# Patient Record
Sex: Male | Born: 1935 | Race: Black or African American | Hispanic: No | Marital: Married | Smoking: Former smoker
Health system: Southern US, Community
[De-identification: ages and names within clinical notes are randomized; demographics above are authoritative.]

## PROBLEM LIST (undated history)

## (undated) DIAGNOSIS — N182 Chronic kidney disease, stage 2 (mild): Secondary | ICD-10-CM

## (undated) DIAGNOSIS — M069 Rheumatoid arthritis, unspecified: Secondary | ICD-10-CM

## (undated) DIAGNOSIS — I2510 Atherosclerotic heart disease of native coronary artery without angina pectoris: Secondary | ICD-10-CM

## (undated) DIAGNOSIS — D649 Anemia, unspecified: Secondary | ICD-10-CM

## (undated) DIAGNOSIS — I509 Heart failure, unspecified: Secondary | ICD-10-CM

## (undated) DIAGNOSIS — M899 Disorder of bone, unspecified: Secondary | ICD-10-CM

## (undated) DIAGNOSIS — I6529 Occlusion and stenosis of unspecified carotid artery: Secondary | ICD-10-CM

## (undated) DIAGNOSIS — Z8679 Personal history of other diseases of the circulatory system: Secondary | ICD-10-CM

## (undated) DIAGNOSIS — R0602 Shortness of breath: Secondary | ICD-10-CM

## (undated) DIAGNOSIS — I4891 Unspecified atrial fibrillation: Secondary | ICD-10-CM

## (undated) DIAGNOSIS — M949 Disorder of cartilage, unspecified: Secondary | ICD-10-CM

## (undated) DIAGNOSIS — E785 Hyperlipidemia, unspecified: Secondary | ICD-10-CM

## (undated) DIAGNOSIS — I739 Peripheral vascular disease, unspecified: Secondary | ICD-10-CM

## (undated) DIAGNOSIS — I70209 Unspecified atherosclerosis of native arteries of extremities, unspecified extremity: Secondary | ICD-10-CM

## (undated) DIAGNOSIS — C61 Malignant neoplasm of prostate: Secondary | ICD-10-CM

## (undated) DIAGNOSIS — I252 Old myocardial infarction: Secondary | ICD-10-CM

## (undated) DIAGNOSIS — C3490 Malignant neoplasm of unspecified part of unspecified bronchus or lung: Secondary | ICD-10-CM

## (undated) DIAGNOSIS — I639 Cerebral infarction, unspecified: Secondary | ICD-10-CM

## (undated) DIAGNOSIS — Z86010 Personal history of colonic polyps: Secondary | ICD-10-CM

## (undated) HISTORY — DX: Peripheral vascular disease, unspecified: I73.9

## (undated) HISTORY — DX: Chronic kidney disease, stage 2 (mild): N18.2

## (undated) HISTORY — DX: Hyperlipidemia, unspecified: E78.5

## (undated) HISTORY — DX: Unspecified atrial fibrillation: I48.91

## (undated) HISTORY — PX: EMBOLECTOMY: SHX44

## (undated) HISTORY — DX: Personal history of other diseases of the circulatory system: Z86.79

## (undated) HISTORY — PX: CARDIAC ELECTROPHYSIOLOGY STUDY AND ABLATION: SHX1294

## (undated) HISTORY — DX: Cerebral infarction, unspecified: I63.9

## (undated) HISTORY — PX: COLONOSCOPY W/ BIOPSIES AND POLYPECTOMY: SHX1376

## (undated) HISTORY — DX: Malignant neoplasm of unspecified part of unspecified bronchus or lung: C34.90

## (undated) HISTORY — DX: Occlusion and stenosis of unspecified carotid artery: I65.29

## (undated) HISTORY — DX: Unspecified atherosclerosis of native arteries of extremities, unspecified extremity: I70.209

## (undated) HISTORY — DX: Anemia, unspecified: D64.9

## (undated) HISTORY — DX: Disorder of cartilage, unspecified: M94.9

## (undated) HISTORY — DX: Atherosclerotic heart disease of native coronary artery without angina pectoris: I25.10

## (undated) HISTORY — DX: Heart failure, unspecified: I50.9

## (undated) HISTORY — DX: Personal history of colonic polyps: Z86.010

## (undated) HISTORY — DX: Old myocardial infarction: I25.2

## (undated) HISTORY — DX: Disorder of bone, unspecified: M89.9

## (undated) HISTORY — DX: Rheumatoid arthritis, unspecified: M06.9

## (undated) HISTORY — PX: EYE SURGERY: SHX253

## (undated) HISTORY — DX: Malignant neoplasm of prostate: C61

---

## 1993-06-09 HISTORY — PX: ANGIOPLASTY: SHX39

## 2002-01-28 ENCOUNTER — Encounter: Payer: Self-pay | Admitting: *Deleted

## 2002-01-28 ENCOUNTER — Inpatient Hospital Stay (HOSPITAL_COMMUNITY): Admission: AD | Admit: 2002-01-28 | Discharge: 2002-02-05 | Payer: Self-pay | Admitting: *Deleted

## 2002-01-31 ENCOUNTER — Encounter: Payer: Self-pay | Admitting: Surgery

## 2002-01-31 HISTORY — PX: CORONARY ARTERY BYPASS GRAFT: SHX141

## 2002-02-01 ENCOUNTER — Encounter: Payer: Self-pay | Admitting: Surgery

## 2002-02-01 ENCOUNTER — Encounter: Payer: Self-pay | Admitting: Cardiology

## 2002-02-02 ENCOUNTER — Encounter: Payer: Self-pay | Admitting: Surgery

## 2002-02-10 ENCOUNTER — Encounter: Payer: Self-pay | Admitting: Emergency Medicine

## 2002-02-10 ENCOUNTER — Encounter (INDEPENDENT_AMBULATORY_CARE_PROVIDER_SITE_OTHER): Payer: Self-pay | Admitting: Specialist

## 2002-02-10 ENCOUNTER — Inpatient Hospital Stay (HOSPITAL_COMMUNITY): Admission: EM | Admit: 2002-02-10 | Discharge: 2002-02-20 | Payer: Self-pay | Admitting: Emergency Medicine

## 2002-02-11 ENCOUNTER — Encounter: Payer: Self-pay | Admitting: Internal Medicine

## 2002-02-15 ENCOUNTER — Encounter: Payer: Self-pay | Admitting: Vascular Surgery

## 2002-02-22 ENCOUNTER — Encounter: Payer: Self-pay | Admitting: Surgery

## 2002-02-22 ENCOUNTER — Encounter: Admission: RE | Admit: 2002-02-22 | Discharge: 2002-02-22 | Payer: Self-pay | Admitting: Surgery

## 2003-07-04 ENCOUNTER — Encounter: Payer: Self-pay | Admitting: Internal Medicine

## 2003-07-04 DIAGNOSIS — Z860100 Personal history of colon polyps, unspecified: Secondary | ICD-10-CM

## 2003-07-04 HISTORY — DX: Personal history of colonic polyps: Z86.010

## 2003-07-04 HISTORY — DX: Personal history of colon polyps, unspecified: Z86.0100

## 2004-04-09 ENCOUNTER — Ambulatory Visit: Payer: Self-pay

## 2004-04-30 ENCOUNTER — Ambulatory Visit: Payer: Self-pay | Admitting: Internal Medicine

## 2004-05-28 ENCOUNTER — Ambulatory Visit: Payer: Self-pay | Admitting: Internal Medicine

## 2004-06-25 ENCOUNTER — Ambulatory Visit: Payer: Self-pay | Admitting: Internal Medicine

## 2004-07-11 ENCOUNTER — Ambulatory Visit: Payer: Self-pay

## 2004-07-22 ENCOUNTER — Ambulatory Visit: Payer: Self-pay

## 2004-07-23 ENCOUNTER — Ambulatory Visit: Payer: Self-pay | Admitting: Cardiology

## 2004-07-30 ENCOUNTER — Ambulatory Visit: Payer: Self-pay | Admitting: *Deleted

## 2004-08-14 ENCOUNTER — Ambulatory Visit: Payer: Self-pay | Admitting: *Deleted

## 2004-08-14 ENCOUNTER — Ambulatory Visit: Payer: Self-pay | Admitting: Cardiology

## 2004-08-20 ENCOUNTER — Ambulatory Visit: Payer: Self-pay

## 2004-08-26 ENCOUNTER — Ambulatory Visit: Payer: Self-pay | Admitting: Internal Medicine

## 2004-09-05 ENCOUNTER — Ambulatory Visit: Admission: RE | Admit: 2004-09-05 | Discharge: 2004-09-18 | Payer: Self-pay | Admitting: Radiation Oncology

## 2004-09-09 ENCOUNTER — Ambulatory Visit: Payer: Self-pay | Admitting: Cardiology

## 2004-09-30 ENCOUNTER — Ambulatory Visit: Admission: RE | Admit: 2004-09-30 | Discharge: 2004-10-23 | Payer: Self-pay | Admitting: Radiation Oncology

## 2004-09-30 ENCOUNTER — Ambulatory Visit: Payer: Self-pay | Admitting: Cardiology

## 2004-10-28 ENCOUNTER — Ambulatory Visit: Payer: Self-pay | Admitting: Internal Medicine

## 2004-11-25 ENCOUNTER — Ambulatory Visit: Payer: Self-pay | Admitting: Cardiology

## 2004-12-09 ENCOUNTER — Ambulatory Visit: Payer: Self-pay | Admitting: Internal Medicine

## 2004-12-09 ENCOUNTER — Ambulatory Visit: Payer: Self-pay | Admitting: Cardiology

## 2004-12-16 ENCOUNTER — Ambulatory Visit: Payer: Self-pay | Admitting: *Deleted

## 2004-12-18 ENCOUNTER — Ambulatory Visit: Payer: Self-pay | Admitting: Internal Medicine

## 2004-12-23 ENCOUNTER — Ambulatory Visit: Payer: Self-pay | Admitting: Cardiology

## 2005-01-20 ENCOUNTER — Ambulatory Visit: Payer: Self-pay

## 2005-01-28 ENCOUNTER — Ambulatory Visit: Payer: Self-pay | Admitting: Internal Medicine

## 2005-02-03 ENCOUNTER — Ambulatory Visit: Payer: Self-pay | Admitting: Cardiology

## 2005-02-24 ENCOUNTER — Ambulatory Visit: Payer: Self-pay | Admitting: Cardiology

## 2005-03-12 ENCOUNTER — Ambulatory Visit: Payer: Self-pay | Admitting: Internal Medicine

## 2005-03-24 ENCOUNTER — Ambulatory Visit: Payer: Self-pay | Admitting: Internal Medicine

## 2005-03-24 ENCOUNTER — Ambulatory Visit: Payer: Self-pay | Admitting: Cardiology

## 2005-03-31 ENCOUNTER — Ambulatory Visit: Payer: Self-pay | Admitting: Cardiology

## 2005-04-10 ENCOUNTER — Ambulatory Visit: Payer: Self-pay | Admitting: Cardiology

## 2005-04-21 ENCOUNTER — Ambulatory Visit: Payer: Self-pay | Admitting: Cardiology

## 2005-04-23 ENCOUNTER — Ambulatory Visit: Payer: Self-pay | Admitting: Internal Medicine

## 2005-04-24 ENCOUNTER — Ambulatory Visit: Payer: Self-pay | Admitting: Family Medicine

## 2005-05-07 ENCOUNTER — Inpatient Hospital Stay (HOSPITAL_COMMUNITY): Admission: EM | Admit: 2005-05-07 | Discharge: 2005-05-14 | Payer: Self-pay | Admitting: Emergency Medicine

## 2005-05-08 ENCOUNTER — Ambulatory Visit: Payer: Self-pay | Admitting: Cardiology

## 2005-05-09 ENCOUNTER — Ambulatory Visit: Payer: Self-pay | Admitting: Internal Medicine

## 2005-05-09 ENCOUNTER — Encounter: Payer: Self-pay | Admitting: Internal Medicine

## 2005-05-15 ENCOUNTER — Ambulatory Visit: Payer: Self-pay | Admitting: Cardiology

## 2005-05-22 ENCOUNTER — Ambulatory Visit: Payer: Self-pay | Admitting: Cardiology

## 2005-06-05 ENCOUNTER — Ambulatory Visit: Payer: Self-pay | Admitting: *Deleted

## 2005-06-10 ENCOUNTER — Ambulatory Visit: Payer: Self-pay | Admitting: *Deleted

## 2005-06-18 ENCOUNTER — Ambulatory Visit: Payer: Self-pay | Admitting: Internal Medicine

## 2005-06-24 ENCOUNTER — Ambulatory Visit: Payer: Self-pay | Admitting: Internal Medicine

## 2005-07-03 ENCOUNTER — Ambulatory Visit: Payer: Self-pay | Admitting: Cardiology

## 2005-07-15 ENCOUNTER — Ambulatory Visit: Payer: Self-pay

## 2005-07-15 ENCOUNTER — Ambulatory Visit: Payer: Self-pay | Admitting: Internal Medicine

## 2005-08-12 ENCOUNTER — Ambulatory Visit: Payer: Self-pay | Admitting: Cardiology

## 2005-08-13 ENCOUNTER — Ambulatory Visit: Payer: Self-pay | Admitting: Internal Medicine

## 2005-09-09 ENCOUNTER — Ambulatory Visit: Payer: Self-pay | Admitting: Cardiology

## 2005-09-30 ENCOUNTER — Ambulatory Visit: Payer: Self-pay | Admitting: Cardiology

## 2005-10-01 ENCOUNTER — Ambulatory Visit: Payer: Self-pay | Admitting: *Deleted

## 2005-10-29 ENCOUNTER — Ambulatory Visit: Payer: Self-pay | Admitting: Internal Medicine

## 2005-10-29 ENCOUNTER — Ambulatory Visit: Payer: Self-pay | Admitting: *Deleted

## 2005-11-26 ENCOUNTER — Ambulatory Visit: Payer: Self-pay | Admitting: Cardiovascular Disease

## 2005-12-11 ENCOUNTER — Ambulatory Visit: Payer: Self-pay | Admitting: Internal Medicine

## 2005-12-11 ENCOUNTER — Ambulatory Visit: Payer: Self-pay | Admitting: *Deleted

## 2005-12-22 ENCOUNTER — Ambulatory Visit: Payer: Self-pay | Admitting: Internal Medicine

## 2005-12-26 ENCOUNTER — Ambulatory Visit: Payer: Self-pay | Admitting: Cardiology

## 2006-01-16 ENCOUNTER — Ambulatory Visit: Payer: Self-pay | Admitting: Internal Medicine

## 2006-02-13 ENCOUNTER — Ambulatory Visit: Payer: Self-pay | Admitting: Cardiology

## 2006-03-13 ENCOUNTER — Ambulatory Visit: Payer: Self-pay | Admitting: Cardiovascular Disease

## 2006-03-24 ENCOUNTER — Ambulatory Visit: Payer: Self-pay | Admitting: Internal Medicine

## 2006-04-03 ENCOUNTER — Ambulatory Visit: Payer: Self-pay | Admitting: Internal Medicine

## 2006-04-21 ENCOUNTER — Ambulatory Visit: Payer: Self-pay | Admitting: Internal Medicine

## 2006-04-21 LAB — CONVERTED CEMR LAB
Chol/HDL Ratio, serum: 3.8
Cholesterol: 310 mg/dL (ref 0–200)
HDL: 81.3 mg/dL (ref >39.0)
LDL DIRECT: 213.9 mg/dL
Triglyceride fasting, serum: 78 mg/dL (ref 0–149)
VLDL: 16 mg/dL (ref 0–40)

## 2006-05-04 ENCOUNTER — Ambulatory Visit: Payer: Self-pay | Admitting: Cardiology

## 2006-05-28 ENCOUNTER — Ambulatory Visit: Payer: Self-pay | Admitting: Cardiology

## 2006-06-09 DIAGNOSIS — I639 Cerebral infarction, unspecified: Secondary | ICD-10-CM

## 2006-06-09 HISTORY — DX: Cerebral infarction, unspecified: I63.9

## 2006-06-15 ENCOUNTER — Ambulatory Visit: Payer: Self-pay | Admitting: Internal Medicine

## 2006-06-15 LAB — CONVERTED CEMR LAB
ALT: 10 units/L (ref 0–40)
AST: 18 units/L (ref 0–37)
Albumin: 3.5 g/dL (ref 3.5–5.2)
Alkaline Phosphatase: 43 units/L (ref 39–117)
Bilirubin, Direct: 0.2 mg/dL (ref 0.0–0.3)
Chol/HDL Ratio, serum: 2.9
Cholesterol: 224 mg/dL (ref 0–200)
HDL: 76.9 mg/dL (ref >39.0)
LDL DIRECT: 127.9 mg/dL
Total Bilirubin: 0.8 mg/dL (ref 0.3–1.2)
Total Protein: 6.8 g/dL (ref 6.0–8.3)
Triglyceride fasting, serum: 58 mg/dL (ref 0–149)
VLDL: 12 mg/dL (ref 0–40)

## 2006-06-17 ENCOUNTER — Ambulatory Visit: Payer: Self-pay | Admitting: Internal Medicine

## 2006-06-17 ENCOUNTER — Ambulatory Visit: Payer: Self-pay

## 2006-06-22 ENCOUNTER — Ambulatory Visit: Payer: Self-pay | Admitting: Internal Medicine

## 2006-07-15 ENCOUNTER — Ambulatory Visit: Payer: Self-pay | Admitting: Cardiology

## 2006-07-21 ENCOUNTER — Ambulatory Visit: Payer: Self-pay | Admitting: *Deleted

## 2006-07-29 ENCOUNTER — Ambulatory Visit: Payer: Self-pay | Admitting: Cardiology

## 2006-08-19 ENCOUNTER — Ambulatory Visit: Payer: Self-pay | Admitting: Cardiology

## 2006-09-16 ENCOUNTER — Ambulatory Visit: Payer: Self-pay | Admitting: Cardiology

## 2006-10-14 ENCOUNTER — Ambulatory Visit: Payer: Self-pay | Admitting: Cardiology

## 2006-10-19 ENCOUNTER — Ambulatory Visit: Payer: Self-pay | Admitting: Internal Medicine

## 2006-11-04 ENCOUNTER — Ambulatory Visit: Payer: Self-pay | Admitting: Cardiology

## 2006-11-11 ENCOUNTER — Ambulatory Visit: Payer: Self-pay | Admitting: Internal Medicine

## 2006-11-13 DIAGNOSIS — Z8679 Personal history of other diseases of the circulatory system: Secondary | ICD-10-CM | POA: Insufficient documentation

## 2006-11-13 DIAGNOSIS — M899 Disorder of bone, unspecified: Secondary | ICD-10-CM | POA: Insufficient documentation

## 2006-11-13 DIAGNOSIS — M069 Rheumatoid arthritis, unspecified: Secondary | ICD-10-CM

## 2006-11-13 DIAGNOSIS — E785 Hyperlipidemia, unspecified: Secondary | ICD-10-CM

## 2006-11-13 DIAGNOSIS — I4891 Unspecified atrial fibrillation: Secondary | ICD-10-CM

## 2006-11-13 DIAGNOSIS — I2510 Atherosclerotic heart disease of native coronary artery without angina pectoris: Secondary | ICD-10-CM

## 2006-11-13 DIAGNOSIS — I252 Old myocardial infarction: Secondary | ICD-10-CM

## 2006-11-13 DIAGNOSIS — M949 Disorder of cartilage, unspecified: Secondary | ICD-10-CM

## 2006-11-13 HISTORY — DX: Disorder of bone, unspecified: M89.9

## 2006-11-13 HISTORY — DX: Atherosclerotic heart disease of native coronary artery without angina pectoris: I25.10

## 2006-11-13 HISTORY — DX: Personal history of other diseases of the circulatory system: Z86.79

## 2006-11-13 HISTORY — DX: Hyperlipidemia, unspecified: E78.5

## 2006-11-13 HISTORY — DX: Old myocardial infarction: I25.2

## 2006-11-13 HISTORY — DX: Unspecified atrial fibrillation: I48.91

## 2006-11-13 HISTORY — DX: Rheumatoid arthritis, unspecified: M06.9

## 2006-11-18 ENCOUNTER — Ambulatory Visit: Payer: Self-pay | Admitting: Cardiology

## 2007-01-01 ENCOUNTER — Ambulatory Visit: Payer: Self-pay | Admitting: Cardiology

## 2007-01-01 ENCOUNTER — Ambulatory Visit: Payer: Self-pay | Admitting: Cardiovascular Disease

## 2007-01-22 ENCOUNTER — Ambulatory Visit: Payer: Self-pay | Admitting: Internal Medicine

## 2007-01-22 LAB — CONVERTED CEMR LAB
ALT: 9 units/L (ref 0–53)
AST: 14 units/L (ref 0–37)
Albumin: 3.4 g/dL — ABNORMAL LOW (ref 3.5–5.2)
Alkaline Phosphatase: 36 units/L — ABNORMAL LOW (ref 39–117)
BUN: 24 mg/dL — ABNORMAL HIGH (ref 6–23)
Bilirubin, Direct: 0.1 mg/dL (ref 0.0–0.3)
CO2: 28 meq/L (ref 19–32)
Calcium: 9 mg/dL (ref 8.4–10.5)
Chloride: 109 meq/L (ref 96–112)
Cholesterol: 232 mg/dL (ref 0–200)
Creatinine, Ser: 1.6 mg/dL — ABNORMAL HIGH (ref 0.4–1.5)
Direct LDL: 125.3 mg/dL
GFR calc Af Amer: 55 mL/min
GFR calc non Af Amer: 45 mL/min
Glucose, Bld: 83 mg/dL (ref 70–99)
HDL: 83.4 mg/dL (ref >39.0)
Potassium: 4.6 meq/L (ref 3.5–5.1)
Sodium: 142 meq/L (ref 135–145)
Total Bilirubin: 0.9 mg/dL (ref 0.3–1.2)
Total CHOL/HDL Ratio: 2.8
Total Protein: 6.8 g/dL (ref 6.0–8.3)
Triglycerides: 67 mg/dL (ref 0–149)
VLDL: 13 mg/dL (ref 0–40)

## 2007-01-29 ENCOUNTER — Ambulatory Visit: Payer: Self-pay | Admitting: Cardiology

## 2007-01-29 ENCOUNTER — Ambulatory Visit: Payer: Self-pay

## 2007-02-01 ENCOUNTER — Ambulatory Visit: Payer: Self-pay | Admitting: Internal Medicine

## 2007-02-01 DIAGNOSIS — I509 Heart failure, unspecified: Secondary | ICD-10-CM

## 2007-02-01 HISTORY — DX: Heart failure, unspecified: I50.9

## 2007-02-01 LAB — CONVERTED CEMR LAB
Cholesterol, target level: 200 mg/dL
HDL goal, serum: 40 mg/dL
LDL Goal: 100 mg/dL

## 2007-02-26 ENCOUNTER — Ambulatory Visit: Payer: Self-pay | Admitting: Cardiovascular Disease

## 2007-03-16 ENCOUNTER — Ambulatory Visit: Payer: Self-pay | Admitting: Vascular Surgery

## 2007-03-26 ENCOUNTER — Ambulatory Visit: Payer: Self-pay | Admitting: Cardiovascular Disease

## 2007-04-23 ENCOUNTER — Ambulatory Visit: Payer: Self-pay | Admitting: Cardiovascular Disease

## 2007-04-27 ENCOUNTER — Ambulatory Visit: Payer: Self-pay | Admitting: Internal Medicine

## 2007-04-27 LAB — CONVERTED CEMR LAB
ALT: 14 units/L (ref 0–53)
AST: 22 units/L (ref 0–37)
Albumin: 3.7 g/dL (ref 3.5–5.2)
Alkaline Phosphatase: 42 units/L (ref 39–117)
BUN: 24 mg/dL — ABNORMAL HIGH (ref 6–23)
Basophils Absolute: 0.1 10*3/uL (ref 0.0–0.1)
Basophils Relative: 1.3 % — ABNORMAL HIGH (ref 0.0–1.0)
Bilirubin, Direct: 0.1 mg/dL (ref 0.0–0.3)
CO2: 26 meq/L (ref 19–32)
Calcium: 9.8 mg/dL (ref 8.4–10.5)
Chloride: 102 meq/L (ref 96–112)
Cholesterol: 199 mg/dL (ref 0–200)
Creatinine, Ser: 1.6 mg/dL — ABNORMAL HIGH (ref 0.4–1.5)
Eosinophils Absolute: 0.1 10*3/uL (ref 0.0–0.6)
Eosinophils Relative: 1.6 % (ref 0.0–5.0)
GFR calc Af Amer: 55 mL/min
GFR calc non Af Amer: 45 mL/min
Glucose, Bld: 80 mg/dL (ref 70–99)
HCT: 27.1 % — ABNORMAL LOW (ref 39.0–52.0)
HDL: 73.9 mg/dL (ref >39.0)
Hemoglobin: 8.7 g/dL — ABNORMAL LOW (ref 13.0–17.0)
LDL Cholesterol: 112 mg/dL — ABNORMAL HIGH (ref 0–99)
Lymphocytes Relative: 41.4 % (ref 12.0–46.0)
MCHC: 32.2 g/dL (ref 30.0–36.0)
MCV: 79.4 fL (ref 78.0–100.0)
Monocytes Absolute: 0.7 10*3/uL (ref 0.2–0.7)
Monocytes Relative: 12.7 % — ABNORMAL HIGH (ref 3.0–11.0)
Neutro Abs: 2.6 10*3/uL (ref 1.4–7.7)
Neutrophils Relative %: 43 % (ref 43.0–77.0)
Platelets: 337 10*3/uL (ref 150–400)
Potassium: 4.7 meq/L (ref 3.5–5.1)
RBC: 3.42 M/uL — ABNORMAL LOW (ref 4.22–5.81)
RDW: 16.1 % — ABNORMAL HIGH (ref 11.5–14.6)
Sodium: 138 meq/L (ref 135–145)
Total Bilirubin: 0.9 mg/dL (ref 0.3–1.2)
Total CHOL/HDL Ratio: 2.7
Total Protein: 7 g/dL (ref 6.0–8.3)
Triglycerides: 64 mg/dL (ref 0–149)
VLDL: 13 mg/dL (ref 0–40)
WBC: 5.9 10*3/uL (ref 4.5–10.5)

## 2007-04-29 ENCOUNTER — Ambulatory Visit: Payer: Self-pay | Admitting: Internal Medicine

## 2007-04-29 LAB — CONVERTED CEMR LAB
Basophils Absolute: 0 10*3/uL (ref 0.0–0.1)
Basophils Relative: 0.2 % (ref 0.0–1.0)
Eosinophils Absolute: 0 10*3/uL (ref 0.0–0.6)
Eosinophils Relative: 0.6 % (ref 0.0–5.0)
Ferritin: 13.8 ng/mL — ABNORMAL LOW (ref 22.0–322.0)
Folate: 15.2 ng/mL
HCT: 26.5 % — ABNORMAL LOW (ref 39.0–52.0)
Hemoglobin: 8.6 g/dL — ABNORMAL LOW (ref 13.0–17.0)
Iron: 26 ug/dL — ABNORMAL LOW (ref 42–165)
Lymphocytes Relative: 23.4 % (ref 12.0–46.0)
MCHC: 32.3 g/dL (ref 30.0–36.0)
MCV: 78.5 fL (ref 78.0–100.0)
Monocytes Absolute: 0.3 10*3/uL (ref 0.2–0.7)
Monocytes Relative: 5.5 % (ref 3.0–11.0)
Neutro Abs: 4.3 10*3/uL (ref 1.4–7.7)
Neutrophils Relative %: 70.3 % (ref 43.0–77.0)
Platelets: 353 10*3/uL (ref 150–400)
RBC: 3.38 M/uL — ABNORMAL LOW (ref 4.22–5.81)
RDW: 16.6 % — ABNORMAL HIGH (ref 11.5–14.6)
Transferrin: 310.5 mg/dL (ref >=212.0)
Vitamin B-12: 474 pg/mL (ref 211–911)
WBC: 6 10*3/uL (ref 4.5–10.5)

## 2007-05-21 ENCOUNTER — Ambulatory Visit: Payer: Self-pay | Admitting: Internal Medicine

## 2007-06-10 HISTORY — PX: LOBECTOMY: SHX5089

## 2007-06-10 HISTORY — PX: BILATERAL VATS ABLATION: SHX1224

## 2007-06-16 ENCOUNTER — Ambulatory Visit: Payer: Self-pay | Admitting: Cardiology

## 2007-06-16 ENCOUNTER — Ambulatory Visit: Payer: Self-pay | Admitting: Cardiovascular Disease

## 2007-07-14 ENCOUNTER — Ambulatory Visit: Payer: Self-pay | Admitting: Cardiology

## 2007-08-11 ENCOUNTER — Ambulatory Visit: Payer: Self-pay | Admitting: Internal Medicine

## 2007-09-08 ENCOUNTER — Ambulatory Visit: Payer: Self-pay | Admitting: Cardiology

## 2007-09-09 ENCOUNTER — Encounter: Payer: Self-pay | Admitting: Internal Medicine

## 2007-09-10 ENCOUNTER — Ambulatory Visit: Payer: Self-pay | Admitting: Internal Medicine

## 2007-09-10 LAB — CONVERTED CEMR LAB
Hemoglobin: 8.9 g/dL — ABNORMAL LOW (ref 13.0–17.0)
Lymphocytes Relative: 21.7 % (ref 12.0–46.0)
MCHC: 30.5 g/dL (ref 30.0–36.0)
Monocytes Relative: 5.1 % (ref 3.0–12.0)
Platelets: 286 10*3/uL (ref 150–400)
RDW: 17.4 % — ABNORMAL HIGH (ref 11.5–14.6)

## 2007-09-20 ENCOUNTER — Ambulatory Visit: Payer: Self-pay | Admitting: Internal Medicine

## 2007-09-22 LAB — CONVERTED CEMR LAB
BUN: 29 mg/dL — ABNORMAL HIGH (ref 6–23)
CO2: 29 meq/L (ref 19–32)
Calcium: 9.5 mg/dL (ref 8.4–10.5)
Glucose, Bld: 91 mg/dL (ref 70–99)
Potassium: 4.2 meq/L (ref 3.5–5.1)
Sodium: 138 meq/L (ref 135–145)

## 2007-09-25 ENCOUNTER — Encounter: Admission: RE | Admit: 2007-09-25 | Discharge: 2007-09-25 | Payer: Self-pay | Admitting: Internal Medicine

## 2007-10-06 ENCOUNTER — Ambulatory Visit: Payer: Self-pay | Admitting: Cardiovascular Disease

## 2007-10-11 ENCOUNTER — Encounter: Payer: Self-pay | Admitting: Internal Medicine

## 2007-10-13 ENCOUNTER — Ambulatory Visit: Payer: Self-pay | Admitting: Internal Medicine

## 2007-10-13 DIAGNOSIS — N182 Chronic kidney disease, stage 2 (mild): Secondary | ICD-10-CM

## 2007-10-13 DIAGNOSIS — N183 Chronic kidney disease, stage 3 unspecified: Secondary | ICD-10-CM | POA: Insufficient documentation

## 2007-10-13 HISTORY — DX: Chronic kidney disease, stage 2 (mild): N18.2

## 2007-10-14 ENCOUNTER — Telehealth: Payer: Self-pay | Admitting: Internal Medicine

## 2007-10-14 ENCOUNTER — Encounter: Payer: Self-pay | Admitting: Internal Medicine

## 2007-10-14 LAB — CONVERTED CEMR LAB
Calcium, Total (PTH): 9.4 mg/dL (ref 8.4–10.5)
PTH: 45 pg/mL (ref 14.0–72.0)

## 2007-10-19 ENCOUNTER — Ambulatory Visit: Payer: Self-pay | Admitting: Oncology

## 2007-11-02 ENCOUNTER — Encounter: Payer: Self-pay | Admitting: Internal Medicine

## 2007-11-03 ENCOUNTER — Ambulatory Visit: Payer: Self-pay | Admitting: Cardiology

## 2007-11-23 ENCOUNTER — Encounter: Payer: Self-pay | Admitting: Internal Medicine

## 2007-11-24 ENCOUNTER — Ambulatory Visit: Payer: Self-pay | Admitting: Cardiology

## 2007-12-22 ENCOUNTER — Ambulatory Visit: Payer: Self-pay | Admitting: Cardiovascular Disease

## 2008-01-04 ENCOUNTER — Ambulatory Visit: Payer: Self-pay | Admitting: Vascular Surgery

## 2008-01-10 ENCOUNTER — Ambulatory Visit (HOSPITAL_COMMUNITY): Admission: RE | Admit: 2008-01-10 | Discharge: 2008-01-10 | Payer: Self-pay | Admitting: Vascular Surgery

## 2008-01-10 ENCOUNTER — Ambulatory Visit: Payer: Self-pay | Admitting: Vascular Surgery

## 2008-01-18 ENCOUNTER — Ambulatory Visit: Payer: Self-pay | Admitting: Vascular Surgery

## 2008-01-19 ENCOUNTER — Ambulatory Visit: Payer: Self-pay | Admitting: Cardiology

## 2008-02-03 ENCOUNTER — Ambulatory Visit: Payer: Self-pay

## 2008-02-03 ENCOUNTER — Ambulatory Visit: Payer: Self-pay | Admitting: Cardiology

## 2008-02-11 ENCOUNTER — Ambulatory Visit: Payer: Self-pay | Admitting: Cardiovascular Disease

## 2008-02-17 ENCOUNTER — Ambulatory Visit: Payer: Self-pay | Admitting: Oncology

## 2008-02-18 ENCOUNTER — Encounter: Payer: Self-pay | Admitting: Internal Medicine

## 2008-02-18 ENCOUNTER — Ambulatory Visit: Payer: Self-pay

## 2008-02-18 ENCOUNTER — Ambulatory Visit: Payer: Self-pay | Admitting: Internal Medicine

## 2008-02-22 ENCOUNTER — Encounter: Payer: Self-pay | Admitting: Internal Medicine

## 2008-02-22 LAB — CBC WITH DIFFERENTIAL/PLATELET
BASO%: 1.1 % (ref 0.0–2.0)
EOS%: 2.4 % (ref 0.0–7.0)
LYMPH%: 30.8 % (ref 14.0–48.0)
MCHC: 32.9 g/dL (ref 32.0–35.9)
MCV: 88.9 fL (ref 81.6–98.0)
MONO%: 8.3 % (ref 0.0–13.0)
Platelets: 239 10*3/uL (ref 145–400)
RBC: 3.23 10*6/uL — ABNORMAL LOW (ref 4.20–5.71)
RDW: 15.8 % — ABNORMAL HIGH (ref 11.2–14.6)

## 2008-02-22 LAB — IRON AND TIBC
Iron: 70 ug/dL (ref 42–165)
UIBC: 186 ug/dL

## 2008-03-10 ENCOUNTER — Ambulatory Visit: Payer: Self-pay | Admitting: Cardiology

## 2008-03-21 ENCOUNTER — Ambulatory Visit: Payer: Self-pay | Admitting: Vascular Surgery

## 2008-03-27 ENCOUNTER — Ambulatory Visit (HOSPITAL_COMMUNITY): Admission: RE | Admit: 2008-03-27 | Discharge: 2008-03-27 | Payer: Self-pay | Admitting: Vascular Surgery

## 2008-03-29 ENCOUNTER — Encounter: Admission: RE | Admit: 2008-03-29 | Discharge: 2008-03-29 | Payer: Self-pay | Admitting: Vascular Surgery

## 2008-04-04 ENCOUNTER — Ambulatory Visit: Payer: Self-pay | Admitting: Thoracic Surgery

## 2008-04-07 ENCOUNTER — Ambulatory Visit: Payer: Self-pay | Admitting: Cardiology

## 2008-04-10 ENCOUNTER — Ambulatory Visit (HOSPITAL_COMMUNITY): Admission: RE | Admit: 2008-04-10 | Discharge: 2008-04-10 | Payer: Self-pay | Admitting: Thoracic Surgery

## 2008-04-11 ENCOUNTER — Ambulatory Visit: Payer: Self-pay | Admitting: Thoracic Surgery

## 2008-04-21 ENCOUNTER — Ambulatory Visit: Payer: Self-pay | Admitting: Cardiovascular Disease

## 2008-04-26 ENCOUNTER — Inpatient Hospital Stay (HOSPITAL_COMMUNITY): Admission: EM | Admit: 2008-04-26 | Discharge: 2008-05-01 | Payer: Self-pay | Admitting: Thoracic Surgery

## 2008-04-27 ENCOUNTER — Ambulatory Visit: Payer: Self-pay | Admitting: Vascular Surgery

## 2008-04-27 ENCOUNTER — Ambulatory Visit: Payer: Self-pay | Admitting: Thoracic Surgery

## 2008-04-27 ENCOUNTER — Encounter: Payer: Self-pay | Admitting: Thoracic Surgery

## 2008-05-05 ENCOUNTER — Ambulatory Visit: Payer: Self-pay | Admitting: Internal Medicine

## 2008-05-09 ENCOUNTER — Ambulatory Visit: Payer: Self-pay | Admitting: Vascular Surgery

## 2008-05-10 ENCOUNTER — Ambulatory Visit: Payer: Self-pay | Admitting: Internal Medicine

## 2008-05-10 ENCOUNTER — Encounter: Admission: RE | Admit: 2008-05-10 | Discharge: 2008-05-10 | Payer: Self-pay | Admitting: Thoracic Surgery

## 2008-05-10 ENCOUNTER — Ambulatory Visit: Payer: Self-pay | Admitting: Thoracic Surgery

## 2008-05-19 ENCOUNTER — Ambulatory Visit: Payer: Self-pay | Admitting: Oncology

## 2008-05-23 ENCOUNTER — Encounter: Payer: Self-pay | Admitting: Internal Medicine

## 2008-05-23 LAB — IRON AND TIBC
%SAT: 36 % (ref 20–55)
Iron: 84 ug/dL (ref 42–165)

## 2008-05-23 LAB — COMPREHENSIVE METABOLIC PANEL
ALT: 12 U/L (ref 0–53)
Alkaline Phosphatase: 71 U/L (ref 39–117)
Glucose, Bld: 107 mg/dL — ABNORMAL HIGH (ref 70–99)
Sodium: 137 mEq/L (ref 135–145)
Total Bilirubin: 0.5 mg/dL (ref 0.3–1.2)
Total Protein: 7 g/dL (ref 6.0–8.3)

## 2008-05-23 LAB — CBC WITH DIFFERENTIAL/PLATELET
EOS%: 1.8 % (ref 0.0–7.0)
HCT: 30.1 % — ABNORMAL LOW (ref 38.7–49.9)
LYMPH%: 23.6 % (ref 14.0–48.0)
MCHC: 32.2 g/dL (ref 32.0–35.9)
MONO#: 0.6 10*3/uL (ref 0.1–0.9)
MONO%: 7.9 % (ref 0.0–13.0)
NEUT%: 65.9 % (ref 40.0–75.0)
WBC: 7.4 10*3/uL (ref 4.0–10.0)
lymph#: 1.8 10*3/uL (ref 0.9–3.3)

## 2008-05-24 ENCOUNTER — Ambulatory Visit: Payer: Self-pay | Admitting: Thoracic Surgery

## 2008-05-24 ENCOUNTER — Ambulatory Visit: Payer: Self-pay | Admitting: Cardiology

## 2008-05-24 ENCOUNTER — Encounter: Admission: RE | Admit: 2008-05-24 | Discharge: 2008-05-24 | Payer: Self-pay | Admitting: Thoracic Surgery

## 2008-05-31 ENCOUNTER — Ambulatory Visit: Payer: Self-pay | Admitting: Cardiovascular Disease

## 2008-06-06 ENCOUNTER — Ambulatory Visit: Payer: Self-pay | Admitting: Vascular Surgery

## 2008-06-21 ENCOUNTER — Inpatient Hospital Stay (HOSPITAL_COMMUNITY): Admission: RE | Admit: 2008-06-21 | Discharge: 2008-06-26 | Payer: Self-pay | Admitting: Vascular Surgery

## 2008-06-21 ENCOUNTER — Ambulatory Visit: Payer: Self-pay | Admitting: Vascular Surgery

## 2008-06-21 HISTORY — PX: FEMORAL ARTERY - POPLITEAL ARTERY BYPASS GRAFT: SUR180

## 2008-06-22 ENCOUNTER — Encounter: Payer: Self-pay | Admitting: Vascular Surgery

## 2008-06-29 ENCOUNTER — Ambulatory Visit: Payer: Self-pay | Admitting: Cardiology

## 2008-07-07 ENCOUNTER — Ambulatory Visit: Payer: Self-pay | Admitting: Cardiovascular Disease

## 2008-07-11 ENCOUNTER — Ambulatory Visit: Payer: Self-pay | Admitting: Vascular Surgery

## 2008-07-19 ENCOUNTER — Ambulatory Visit: Payer: Self-pay | Admitting: Thoracic Surgery

## 2008-07-20 ENCOUNTER — Ambulatory Visit: Payer: Self-pay | Admitting: Internal Medicine

## 2008-07-28 ENCOUNTER — Ambulatory Visit: Payer: Self-pay | Admitting: Cardiovascular Disease

## 2008-08-11 ENCOUNTER — Ambulatory Visit: Payer: Self-pay | Admitting: Internal Medicine

## 2008-08-21 ENCOUNTER — Ambulatory Visit: Payer: Self-pay | Admitting: Internal Medicine

## 2008-08-21 DIAGNOSIS — C3490 Malignant neoplasm of unspecified part of unspecified bronchus or lung: Secondary | ICD-10-CM | POA: Insufficient documentation

## 2008-08-21 HISTORY — DX: Malignant neoplasm of unspecified part of unspecified bronchus or lung: C34.90

## 2008-08-22 ENCOUNTER — Ambulatory Visit: Payer: Self-pay | Admitting: Vascular Surgery

## 2008-08-22 ENCOUNTER — Encounter: Payer: Self-pay | Admitting: Internal Medicine

## 2008-08-22 LAB — CBC WITH DIFFERENTIAL/PLATELET
EOS%: 2.5 % (ref 0.0–7.0)
Eosinophils Absolute: 0.2 10*3/uL (ref 0.0–0.5)
LYMPH%: 31.5 % (ref 14.0–49.0)
MCH: 29.5 pg (ref 27.2–33.4)
MCV: 91 fL (ref 79.3–98.0)
MONO%: 12.5 % (ref 0.0–14.0)
NEUT#: 3.2 10*3/uL (ref 1.5–6.5)
Platelets: 327 10*3/uL (ref 140–400)
RBC: 3.8 10*6/uL — ABNORMAL LOW (ref 4.20–5.82)
RDW: 14.3 % (ref 11.0–14.6)

## 2008-08-22 LAB — COMPREHENSIVE METABOLIC PANEL
AST: 11 U/L (ref 0–37)
Alkaline Phosphatase: 70 U/L (ref 39–117)
BUN: 21 mg/dL (ref 6–23)
Glucose, Bld: 112 mg/dL — ABNORMAL HIGH (ref 70–99)
Potassium: 4.3 mEq/L (ref 3.5–5.3)
Sodium: 140 mEq/L (ref 135–145)
Total Bilirubin: 0.5 mg/dL (ref 0.3–1.2)
Total Protein: 7.1 g/dL (ref 6.0–8.3)

## 2008-08-25 ENCOUNTER — Ambulatory Visit: Payer: Self-pay | Admitting: Cardiology

## 2008-08-30 ENCOUNTER — Ambulatory Visit (HOSPITAL_COMMUNITY): Admission: RE | Admit: 2008-08-30 | Discharge: 2008-08-30 | Payer: Self-pay | Admitting: Oncology

## 2008-09-07 ENCOUNTER — Encounter: Payer: Self-pay | Admitting: Cardiovascular Disease

## 2008-09-07 ENCOUNTER — Ambulatory Visit: Payer: Self-pay | Admitting: Cardiovascular Disease

## 2008-09-07 DIAGNOSIS — I6529 Occlusion and stenosis of unspecified carotid artery: Secondary | ICD-10-CM

## 2008-09-07 HISTORY — DX: Occlusion and stenosis of unspecified carotid artery: I65.29

## 2008-09-12 ENCOUNTER — Ambulatory Visit: Payer: Self-pay | Admitting: Vascular Surgery

## 2008-09-20 ENCOUNTER — Ambulatory Visit: Payer: Self-pay | Admitting: Thoracic Surgery

## 2008-09-20 ENCOUNTER — Encounter: Admission: RE | Admit: 2008-09-20 | Discharge: 2008-09-20 | Payer: Self-pay | Admitting: Thoracic Surgery

## 2008-09-21 ENCOUNTER — Ambulatory Visit: Payer: Self-pay | Admitting: Internal Medicine

## 2008-10-19 ENCOUNTER — Ambulatory Visit: Payer: Self-pay | Admitting: Cardiovascular Disease

## 2008-10-19 ENCOUNTER — Encounter: Payer: Self-pay | Admitting: Internal Medicine

## 2008-10-26 ENCOUNTER — Ambulatory Visit: Payer: Self-pay | Admitting: Internal Medicine

## 2008-11-07 ENCOUNTER — Encounter: Payer: Self-pay | Admitting: *Deleted

## 2008-11-20 ENCOUNTER — Ambulatory Visit: Payer: Self-pay | Admitting: Internal Medicine

## 2008-11-22 ENCOUNTER — Ambulatory Visit: Payer: Self-pay | Admitting: Cardiology

## 2008-11-22 ENCOUNTER — Encounter: Payer: Self-pay | Admitting: Internal Medicine

## 2008-11-22 LAB — COMPREHENSIVE METABOLIC PANEL
AST: 10 U/L (ref 0–37)
Albumin: 3.7 g/dL (ref 3.5–5.2)
BUN: 20 mg/dL (ref 6–23)
CO2: 23 mEq/L (ref 19–32)
Calcium: 9.4 mg/dL (ref 8.4–10.5)
Chloride: 101 mEq/L (ref 96–112)
Creatinine, Ser: 1.41 mg/dL (ref 0.40–1.50)
Glucose, Bld: 103 mg/dL — ABNORMAL HIGH (ref 70–99)
Potassium: 4.3 mEq/L (ref 3.5–5.3)

## 2008-11-22 LAB — CBC WITH DIFFERENTIAL/PLATELET
Basophils Absolute: 0.1 10*3/uL (ref 0.0–0.1)
EOS%: 4.3 % (ref 0.0–7.0)
Eosinophils Absolute: 0.2 10*3/uL (ref 0.0–0.5)
HCT: 27.7 % — ABNORMAL LOW (ref 38.4–49.9)
HGB: 9.3 g/dL — ABNORMAL LOW (ref 13.0–17.1)
MCH: 29.3 pg (ref 27.2–33.4)
MONO#: 0.7 10*3/uL (ref 0.1–0.9)
NEUT#: 3 10*3/uL (ref 1.5–6.5)
NEUT%: 52.9 % (ref 39.0–75.0)
RDW: 15.4 % — ABNORMAL HIGH (ref 11.0–14.6)
lymph#: 1.6 10*3/uL (ref 0.9–3.3)

## 2008-11-22 LAB — IRON AND TIBC
%SAT: 21 % (ref 20–55)
Iron: 46 ug/dL (ref 42–165)
TIBC: 220 ug/dL (ref 215–435)
UIBC: 174 ug/dL

## 2008-11-22 LAB — CONVERTED CEMR LAB
POC INR: 2.9
Protime: 20.7

## 2008-11-24 ENCOUNTER — Ambulatory Visit (HOSPITAL_COMMUNITY): Admission: RE | Admit: 2008-11-24 | Discharge: 2008-11-24 | Payer: Self-pay | Admitting: Oncology

## 2008-11-29 ENCOUNTER — Ambulatory Visit: Payer: Self-pay | Admitting: Thoracic Surgery

## 2008-12-13 ENCOUNTER — Encounter: Payer: Self-pay | Admitting: Oncology

## 2008-12-13 ENCOUNTER — Ambulatory Visit: Payer: Self-pay | Admitting: Surgery

## 2008-12-13 ENCOUNTER — Encounter: Payer: Self-pay | Admitting: *Deleted

## 2008-12-13 ENCOUNTER — Ambulatory Visit: Admission: RE | Admit: 2008-12-13 | Discharge: 2008-12-13 | Payer: Self-pay | Admitting: Oncology

## 2008-12-13 ENCOUNTER — Encounter: Payer: Self-pay | Admitting: Internal Medicine

## 2008-12-13 LAB — CBC WITH DIFFERENTIAL/PLATELET
BASO%: 1.3 % (ref 0.0–2.0)
Basophils Absolute: 0.1 10*3/uL (ref 0.0–0.1)
Eosinophils Absolute: 0.2 10*3/uL (ref 0.0–0.5)
HCT: 24.9 % — ABNORMAL LOW (ref 38.4–49.9)
HGB: 8.3 g/dL — ABNORMAL LOW (ref 13.0–17.1)
LYMPH%: 20.9 % (ref 14.0–49.0)
MONO#: 1.3 10*3/uL — ABNORMAL HIGH (ref 0.1–0.9)
NEUT%: 60.6 % (ref 39.0–75.0)
Platelets: 351 10*3/uL (ref 140–400)
WBC: 8.6 10*3/uL (ref 4.0–10.3)
lymph#: 1.8 10*3/uL (ref 0.9–3.3)

## 2008-12-13 LAB — IRON AND TIBC
%SAT: 6 % — ABNORMAL LOW (ref 20–55)
TIBC: 230 ug/dL (ref 215–435)

## 2008-12-13 LAB — FERRITIN: Ferritin: 322 ng/mL (ref 22–322)

## 2008-12-18 ENCOUNTER — Ambulatory Visit: Payer: Self-pay | Admitting: Internal Medicine

## 2008-12-20 ENCOUNTER — Ambulatory Visit: Payer: Self-pay | Admitting: Internal Medicine

## 2008-12-20 LAB — CBC WITH DIFFERENTIAL/PLATELET
Eosinophils Absolute: 0.1 10*3/uL (ref 0.0–0.5)
MCV: 89.4 fL (ref 79.3–98.0)
MONO%: 13.7 % (ref 0.0–14.0)
NEUT#: 4.1 10*3/uL (ref 1.5–6.5)
RBC: 2.92 10*6/uL — ABNORMAL LOW (ref 4.20–5.82)
RDW: 17.1 % — ABNORMAL HIGH (ref 11.0–14.6)
WBC: 7.1 10*3/uL (ref 4.0–10.3)
lymph#: 1.8 10*3/uL (ref 0.9–3.3)

## 2008-12-20 LAB — CONVERTED CEMR LAB: Prothrombin Time: 25.9 s

## 2008-12-27 LAB — CBC WITH DIFFERENTIAL/PLATELET
Eosinophils Absolute: 0.1 10*3/uL (ref 0.0–0.5)
HGB: 9.3 g/dL — ABNORMAL LOW (ref 13.0–17.1)
LYMPH%: 27.7 % (ref 14.0–49.0)
MONO#: 1 10*3/uL — ABNORMAL HIGH (ref 0.1–0.9)
NEUT#: 4.4 10*3/uL (ref 1.5–6.5)
Platelets: 466 10*3/uL — ABNORMAL HIGH (ref 140–400)
RBC: 3.14 10*6/uL — ABNORMAL LOW (ref 4.20–5.82)
RDW: 18.7 % — ABNORMAL HIGH (ref 11.0–14.6)
WBC: 7.7 10*3/uL (ref 4.0–10.3)

## 2009-01-02 ENCOUNTER — Ambulatory Visit: Payer: Self-pay | Admitting: Vascular Surgery

## 2009-01-02 ENCOUNTER — Ambulatory Visit: Payer: Self-pay | Admitting: Internal Medicine

## 2009-01-09 ENCOUNTER — Encounter: Payer: Self-pay | Admitting: Internal Medicine

## 2009-01-09 LAB — CBC WITH DIFFERENTIAL/PLATELET
Eosinophils Absolute: 0.1 10*3/uL (ref 0.0–0.5)
LYMPH%: 26.4 % (ref 14.0–49.0)
MCHC: 32.3 g/dL (ref 32.0–36.0)
MCV: 89.5 fL (ref 79.3–98.0)
MONO%: 9.3 % (ref 0.0–14.0)
NEUT#: 4 10*3/uL (ref 1.5–6.5)
Platelets: 441 10*3/uL — ABNORMAL HIGH (ref 140–400)
RBC: 3.44 10*6/uL — ABNORMAL LOW (ref 4.20–5.82)

## 2009-01-16 ENCOUNTER — Ambulatory Visit: Payer: Self-pay | Admitting: Internal Medicine

## 2009-01-16 LAB — CONVERTED CEMR LAB: INR: 3.7

## 2009-01-30 ENCOUNTER — Ambulatory Visit: Payer: Self-pay | Admitting: Internal Medicine

## 2009-01-30 LAB — CONVERTED CEMR LAB: POC INR: 2.6

## 2009-02-05 ENCOUNTER — Ambulatory Visit: Payer: Self-pay | Admitting: Internal Medicine

## 2009-02-06 ENCOUNTER — Ambulatory Visit: Payer: Self-pay

## 2009-02-06 ENCOUNTER — Encounter: Payer: Self-pay | Admitting: Cardiovascular Disease

## 2009-02-07 ENCOUNTER — Encounter: Payer: Self-pay | Admitting: Internal Medicine

## 2009-02-07 LAB — CBC WITH DIFFERENTIAL/PLATELET
Basophils Absolute: 0.1 10*3/uL (ref 0.0–0.1)
Eosinophils Absolute: 0.1 10*3/uL (ref 0.0–0.5)
LYMPH%: 33.4 % (ref 14.0–49.0)
MCH: 28.9 pg (ref 27.2–33.4)
MCV: 88.9 fL (ref 79.3–98.0)
MONO%: 14 % (ref 0.0–14.0)
NEUT#: 3 10*3/uL (ref 1.5–6.5)
Platelets: 336 10*3/uL (ref 140–400)
RBC: 3.57 10*6/uL — ABNORMAL LOW (ref 4.20–5.82)

## 2009-02-07 LAB — COMPREHENSIVE METABOLIC PANEL
Alkaline Phosphatase: 55 U/L (ref 39–117)
BUN: 33 mg/dL — ABNORMAL HIGH (ref 6–23)
Glucose, Bld: 89 mg/dL (ref 70–99)
Sodium: 137 mEq/L (ref 135–145)
Total Bilirubin: 0.6 mg/dL (ref 0.3–1.2)

## 2009-02-16 ENCOUNTER — Ambulatory Visit: Payer: Self-pay | Admitting: Internal Medicine

## 2009-02-20 ENCOUNTER — Ambulatory Visit: Payer: Self-pay | Admitting: Cardiology

## 2009-02-20 LAB — CONVERTED CEMR LAB: POC INR: 2

## 2009-03-07 ENCOUNTER — Encounter: Admission: RE | Admit: 2009-03-07 | Discharge: 2009-03-07 | Payer: Self-pay | Admitting: Thoracic Surgery

## 2009-03-07 ENCOUNTER — Encounter (INDEPENDENT_AMBULATORY_CARE_PROVIDER_SITE_OTHER): Payer: Self-pay | Admitting: *Deleted

## 2009-03-07 ENCOUNTER — Ambulatory Visit: Payer: Self-pay | Admitting: Oncology

## 2009-03-07 ENCOUNTER — Ambulatory Visit: Payer: Self-pay | Admitting: Thoracic Surgery

## 2009-03-07 LAB — CBC WITH DIFFERENTIAL/PLATELET
Basophils Absolute: 0 10*3/uL (ref 0.0–0.1)
Eosinophils Absolute: 0.2 10*3/uL (ref 0.0–0.5)
HGB: 11.1 g/dL — ABNORMAL LOW (ref 13.0–17.1)
LYMPH%: 31.3 % (ref 14.0–49.0)
MCV: 88.2 fL (ref 79.3–98.0)
MONO%: 14 % (ref 0.0–14.0)
NEUT#: 3.2 10*3/uL (ref 1.5–6.5)
Platelets: 329 10*3/uL (ref 140–400)

## 2009-03-14 ENCOUNTER — Encounter (INDEPENDENT_AMBULATORY_CARE_PROVIDER_SITE_OTHER): Payer: Self-pay | Admitting: *Deleted

## 2009-03-20 ENCOUNTER — Ambulatory Visit: Payer: Self-pay | Admitting: Cardiology

## 2009-03-20 LAB — CONVERTED CEMR LAB: POC INR: 1.9

## 2009-03-22 ENCOUNTER — Ambulatory Visit: Payer: Self-pay | Admitting: Vascular Surgery

## 2009-04-02 ENCOUNTER — Ambulatory Visit: Payer: Self-pay | Admitting: Oncology

## 2009-04-04 LAB — CBC WITH DIFFERENTIAL/PLATELET
Basophils Absolute: 0 10*3/uL (ref 0.0–0.1)
Eosinophils Absolute: 0.2 10*3/uL (ref 0.0–0.5)
HCT: 37.7 % — ABNORMAL LOW (ref 38.4–49.9)
HGB: 12 g/dL — ABNORMAL LOW (ref 13.0–17.1)
MCV: 89.5 fL (ref 79.3–98.0)
MONO%: 9.3 % (ref 0.0–14.0)
NEUT#: 3.1 10*3/uL (ref 1.5–6.5)
NEUT%: 59 % (ref 39.0–75.0)
Platelets: 272 10*3/uL (ref 140–400)
RDW: 16.3 % — ABNORMAL HIGH (ref 11.0–14.6)

## 2009-04-06 ENCOUNTER — Encounter (INDEPENDENT_AMBULATORY_CARE_PROVIDER_SITE_OTHER): Payer: Self-pay | Admitting: *Deleted

## 2009-04-10 ENCOUNTER — Ambulatory Visit: Payer: Self-pay | Admitting: Cardiovascular Disease

## 2009-04-10 ENCOUNTER — Ambulatory Visit: Payer: Self-pay | Admitting: Internal Medicine

## 2009-04-10 LAB — CONVERTED CEMR LAB: POC INR: 2.1

## 2009-04-12 ENCOUNTER — Ambulatory Visit: Payer: Self-pay | Admitting: Cardiovascular Disease

## 2009-04-12 LAB — CONVERTED CEMR LAB
ALT: 9 units/L (ref 0–53)
AST: 13 units/L (ref 0–37)
Alkaline Phosphatase: 66 units/L (ref 39–117)
Bilirubin, Direct: 0.1 mg/dL (ref 0.0–0.3)
Total Bilirubin: 0.9 mg/dL (ref 0.3–1.2)

## 2009-04-19 ENCOUNTER — Encounter: Payer: Self-pay | Admitting: Internal Medicine

## 2009-04-27 ENCOUNTER — Ambulatory Visit: Payer: Self-pay | Admitting: Oncology

## 2009-05-02 ENCOUNTER — Encounter: Payer: Self-pay | Admitting: Internal Medicine

## 2009-05-02 LAB — CBC WITH DIFFERENTIAL/PLATELET
BASO%: 3.4 % — ABNORMAL HIGH (ref 0.0–2.0)
Basophils Absolute: 0.2 10*3/uL — ABNORMAL HIGH (ref 0.0–0.1)
Eosinophils Absolute: 0.4 10*3/uL (ref 0.0–0.5)
HCT: 32 % — ABNORMAL LOW (ref 38.4–49.9)
HGB: 10.1 g/dL — ABNORMAL LOW (ref 13.0–17.1)
LYMPH%: 30.9 % (ref 14.0–49.0)
MONO#: 1 10*3/uL — ABNORMAL HIGH (ref 0.1–0.9)
NEUT#: 1.9 10*3/uL (ref 1.5–6.5)
NEUT%: 37.1 % — ABNORMAL LOW (ref 39.0–75.0)
Platelets: 286 10*3/uL (ref 140–400)
WBC: 5 10*3/uL (ref 4.0–10.3)
lymph#: 1.5 10*3/uL (ref 0.9–3.3)

## 2009-05-08 ENCOUNTER — Ambulatory Visit: Payer: Self-pay | Admitting: Cardiology

## 2009-05-23 ENCOUNTER — Ambulatory Visit (HOSPITAL_COMMUNITY): Admission: RE | Admit: 2009-05-23 | Discharge: 2009-05-23 | Payer: Self-pay | Admitting: Oncology

## 2009-05-25 ENCOUNTER — Ambulatory Visit: Payer: Self-pay | Admitting: Oncology

## 2009-05-29 ENCOUNTER — Encounter: Payer: Self-pay | Admitting: Internal Medicine

## 2009-05-29 LAB — CBC WITH DIFFERENTIAL/PLATELET
BASO%: 1.6 % (ref 0.0–2.0)
Basophils Absolute: 0.1 10*3/uL (ref 0.0–0.1)
HCT: 33.5 % — ABNORMAL LOW (ref 38.4–49.9)
HGB: 10.8 g/dL — ABNORMAL LOW (ref 13.0–17.1)
MCHC: 32.1 g/dL (ref 32.0–36.0)
MONO#: 1.1 10*3/uL — ABNORMAL HIGH (ref 0.1–0.9)
NEUT%: 39.8 % (ref 39.0–75.0)
WBC: 6 10*3/uL (ref 4.0–10.3)
lymph#: 1.8 10*3/uL (ref 0.9–3.3)

## 2009-05-29 LAB — COMPREHENSIVE METABOLIC PANEL
ALT: <8 U/L (ref 0–53)
Albumin: 3.9 g/dL (ref 3.5–5.2)
CO2: 23 mEq/L (ref 19–32)
Calcium: 9.8 mg/dL (ref 8.4–10.5)
Chloride: 103 mEq/L (ref 96–112)
Creatinine, Ser: 1.27 mg/dL (ref 0.40–1.50)

## 2009-06-05 ENCOUNTER — Encounter (INDEPENDENT_AMBULATORY_CARE_PROVIDER_SITE_OTHER): Payer: Self-pay | Admitting: Cardiology

## 2009-06-05 ENCOUNTER — Ambulatory Visit: Payer: Self-pay | Admitting: Cardiology

## 2009-06-05 LAB — CONVERTED CEMR LAB: POC INR: 3.4

## 2009-06-25 ENCOUNTER — Ambulatory Visit: Payer: Self-pay | Admitting: Cardiology

## 2009-06-25 ENCOUNTER — Ambulatory Visit: Payer: Self-pay | Admitting: Oncology

## 2009-06-27 LAB — CBC WITH DIFFERENTIAL/PLATELET
Eosinophils Absolute: 0.5 10*3/uL (ref 0.0–0.5)
HCT: 33.6 % — ABNORMAL LOW (ref 38.4–49.9)
LYMPH%: 30.8 % (ref 14.0–49.0)
MCV: 87.5 fL (ref 79.3–98.0)
MONO%: 22.1 % — ABNORMAL HIGH (ref 0.0–14.0)
NEUT#: 1.8 10*3/uL (ref 1.5–6.5)
NEUT%: 34.8 % — ABNORMAL LOW (ref 39.0–75.0)
Platelets: 277 10*3/uL (ref 140–400)
RBC: 3.84 10*6/uL — ABNORMAL LOW (ref 4.20–5.82)
nRBC: 0 % (ref 0–0)

## 2009-07-06 ENCOUNTER — Ambulatory Visit: Payer: Self-pay | Admitting: Vascular Surgery

## 2009-07-16 ENCOUNTER — Ambulatory Visit: Payer: Self-pay | Admitting: Internal Medicine

## 2009-07-16 ENCOUNTER — Ambulatory Visit: Payer: Self-pay | Admitting: Cardiovascular Disease

## 2009-07-17 LAB — CONVERTED CEMR LAB
Basophils Absolute: 0 10*3/uL (ref 0.0–0.1)
Bilirubin, Direct: 0.1 mg/dL (ref 0.0–0.3)
CO2: 29 meq/L (ref 19–32)
Chloride: 102 meq/L (ref 96–112)
GFR calc non Af Amer: 69.09 mL/min (ref >60)
Glucose, Bld: 84 mg/dL (ref 70–99)
HCT: 35.1 % — ABNORMAL LOW (ref 39.0–52.0)
Hemoglobin: 10.9 g/dL — ABNORMAL LOW (ref 13.0–17.0)
LDL Cholesterol: 82 mg/dL (ref 0–99)
Lymphs Abs: 1.6 10*3/uL (ref 0.7–4.0)
MCV: 89.6 fL (ref 78.0–100.0)
Monocytes Relative: 13.4 % — ABNORMAL HIGH (ref 3.0–12.0)
Neutro Abs: 3 10*3/uL (ref 1.4–7.7)
PSA: 5.83 ng/mL — ABNORMAL HIGH (ref 0.10–4.00)
Potassium: 4.2 meq/L (ref 3.5–5.1)
RDW: 15.3 % — ABNORMAL HIGH (ref 11.5–14.6)
Sodium: 139 meq/L (ref 135–145)
Total Bilirubin: 0.8 mg/dL (ref 0.3–1.2)
VLDL: 13.4 mg/dL (ref 0.0–40.0)

## 2009-07-18 ENCOUNTER — Encounter: Payer: Self-pay | Admitting: Internal Medicine

## 2009-07-18 LAB — CONVERTED CEMR LAB
Beta Globulin: 5.9 % (ref 4.7–7.2)
Ferritin: 27.1 ng/mL (ref 22.0–322.0)
Gamma Globulin: 23.1 % — ABNORMAL HIGH (ref 11.1–18.8)
Iron: 30 ug/dL — ABNORMAL LOW (ref 42–165)
Vitamin B-12: 473 pg/mL (ref 211–911)

## 2009-07-25 ENCOUNTER — Ambulatory Visit: Payer: Self-pay | Admitting: Oncology

## 2009-07-25 ENCOUNTER — Encounter: Payer: Self-pay | Admitting: Internal Medicine

## 2009-07-25 LAB — COMPREHENSIVE METABOLIC PANEL
Albumin: 4 g/dL (ref 3.5–5.2)
CO2: 24 mEq/L (ref 19–32)
Calcium: 9.6 mg/dL (ref 8.4–10.5)
Glucose, Bld: 84 mg/dL (ref 70–99)
Potassium: 4.5 mEq/L (ref 3.5–5.3)
Sodium: 138 mEq/L (ref 135–145)
Total Protein: 7.6 g/dL (ref 6.0–8.3)

## 2009-07-25 LAB — CBC WITH DIFFERENTIAL/PLATELET
Eosinophils Absolute: 0.5 10*3/uL (ref 0.0–0.5)
MONO#: 0.6 10*3/uL (ref 0.1–0.9)
NEUT#: 2.7 10*3/uL (ref 1.5–6.5)
Platelets: 266 10*3/uL (ref 140–400)
RBC: 3.89 10*6/uL — ABNORMAL LOW (ref 4.20–5.82)
RDW: 16.9 % — ABNORMAL HIGH (ref 11.0–14.6)
WBC: 5.4 10*3/uL (ref 4.0–10.3)

## 2009-08-05 ENCOUNTER — Telehealth: Payer: Self-pay | Admitting: Internal Medicine

## 2009-08-06 ENCOUNTER — Ambulatory Visit: Payer: Self-pay | Admitting: Cardiology

## 2009-08-06 ENCOUNTER — Encounter: Payer: Self-pay | Admitting: Internal Medicine

## 2009-08-06 LAB — CONVERTED CEMR LAB: POC INR: 1.9

## 2009-08-08 ENCOUNTER — Ambulatory Visit: Payer: Self-pay | Admitting: Internal Medicine

## 2009-08-22 ENCOUNTER — Ambulatory Visit: Payer: Self-pay | Admitting: Oncology

## 2009-08-22 LAB — CBC WITH DIFFERENTIAL/PLATELET
EOS%: 5.7 % (ref 0.0–7.0)
Eosinophils Absolute: 0.3 10*3/uL (ref 0.0–0.5)
MCV: 84.5 fL (ref 79.3–98.0)
MONO%: 15.3 % — ABNORMAL HIGH (ref 0.0–14.0)
NEUT#: 2.1 10*3/uL (ref 1.5–6.5)
RBC: 3.81 10*6/uL — ABNORMAL LOW (ref 4.20–5.82)
RDW: 16.7 % — ABNORMAL HIGH (ref 11.0–14.6)
lymph#: 1.8 10*3/uL (ref 0.9–3.3)

## 2009-09-03 ENCOUNTER — Ambulatory Visit: Payer: Self-pay | Admitting: Cardiovascular Disease

## 2009-09-05 ENCOUNTER — Ambulatory Visit: Payer: Self-pay | Admitting: Thoracic Surgery

## 2009-09-05 ENCOUNTER — Encounter: Admission: RE | Admit: 2009-09-05 | Discharge: 2009-09-05 | Payer: Self-pay | Admitting: Thoracic Surgery

## 2009-09-07 ENCOUNTER — Ambulatory Visit: Payer: Self-pay | Admitting: Internal Medicine

## 2009-09-07 DIAGNOSIS — D649 Anemia, unspecified: Secondary | ICD-10-CM | POA: Insufficient documentation

## 2009-09-07 HISTORY — PX: ESOPHAGOGASTRODUODENOSCOPY: SHX1529

## 2009-09-07 HISTORY — DX: Anemia, unspecified: D64.9

## 2009-09-17 LAB — CONVERTED CEMR LAB: PSA: 6.95 ng/mL — ABNORMAL HIGH (ref 0.10–4.00)

## 2009-09-19 ENCOUNTER — Encounter: Payer: Self-pay | Admitting: Internal Medicine

## 2009-09-19 LAB — COMPREHENSIVE METABOLIC PANEL
Albumin: 3.6 g/dL (ref 3.5–5.2)
Alkaline Phosphatase: 61 U/L (ref 39–117)
CO2: 27 mEq/L (ref 19–32)
Calcium: 9.3 mg/dL (ref 8.4–10.5)
Chloride: 103 mEq/L (ref 96–112)
Glucose, Bld: 86 mg/dL (ref 70–99)
Potassium: 4.2 mEq/L (ref 3.5–5.3)
Sodium: 138 mEq/L (ref 135–145)
Total Protein: 7.8 g/dL (ref 6.0–8.3)

## 2009-09-19 LAB — CBC WITH DIFFERENTIAL/PLATELET
Basophils Absolute: 0.2 10*3/uL — ABNORMAL HIGH (ref 0.0–0.1)
Eosinophils Absolute: 0.3 10*3/uL (ref 0.0–0.5)
HCT: 31.8 % — ABNORMAL LOW (ref 38.4–49.9)
HGB: 10 g/dL — ABNORMAL LOW (ref 13.0–17.1)
MONO#: 0.6 10*3/uL (ref 0.1–0.9)
NEUT#: 1.5 10*3/uL (ref 1.5–6.5)
NEUT%: 35.2 % — ABNORMAL LOW (ref 39.0–75.0)
RDW: 17.3 % — ABNORMAL HIGH (ref 11.0–14.6)
lymph#: 1.8 10*3/uL (ref 0.9–3.3)

## 2009-09-26 ENCOUNTER — Ambulatory Visit: Payer: Self-pay | Admitting: Internal Medicine

## 2009-09-29 ENCOUNTER — Encounter: Payer: Self-pay | Admitting: Internal Medicine

## 2009-10-01 ENCOUNTER — Ambulatory Visit: Payer: Self-pay | Admitting: Cardiovascular Disease

## 2009-10-15 ENCOUNTER — Ambulatory Visit: Payer: Self-pay | Admitting: Cardiology

## 2009-10-17 ENCOUNTER — Ambulatory Visit: Payer: Self-pay | Admitting: Oncology

## 2009-10-17 LAB — CBC WITH DIFFERENTIAL/PLATELET
BASO%: 2.5 % — ABNORMAL HIGH (ref 0.0–2.0)
EOS%: 4.4 % (ref 0.0–7.0)
HCT: 34.4 % — ABNORMAL LOW (ref 38.4–49.9)
HGB: 10.8 g/dL — ABNORMAL LOW (ref 13.0–17.1)
MCH: 26.3 pg — ABNORMAL LOW (ref 27.2–33.4)
MCHC: 31.3 g/dL — ABNORMAL LOW (ref 32.0–36.0)
MONO#: 0.7 10*3/uL (ref 0.1–0.9)
RDW: 19.6 % — ABNORMAL HIGH (ref 11.0–14.6)
WBC: 4.4 10*3/uL (ref 4.0–10.3)
lymph#: 1.7 10*3/uL (ref 0.9–3.3)

## 2009-10-18 ENCOUNTER — Encounter: Payer: Self-pay | Admitting: Internal Medicine

## 2009-10-24 ENCOUNTER — Ambulatory Visit: Payer: Self-pay | Admitting: Internal Medicine

## 2009-10-24 DIAGNOSIS — C61 Malignant neoplasm of prostate: Secondary | ICD-10-CM

## 2009-10-24 HISTORY — DX: Malignant neoplasm of prostate: C61

## 2009-10-29 ENCOUNTER — Ambulatory Visit: Payer: Self-pay | Admitting: Cardiology

## 2009-11-12 ENCOUNTER — Ambulatory Visit (HOSPITAL_COMMUNITY): Admission: RE | Admit: 2009-11-12 | Discharge: 2009-11-12 | Payer: Self-pay | Admitting: Oncology

## 2009-11-12 LAB — CBC WITH DIFFERENTIAL/PLATELET
BASO%: 3.8 % — ABNORMAL HIGH (ref 0.0–2.0)
Eosinophils Absolute: 0.2 10*3/uL (ref 0.0–0.5)
HCT: 36.8 % — ABNORMAL LOW (ref 38.4–49.9)
HGB: 11.5 g/dL — ABNORMAL LOW (ref 13.0–17.1)
LYMPH%: 36.7 % (ref 14.0–49.0)
MONO#: 0.6 10*3/uL (ref 0.1–0.9)
NEUT#: 1.7 10*3/uL (ref 1.5–6.5)
NEUT%: 40.5 % (ref 39.0–75.0)
Platelets: 250 10*3/uL (ref 140–400)
WBC: 4.2 10*3/uL (ref 4.0–10.3)
lymph#: 1.6 10*3/uL (ref 0.9–3.3)

## 2009-11-12 LAB — COMPREHENSIVE METABOLIC PANEL
ALT: 16 U/L (ref 0–53)
AST: 21 U/L (ref 0–37)
Albumin: 3.9 g/dL (ref 3.5–5.2)
BUN: 23 mg/dL (ref 6–23)
CO2: 28 mEq/L (ref 19–32)
Calcium: 9.6 mg/dL (ref 8.4–10.5)
Chloride: 101 mEq/L (ref 96–112)
Creatinine, Ser: 1.31 mg/dL (ref 0.40–1.50)
Potassium: 4.5 mEq/L (ref 3.5–5.3)

## 2009-11-14 ENCOUNTER — Encounter: Payer: Self-pay | Admitting: Internal Medicine

## 2009-11-20 ENCOUNTER — Encounter: Payer: Self-pay | Admitting: Internal Medicine

## 2009-11-20 ENCOUNTER — Ambulatory Visit: Payer: Self-pay | Admitting: Thoracic Surgery

## 2009-11-26 ENCOUNTER — Ambulatory Visit: Payer: Self-pay | Admitting: Cardiology

## 2009-11-26 LAB — CONVERTED CEMR LAB: POC INR: 2

## 2009-12-07 ENCOUNTER — Ambulatory Visit: Payer: Self-pay | Admitting: Oncology

## 2009-12-12 LAB — CBC WITH DIFFERENTIAL/PLATELET
HCT: 37.5 % — ABNORMAL LOW (ref 38.4–49.9)
MCH: 27 pg — ABNORMAL LOW (ref 27.2–33.4)
MCHC: 31.7 g/dL — ABNORMAL LOW (ref 32.0–36.0)
NEUT%: 34.7 % — ABNORMAL LOW (ref 39.0–75.0)
Platelets: 233 10*3/uL (ref 140–400)
nRBC: 0 % (ref 0–0)

## 2009-12-24 ENCOUNTER — Ambulatory Visit: Payer: Self-pay | Admitting: Cardiology

## 2009-12-24 LAB — CONVERTED CEMR LAB: POC INR: 1.7

## 2009-12-26 IMAGING — CR DG CHEST 2V
2 series · 2 of 2 positions shown · non-contrast
Comparison: 03/29/2008

CLINICAL DATA: Preop right lung base mass

CHEST - 2 VIEW

[w chest pa]
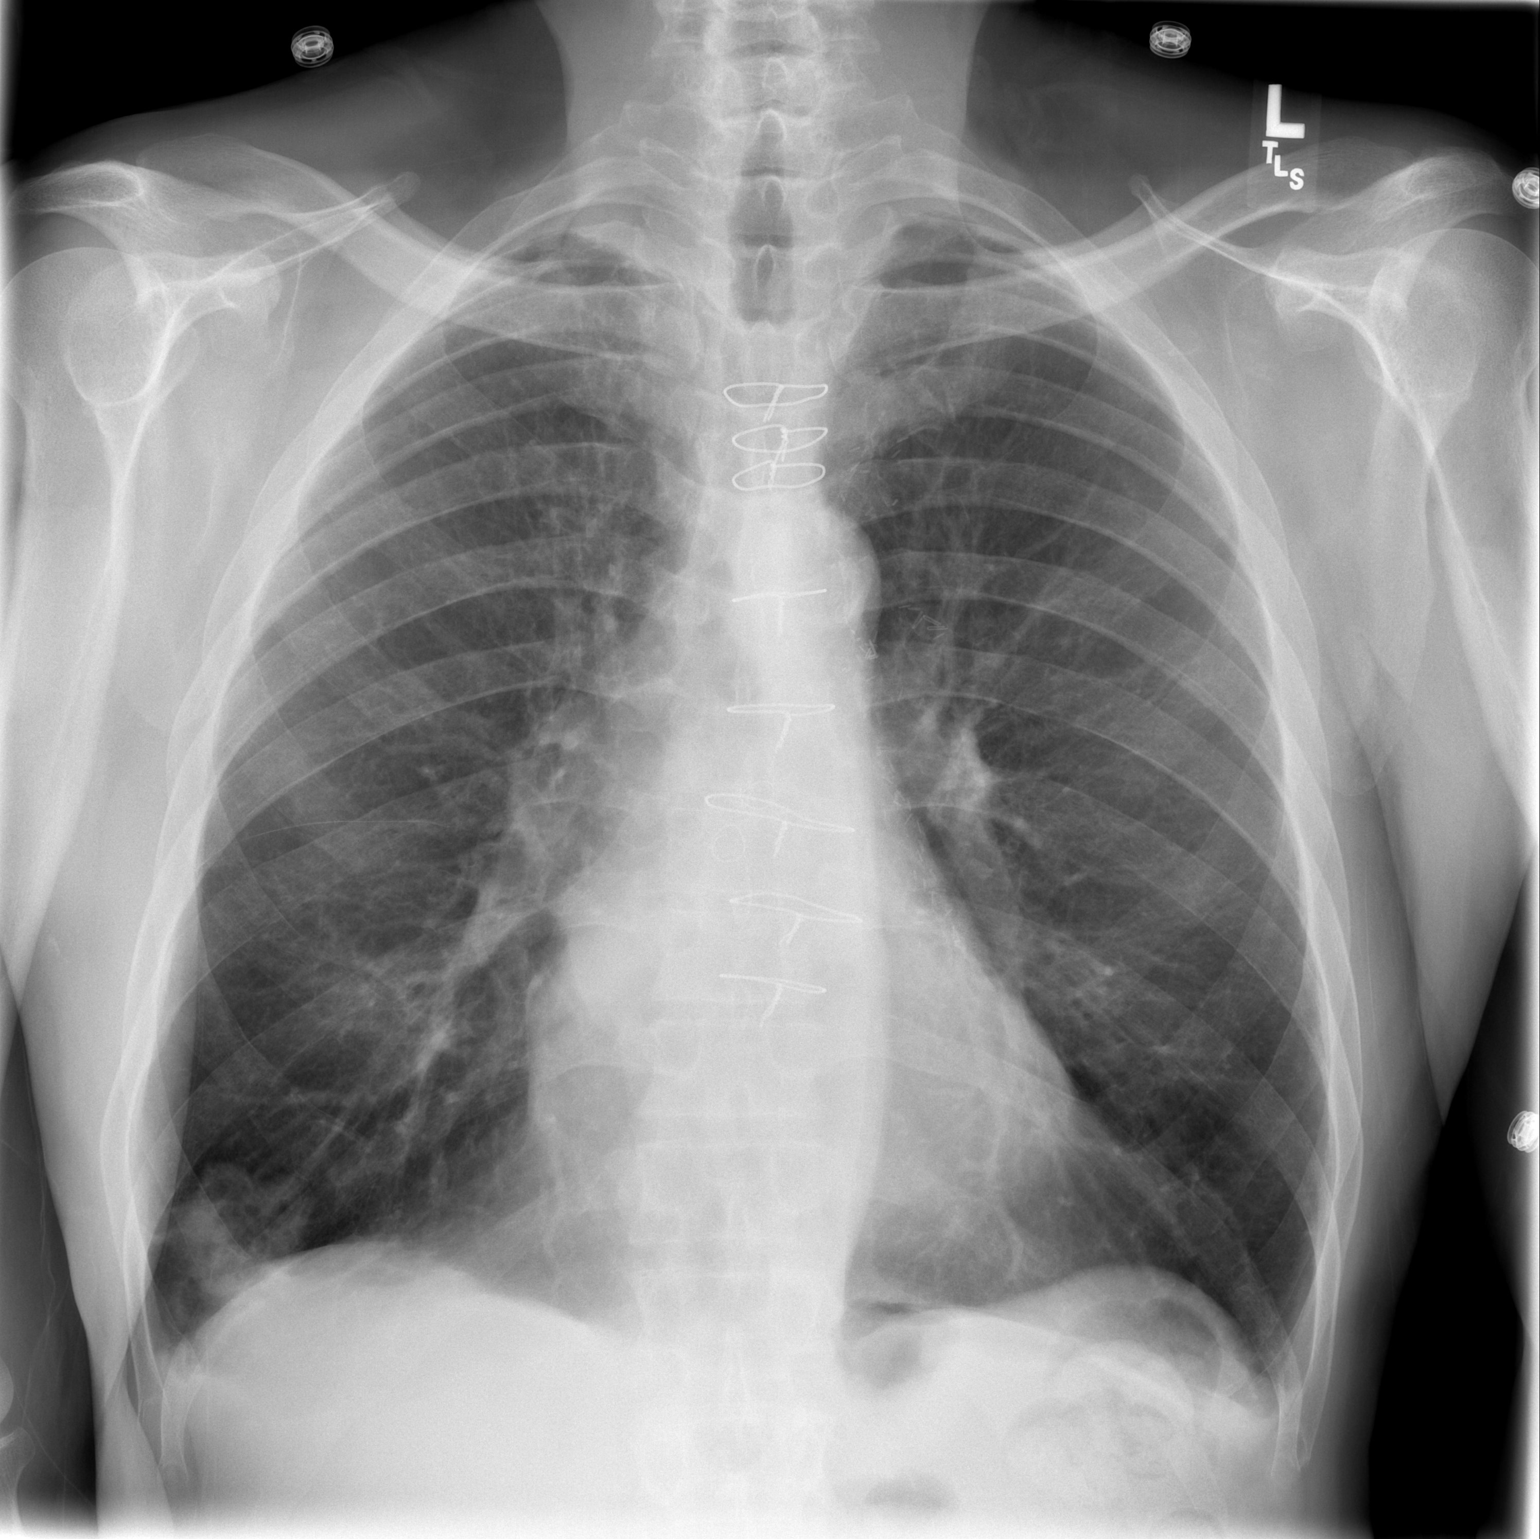

[w chest lat]
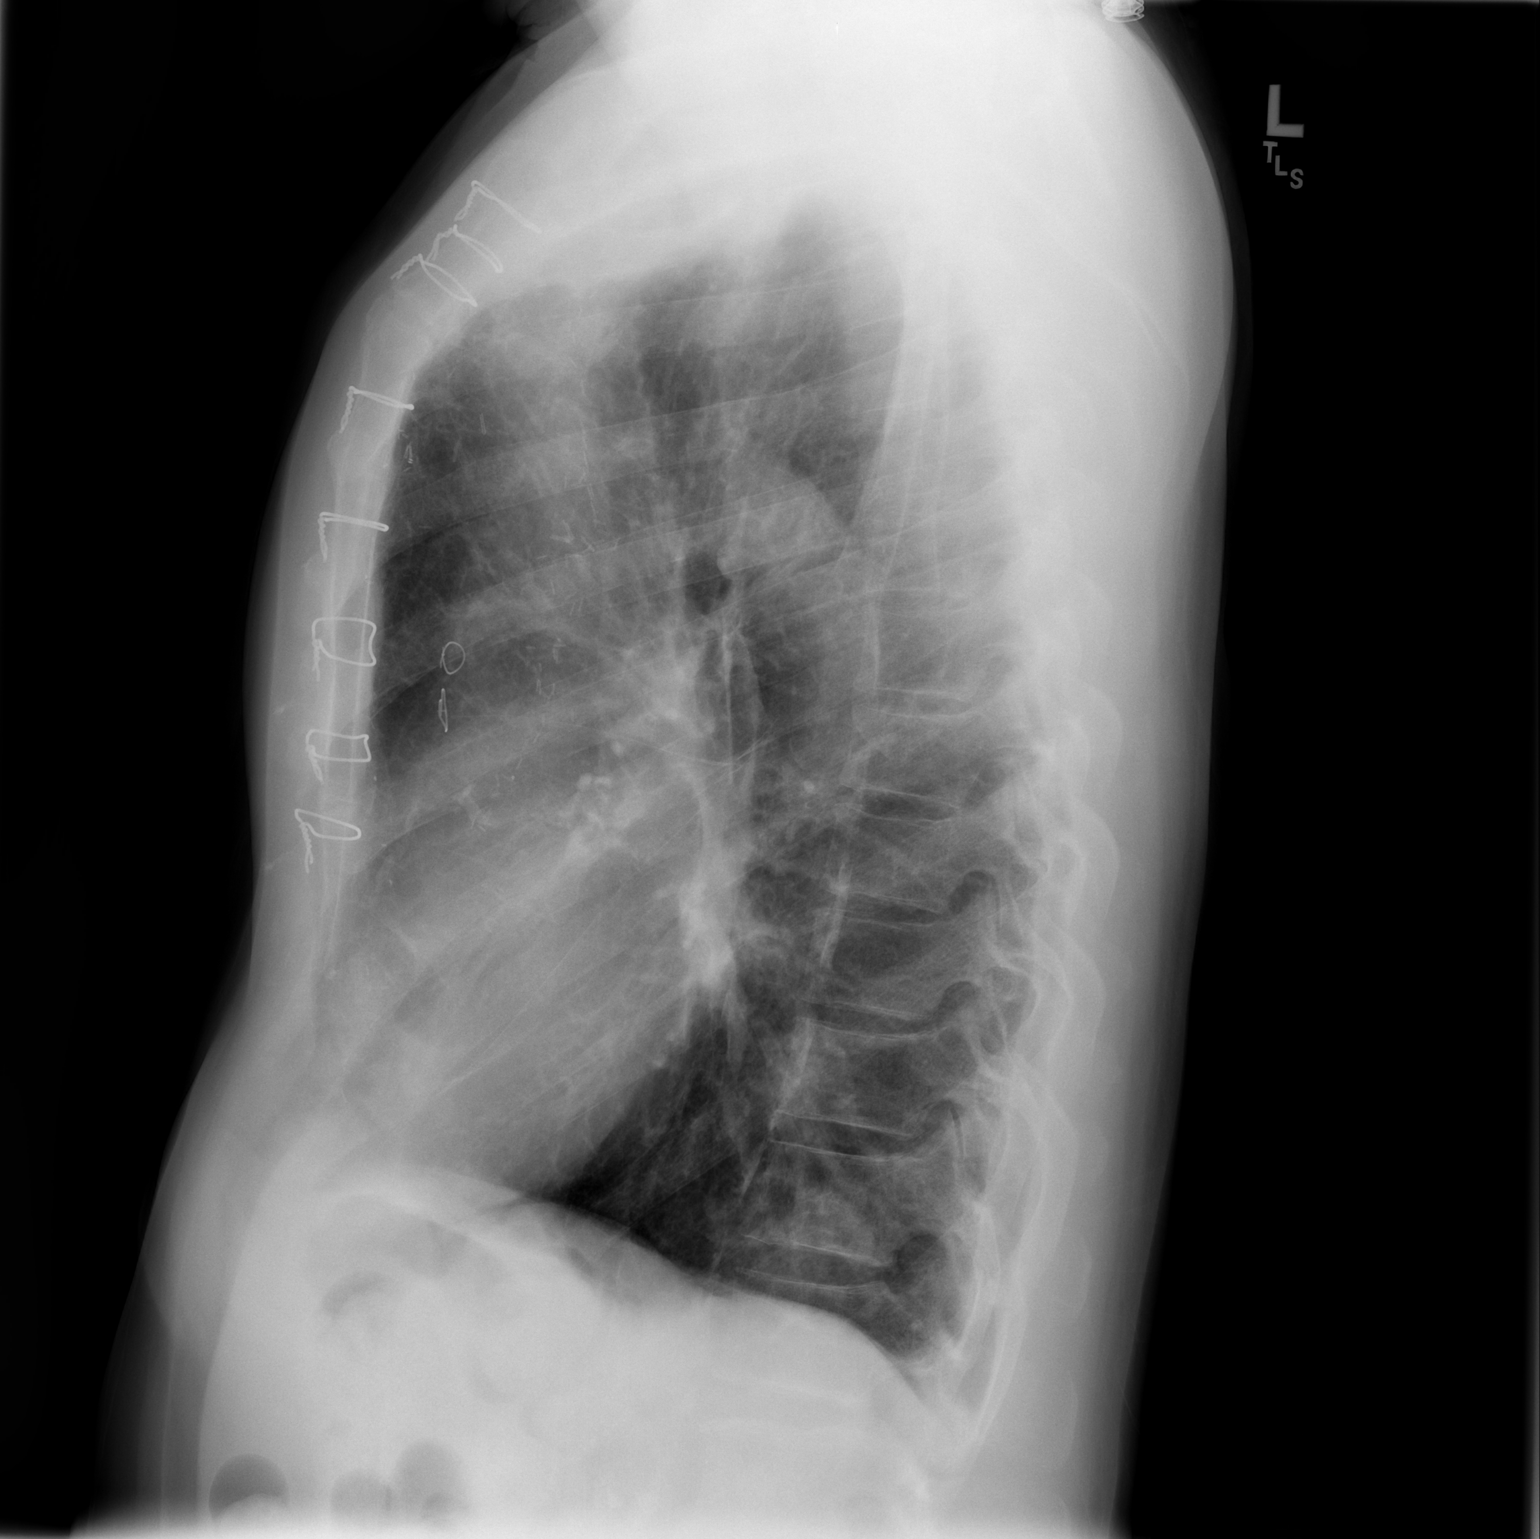

[2 of 2 positions shown; findings below may reference images not displayed]

FINDINGS: There is a mass at the right lung base which measures
cm.

Heart size is normal.

No pleural effusion or pulmonary edema

No acute airspace densities noted.
IMPRESSION: 1.  Stable right lung base mass.
2.  No acute findings.

## 2009-12-28 IMAGING — CR DG CHEST 1V PORT
1 series · 1 of 1 positions shown · non-contrast
Comparison: Portable chest x-ray yesterday and two-view chest x-ray
04/26/2008.

CLINICAL DATA: Postop right lower lobectomy.

PORTABLE CHEST - 1 VIEW [DATE]/[id number] hours:

[view not recorded]
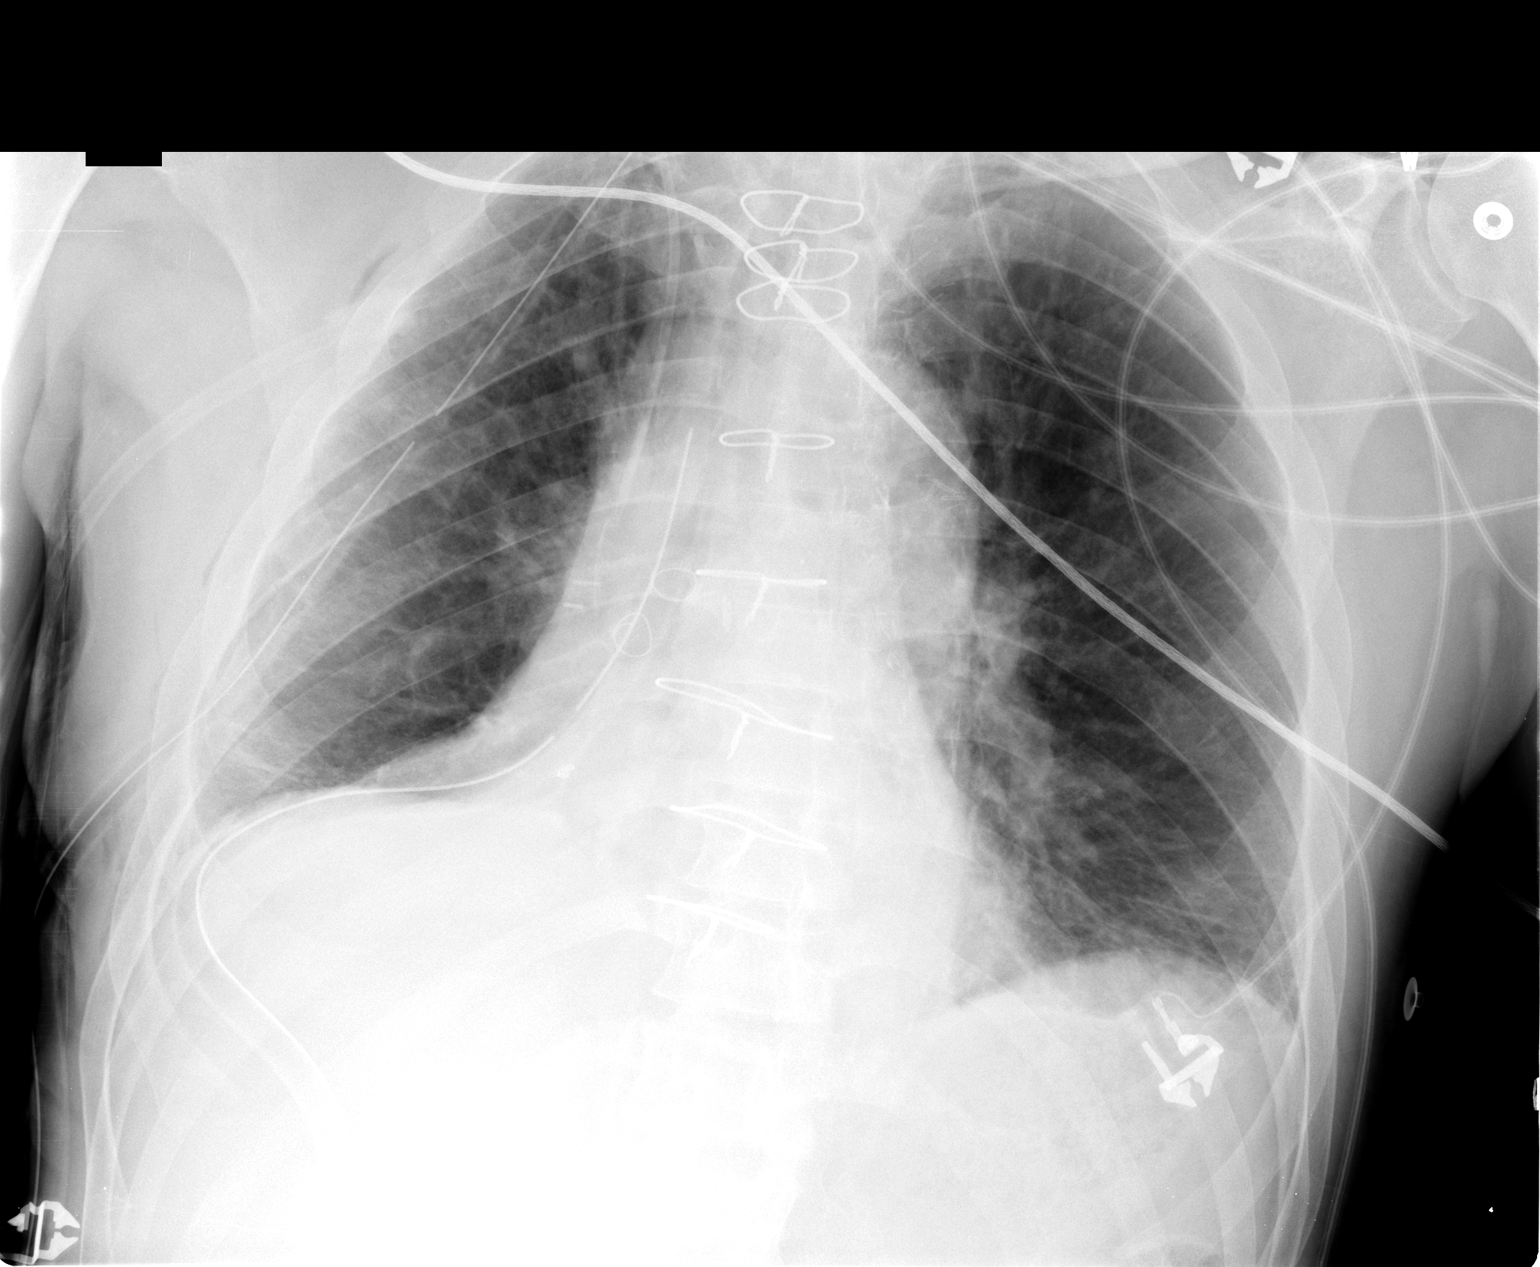

[1 of 1 positions shown; findings below may reference images not displayed]

FINDINGS: Two right chest tubes remain in place with no
pneumothorax.  Mild atelectasis at the base of the remaining right
lung.  Improved pulmonary venous hypertension.  Mild atelectasis at
the left base.  Right jugular central venous catheter tip in the
SVC.  Prior sternotomy for CABG.  Heart size upper normal to
perhaps slightly enlarged but stable.
IMPRESSION: No pneumothorax.  Mild atelectasis at the base of the remaining
right lung and at the left lung base.  No new abnormalities.

## 2010-01-07 ENCOUNTER — Ambulatory Visit: Payer: Self-pay | Admitting: Cardiovascular Disease

## 2010-01-07 ENCOUNTER — Ambulatory Visit: Payer: Self-pay | Admitting: Oncology

## 2010-01-09 ENCOUNTER — Encounter: Payer: Self-pay | Admitting: Internal Medicine

## 2010-01-09 LAB — CBC WITH DIFFERENTIAL/PLATELET
Basophils Absolute: 0.1 10*3/uL (ref 0.0–0.1)
Eosinophils Absolute: 0.3 10*3/uL (ref 0.0–0.5)
HCT: 30.8 % — ABNORMAL LOW (ref 38.4–49.9)
HGB: 10.1 g/dL — ABNORMAL LOW (ref 13.0–17.1)
MCH: 28.3 pg (ref 27.2–33.4)
MCV: 86.7 fL (ref 79.3–98.0)
MONO%: 14.5 % — ABNORMAL HIGH (ref 0.0–14.0)
NEUT#: 1.9 10*3/uL (ref 1.5–6.5)
NEUT%: 41.4 % (ref 39.0–75.0)
Platelets: 239 10*3/uL (ref 140–400)
RDW: 15.2 % — ABNORMAL HIGH (ref 11.0–14.6)

## 2010-01-14 ENCOUNTER — Ambulatory Visit: Payer: Self-pay | Admitting: Internal Medicine

## 2010-01-14 LAB — CONVERTED CEMR LAB
Ferritin: 49.7 ng/mL (ref 22.0–322.0)
Vitamin B-12: 570 pg/mL (ref 211–911)

## 2010-01-15 LAB — CONVERTED CEMR LAB
Albumin: 3.7 g/dL (ref 3.5–5.2)
BUN: 17 mg/dL (ref 6–23)
Basophils Relative: 1.3 % (ref 0.0–3.0)
Bilirubin, Direct: 0.2 mg/dL (ref 0.0–0.3)
CO2: 29 meq/L (ref 19–32)
Calcium: 9.3 mg/dL (ref 8.4–10.5)
Cholesterol: 146 mg/dL (ref 0–200)
Creatinine, Ser: 1.3 mg/dL (ref 0.4–1.5)
Eosinophils Absolute: 0.2 10*3/uL (ref 0.0–0.7)
Eosinophils Relative: 3.6 % (ref 0.0–5.0)
HDL: 56.9 mg/dL (ref >39.00)
Lymphocytes Relative: 25.3 % (ref 12.0–46.0)
MCHC: 32.3 g/dL (ref 30.0–36.0)
Neutrophils Relative %: 54.9 % (ref 43.0–77.0)
RBC: 3.54 M/uL — ABNORMAL LOW (ref 4.22–5.81)
TSH: 1.25 microintl units/mL (ref 0.35–5.50)
Total CHOL/HDL Ratio: 3
Total Protein: 7.2 g/dL (ref 6.0–8.3)
Triglycerides: 51 mg/dL (ref 0.0–149.0)
WBC: 5.9 10*3/uL (ref 4.5–10.5)

## 2010-02-04 ENCOUNTER — Encounter: Payer: Self-pay | Admitting: Cardiovascular Disease

## 2010-02-06 ENCOUNTER — Ambulatory Visit: Payer: Self-pay

## 2010-02-06 ENCOUNTER — Ambulatory Visit: Payer: Self-pay | Admitting: Cardiology

## 2010-02-06 ENCOUNTER — Encounter: Payer: Self-pay | Admitting: Cardiovascular Disease

## 2010-02-06 ENCOUNTER — Ambulatory Visit: Payer: Self-pay | Admitting: Oncology

## 2010-02-06 LAB — CBC WITH DIFFERENTIAL/PLATELET
Basophils Absolute: 0.2 10*3/uL — ABNORMAL HIGH (ref 0.0–0.1)
EOS%: 8.2 % — ABNORMAL HIGH (ref 0.0–7.0)
Eosinophils Absolute: 0.4 10*3/uL (ref 0.0–0.5)
LYMPH%: 41.8 % (ref 14.0–49.0)
MCH: 27.8 pg (ref 27.2–33.4)
MCV: 89.6 fL (ref 79.3–98.0)
MONO%: 13.9 % (ref 0.0–14.0)
NEUT#: 1.4 10*3/uL — ABNORMAL LOW (ref 1.5–6.5)
Platelets: 228 10*3/uL (ref 140–400)
RBC: 4.14 10*6/uL — ABNORMAL LOW (ref 4.20–5.82)
nRBC: 0 % (ref 0–0)

## 2010-03-06 LAB — CBC WITH DIFFERENTIAL/PLATELET
BASO%: 3.7 % — ABNORMAL HIGH (ref 0.0–2.0)
Basophils Absolute: 0.2 10*3/uL — ABNORMAL HIGH (ref 0.0–0.1)
Eosinophils Absolute: 0.3 10*3/uL (ref 0.0–0.5)
HCT: 40.4 % (ref 38.4–49.9)
HGB: 13.1 g/dL (ref 13.0–17.1)
LYMPH%: 39 % (ref 14.0–49.0)
MONO#: 0.6 10*3/uL (ref 0.1–0.9)
NEUT#: 1.6 10*3/uL (ref 1.5–6.5)
NEUT%: 36.9 % — ABNORMAL LOW (ref 39.0–75.0)
Platelets: 190 10*3/uL (ref 140–400)
WBC: 4.3 10*3/uL (ref 4.0–10.3)
lymph#: 1.7 10*3/uL (ref 0.9–3.3)

## 2010-03-07 ENCOUNTER — Ambulatory Visit: Payer: Self-pay | Admitting: Cardiology

## 2010-03-07 ENCOUNTER — Ambulatory Visit: Payer: Self-pay | Admitting: Vascular Surgery

## 2010-04-01 ENCOUNTER — Ambulatory Visit: Payer: Self-pay | Admitting: Oncology

## 2010-04-03 ENCOUNTER — Encounter: Payer: Self-pay | Admitting: Internal Medicine

## 2010-04-03 LAB — COMPREHENSIVE METABOLIC PANEL
AST: 18 U/L (ref 0–37)
Alkaline Phosphatase: 73 U/L (ref 39–117)
BUN: 21 mg/dL (ref 6–23)
Creatinine, Ser: 1.23 mg/dL (ref 0.40–1.50)
Glucose, Bld: 102 mg/dL — ABNORMAL HIGH (ref 70–99)
Total Bilirubin: 1.1 mg/dL (ref 0.3–1.2)

## 2010-04-03 LAB — CBC WITH DIFFERENTIAL/PLATELET
BASO%: 2.9 % — ABNORMAL HIGH (ref 0.0–2.0)
Basophils Absolute: 0.1 10*3/uL (ref 0.0–0.1)
EOS%: 4.7 % (ref 0.0–7.0)
HCT: 34.2 % — ABNORMAL LOW (ref 38.4–49.9)
LYMPH%: 37.7 % (ref 14.0–49.0)
MCH: 28 pg (ref 27.2–33.4)
MCHC: 31.9 g/dL — ABNORMAL LOW (ref 32.0–36.0)
MCV: 87.9 fL (ref 79.3–98.0)
MONO%: 15.2 % — ABNORMAL HIGH (ref 0.0–14.0)
NEUT%: 39.5 % (ref 39.0–75.0)
Platelets: 232 10*3/uL (ref 140–400)
lymph#: 1.5 10*3/uL (ref 0.9–3.3)

## 2010-04-04 ENCOUNTER — Ambulatory Visit: Payer: Self-pay | Admitting: Internal Medicine

## 2010-04-12 ENCOUNTER — Ambulatory Visit: Payer: Self-pay | Admitting: Cardiovascular Disease

## 2010-04-12 ENCOUNTER — Encounter: Payer: Self-pay | Admitting: Cardiovascular Disease

## 2010-04-23 ENCOUNTER — Encounter: Payer: Self-pay | Admitting: Internal Medicine

## 2010-04-29 ENCOUNTER — Ambulatory Visit: Payer: Self-pay | Admitting: Oncology

## 2010-05-01 ENCOUNTER — Ambulatory Visit: Payer: Self-pay | Admitting: Cardiology

## 2010-05-01 LAB — CBC WITH DIFFERENTIAL/PLATELET
BASO%: 4.3 % — ABNORMAL HIGH (ref 0.0–2.0)
HCT: 35.8 % — ABNORMAL LOW (ref 38.4–49.9)
HGB: 11.6 g/dL — ABNORMAL LOW (ref 13.0–17.1)
MCHC: 32.5 g/dL (ref 32.0–36.0)
MONO#: 0.5 10*3/uL (ref 0.1–0.9)
NEUT%: 34.4 % — ABNORMAL LOW (ref 39.0–75.0)
WBC: 3.5 10*3/uL — ABNORMAL LOW (ref 4.0–10.3)
lymph#: 1.5 10*3/uL (ref 0.9–3.3)

## 2010-05-29 ENCOUNTER — Encounter: Payer: Self-pay | Admitting: Internal Medicine

## 2010-05-29 ENCOUNTER — Ambulatory Visit: Payer: Self-pay | Admitting: Oncology

## 2010-05-29 ENCOUNTER — Ambulatory Visit: Payer: Self-pay | Admitting: Cardiology

## 2010-05-29 LAB — CBC WITH DIFFERENTIAL/PLATELET
Eosinophils Absolute: 0.2 10*3/uL (ref 0.0–0.5)
MONO#: 0.6 10*3/uL (ref 0.1–0.9)
MONO%: 14.5 % — ABNORMAL HIGH (ref 0.0–14.0)
NEUT#: 1.4 10*3/uL — ABNORMAL LOW (ref 1.5–6.5)
RBC: 3.68 10*6/uL — ABNORMAL LOW (ref 4.20–5.82)
RDW: 14.5 % (ref 11.0–14.6)
WBC: 4.1 10*3/uL (ref 4.0–10.3)
nRBC: 0 % (ref 0–0)

## 2010-06-21 LAB — CMP (CANCER CENTER ONLY)
ALT(SGPT): 19 U/L (ref 10–47)
AST: 27 U/L (ref 11–38)
Albumin: 3.7 g/dL (ref 3.3–5.5)
Alkaline Phosphatase: 70 U/L (ref 26–84)
BUN, Bld: 18 mg/dL (ref 7–22)
CO2: 28 mEq/L (ref 18–33)
Calcium: 8.9 mg/dL (ref 8.0–10.3)
Chloride: 102 mEq/L (ref 98–108)
Creat: 1 mg/dl (ref 0.6–1.2)
Glucose, Bld: 100 mg/dL (ref 73–118)
Potassium: 4.3 mEq/L (ref 3.3–4.7)
Sodium: 143 mEq/L (ref 128–145)
Total Bilirubin: 1.1 mg/dl (ref 0.20–1.60)
Total Protein: 8 g/dL (ref 6.4–8.1)

## 2010-06-24 ENCOUNTER — Ambulatory Visit (HOSPITAL_COMMUNITY)
Admission: RE | Admit: 2010-06-24 | Discharge: 2010-06-24 | Payer: Self-pay | Source: Home / Self Care | Attending: Oncology | Admitting: Oncology

## 2010-06-26 ENCOUNTER — Ambulatory Visit: Admission: RE | Admit: 2010-06-26 | Discharge: 2010-06-26 | Payer: Self-pay | Source: Home / Self Care

## 2010-06-26 LAB — CBC WITH DIFFERENTIAL/PLATELET
BASO%: 3.6 % — ABNORMAL HIGH (ref 0.0–2.0)
Basophils Absolute: 0.2 10*3/uL — ABNORMAL HIGH (ref 0.0–0.1)
EOS%: 3.9 % (ref 0.0–7.0)
Eosinophils Absolute: 0.2 10*3/uL (ref 0.0–0.5)
HCT: 38 % — ABNORMAL LOW (ref 38.4–49.9)
HGB: 12.2 g/dL — ABNORMAL LOW (ref 13.0–17.1)
LYMPH%: 36.3 % (ref 14.0–49.0)
MCH: 29.3 pg (ref 27.2–33.4)
MCHC: 32.1 g/dL (ref 32.0–36.0)
MCV: 91.1 fL (ref 79.3–98.0)
MONO#: 0.8 10*3/uL (ref 0.1–0.9)
MONO%: 14.6 % — ABNORMAL HIGH (ref 0.0–14.0)
NEUT#: 2.2 10*3/uL (ref 1.5–6.5)
NEUT%: 41.6 % (ref 39.0–75.0)
Platelets: 259 10*3/uL (ref 140–400)
RBC: 4.17 10*6/uL — ABNORMAL LOW (ref 4.20–5.82)
RDW: 15.2 % — ABNORMAL HIGH (ref 11.0–14.6)
WBC: 5.4 10*3/uL (ref 4.0–10.3)
lymph#: 1.9 10*3/uL (ref 0.9–3.3)
nRBC: 0 % (ref 0–0)

## 2010-06-26 LAB — CONVERTED CEMR LAB: POC INR: 2.3

## 2010-07-06 ENCOUNTER — Encounter: Payer: Self-pay | Admitting: Internal Medicine

## 2010-07-07 LAB — CONVERTED CEMR LAB
BUN: 19 mg/dL (ref 6–23)
Chloride: 106 meq/L (ref 96–112)
Creatinine, Ser: 1.6 mg/dL — ABNORMAL HIGH (ref 0.4–1.5)
Glucose, Bld: 85 mg/dL (ref 70–99)
Potassium: 4.7 meq/L (ref 3.5–5.1)

## 2010-07-09 NOTE — Medication Information (Signed)
Summary: rov/tm  Anticoagulant Therapy  Managed by: Cloyde Reams, RN, BSN Referring MD: Tonny Bollman PCP: Birdie Sons, MD Supervising MD: Shirlee Latch MD, Consuella Scurlock Indication 1: Atrial Fibrillation (ICD-427.31) Indication 2: TIA (ICD-435.9) Lab Used: [facility] Site: [facility] INR POC 1.9 INR RANGE 2 - 3  Dietary changes: no    Health status changes: no    Bleeding/hemorrhagic complications: no    Recent/future hospitalizations: no    Any changes in medication regimen? no    Recent/future dental: no  Any missed doses?: no       Is patient compliant with meds? yes       Allergies (verified): No Known Drug Allergies  Anticoagulation Management History:      The patient is taking warfarin and comes in today for a routine follow up visit.  Positive risk factors for bleeding include an age of 75 years or older, history of CVA/TIA, and presence of serious comorbidities.  The bleeding index is 'high risk'.  Positive CHADS2 values include History of CHF, Age > 36 years old, and Prior Stroke/CVA/TIA.  Negative CHADS2 values include History of Diabetes.  The start date was 02/16/2002.  His last INR was 3.7.  Anticoagulation responsible provider: Shirlee Latch MD, Daleyssa Loiselle.  INR POC: 1.9.  Cuvette Lot#: [id number].  Exp: 10/2010.    Anticoagulation Management Assessment/Plan:      The patient's current anticoagulation dose is Warfarin sodium 5 mg tabs: Use as directed by Anticoagulation Clinic.  The target INR is 2.0-3.0.  The next INR is due 09/03/2009.  Anticoagulation instructions were given to patient.  Results were reviewed/authorized by Cloyde Reams, RN, BSN.  He was notified by Cloyde Reams RN.         Prior Anticoagulation Instructions: INR 2.0 Today take 1 pill then Continue 1/2 pill everyday excpet 1 pill on Tuesdays, Thursdays and Sundays. Recheck in 3 weeks.    Current Anticoagulation Instructions: INR 1.9  Take 1 tablet today, then start taking 1 tablet daily except 1/2 tablet  on Monday, Wednesday and Friday.  Recheck in 4 weeks.

## 2010-07-09 NOTE — Letter (Signed)
Summary: Patient Notice-Endo Biopsy Results  [facility]  [address] [place], [place] [address]   Phone: [phone]  Fax: [phone]        September 29, 2009 MRN: [id number]    [facility] [address] [place], [place]  [address]    Dear Mr. TUGWELL,  I am pleased to inform you that the biopsies taken during your recent endoscopic examination did not show any evidence of cancer upon pathologic examination. There was inflammation seen. this is common and I do not think it is causing problems so no change in therapy or other testing is required.  No further action is needed at this time.  Please follow-up with      your primary care physician (Dr. Cato Mulligan) and Dr. Clelia Croft for your other healthcare needs.  Please call us if you are having persistent problems or have questions about your condition that have not been fully answered at this time.  Sincerely,  Iva Boop MD, [facility]  This letter has been electronically signed by your physician.  Appended Document: Patient Notice-Endo Biopsy Results letter mailed 4.25.11

## 2010-07-09 NOTE — Letter (Signed)
Summary: [facility]  [facility]   Imported By: Maryln Gottron 06/21/2009 15:04:30  _____________________________________________________________________  External Attachment:    Type:   Image     Comment:   External Document

## 2010-07-09 NOTE — Letter (Signed)
[facility]   Imported By: Maryln Gottron 12/13/2009 12:36:42  _____________________________________________________________________  External Attachment:    Type:   Image     Comment:   External Document

## 2010-07-09 NOTE — Letter (Signed)
Summary: [facility]  [facility]   Imported By: Sherian Rein 05/08/2010 10:31:37  _____________________________________________________________________  External Attachment:    Type:   Image     Comment:   External Document

## 2010-07-09 NOTE — Assessment & Plan Note (Signed)
Summary: 6 month rov/njr   Vital Signs:  Patient profile:   77 year old male Weight:      155 pounds BMI:     22.32 Temp:     98.2 degrees F oral Pulse rate:   72 / minute Pulse rhythm:   regular Resp:     12 per minute BP sitting:   114 / 60  (left arm) Cuff size:   regular  Vitals Entered By: Gladis Riffle, RN (January 14, 2010 9:21 AM) CC: 6 month rov Is Patient Diabetic? No   Primary Care Provider:  Birdie Sons, MD  CC:  6 month rov.  History of Present Illness:  Follow-Up Visit      This is a 77 year old man who presents for Follow-up visit.  The patient denies chest pain and palpitations.  Since the last visit the patient notes no new problems or concerns.  The patient reports taking meds as prescribed.  When questioned about possible medication side effects, the patient notes none.  sees oncology monthly getting monthly injections for "my blood count"  All other systems reviewed and were negative   Preventive Screening-Counseling & Management  Alcohol-Tobacco     Smoking Status: quit     Year Quit: 2004  Current Problems (verified): 1)  Prostate Specific Antigen, Elevated  (ICD-790.93) 2)  Carotid Artery Stenosis, Without Infarction  (ICD-433.10) 3)  Abnormal Prostate On Pet Scan  (ICD-602.9) 4)  Carcinoma, Lung, Squamous Cell  (ICD-162.9) 5)  Coumadin Therapy  (ICD-V58.61) 6)  Kidney Disease, Chronic, Stage II  (ICD-585.2) 7)  Anemia  (ICD-285.9) 8)  Congestive Heart Failure  (ICD-428.0) 9)  Coronary Artery Disease  (ICD-414.00) 10)  Transient Ischemic Attack, Hx of  (ICD-V12.50) 11)  Rheumatoid Arthritis  (ICD-714.0) 12)  Osteopenia  (ICD-733.90) 13)  Myocardial Infarction, Hx of  (ICD-412) 14)  Hyperlipidemia  (ICD-272.4) 15)  Colonic Polyps, Hx of  (ICD-V12.72) 16)  Atrial Fibrillation  (ICD-427.31)  Current Medications (verified): 1)  Cartia Xt 120 Mg Cp24 (Diltiazem Hcl Coated Beads) .... Once Daily 2)  Lisinopril 20 Mg  Tabs (Lisinopril) .... One  By Mouth Daily 3)  Flomax 0.4 Mg  Cp24 (Tamsulosin Hcl) .... One Q Day 4)  Alendronate Sodium 70 Mg  Tabs (Alendronate Sodium) .Marland Kitchen.. 1 By Mouth Q Week . Take With 8 Ounces of Water On and Empty Stomach 5)  Folic Acid 400 Mcg  Tabs (Folic Acid) .... One By Mouth Daily 6)  Calcium 600/vitamin D 600-200 Mg-Unit  Tabs (Calcium Carbonate-Vitamin D) .... Once Daily 7)  Simvastatin 40 Mg Tabs (Simvastatin) .... Take One Tablet At Bedtime 8)  Finasteride 5 Mg Tabs (Finasteride) .... Take 1 Tablet By Mouth Once A Day 9)  Aspirin 81 Mg Tbec (Aspirin) .... Take 1 Tablet By Mouth Once A Day 10)  Ferrous Sulfate 325 (65 Fe) Mg Tabs (Ferrous Sulfate) .... Take 1 Tablet By Mouth Two Times A Day 11)  Warfarin Sodium 5 Mg Tabs (Warfarin Sodium) .... Use As Directed By Anticoagulation Clinic 12)  Toprol Xl 100 Mg Xr24h-Tab (Metoprolol Succinate) .... Take 1 Tablet By Mouth Once A Day 13)  Tylenol 325 Mg Tabs (Acetaminophen) .... As Needed  Allergies (verified): No Known Drug Allergies  Past History:  Past Medical History: Last updated: 04/10/2009 Atrial fibrillation, paroxysmal Colonic polyps, hx of 2005 6 mm adenoma Hyperlipidemia Myocardial infarction, hx of Osteopenia-long time steroids Rheumatoid arthritis Transient ischemic attack, hx of-likely secondary Coronary artery disease status post CABG chronic  A.C. (coumadin clinic) Anemia-NOS Peripheral arterial disease  Family History: Last updated: 09/01/2008 No FH of Colon Cancer: none contributory  Social History: Last updated: 02/16/2009 Regular exercise-no Occupation: Former [employer] architect Patient is a former smoker. -stopped 2003 Alcohol Use - no Daily Caffeine Use-1/2 cup daily Illicit Drug Use - no Patient gets regular exercise.  Risk Factors: Exercise: yes (08/21/2008)  Risk Factors: Smoking Status: quit (01/14/2010)  Past Surgical History: angioplasty 1995 CABG 01/31/02 R femoral artery occlusion-- embolectomy Right  femoral to bellow-knee popliteal artery bypass graft on 01/13/ 2010 Electrophysiology study and radiofrequency catheter  ablation of atrial flutter.  2009: Right VATS minithoracotomy and right lower   lobectomy.   Physical Exam  General:  alert and well-developed.   Head:  normocephalic and atraumatic.   Eyes:  pupils equal and pupils round.   Ears:  R ear normal and L ear normal.   Neck:  No deformities, masses, or tenderness noted. Lungs:  normal respiratory effort and no intercostal retractions.   Heart:  normal rate and regular rhythm.   Abdomen:  soft and non-tender.   Msk:  normal ROM and no joint tenderness.   Neurologic:  cranial nerves II-XII intact and gait normal.     Impression & Recommendations:  Problem # 1:  ATRIAL FIBRILLATION (ICD-427.31)  says he gets protime checked at least monthly at CV office no bleeding complications His updated medication list for this problem includes:    Cartia Xt 120 Mg Cp24 (Diltiazem hcl coated beads) ..... Once daily    Aspirin 81 Mg Tbec (Aspirin) .Marland Kitchen... Take 1 tablet by mouth once a day    Warfarin Sodium 5 Mg Tabs (Warfarin sodium) ..... Use as directed by anticoagulation clinic    Toprol Xl 100 Mg Xr24h-tab (Metoprolol succinate) .Marland Kitchen... Take 1 tablet by mouth once a day  Reviewed the following: PT: 23.2 (01/16/2009)   INR: 3.7 (01/16/2009) Coumadin Dose (weekly): 30 mg (01/07/2010) Prior Coumadin Dose (weekly): 30 mg (01/07/2010) Next Protime: 02/06/2010 (dated on 01/07/2010)  Orders: TLB-TSH (Thyroid Stimulating Hormone) (84443-TSH)  Problem # 2:  CARCINOMA, LUNG, SQUAMOUS CELL (ICD-162.9) followed by oncology clinic  Problem # 3:  HYPERLIPIDEMIA (ICD-272.4)  previously controlled continue current medications  His updated medication list for this problem includes:    Simvastatin 40 Mg Tabs (Simvastatin) .Marland Kitchen... Take one tablet at bedtime  Labs Reviewed: SGOT: 15 (07/16/2009)   SGPT: 8 (07/16/2009)  Lipid  Goals: Chol Goal: 200 (02/01/2007)   HDL Goal: 40 (02/01/2007)   LDL Goal: 100 (02/01/2007)   TG Goal: 150 (02/01/2007)  Prior 10 Yr Risk Heart Disease: N/A (02/01/2007)   HDL:55.50 (07/16/2009), 42.80 (04/12/2009)  LDL:82 (07/16/2009), 97 (04/12/2009)  Chol:151 (07/16/2009), 158 (04/12/2009)  Trig:67.0 (07/16/2009), 92.0 (04/12/2009)  Orders: TLB-Hepatic/Liver Function Pnl (80076-HEPATIC)  Problem # 4:  KIDNEY DISEASE, CHRONIC, STAGE II (ICD-585.2) check labs today Labs Reviewed: BUN: 19 (07/16/2009)   Cr: 1.3 (07/16/2009)    Hgb: 10.9 (07/16/2009)   Hct: 35.1 (07/16/2009)   Ca++: 9.4 (07/16/2009)   Phos: 3.0 (10/13/2007) TP: 7.8 (07/16/2009)   Alb: 3.7 (07/16/2009)  Problem # 5:  CORONARY ARTERY DISEASE (ICD-414.00)  no sxs continue current medications  His updated medication list for this problem includes:    Cartia Xt 120 Mg Cp24 (Diltiazem hcl coated beads) ..... Once daily    Lisinopril 20 Mg Tabs (Lisinopril) ..... One by mouth daily    Aspirin 81 Mg Tbec (Aspirin) .Marland Kitchen... Take 1 tablet by mouth once a day  Toprol Xl 100 Mg Xr24h-tab (Metoprolol succinate) .Marland Kitchen... Take 1 tablet by mouth once a day  Labs Reviewed: Chol: 151 (07/16/2009)   HDL: 55.50 (07/16/2009)   LDL: 82 (07/16/2009)   TG: 67.0 (07/16/2009)  Lipid Goals: Chol Goal: 200 (02/01/2007)   HDL Goal: 40 (02/01/2007)   LDL Goal: 100 (02/01/2007)   TG Goal: 150 (02/01/2007)  Orders: TLB-Lipid Panel (80061-LIPID) TLB-BMP (Basic Metabolic Panel-BMET) (80048-METABOL)  Complete Medication List: 1)  Cartia Xt 120 Mg Cp24 (Diltiazem hcl coated beads) .... Once daily 2)  Lisinopril 20 Mg Tabs (Lisinopril) .... One by mouth daily 3)  Flomax 0.4 Mg Cp24 (Tamsulosin hcl) .... One q day 4)  Alendronate Sodium 70 Mg Tabs (Alendronate sodium) .Marland Kitchen.. 1 by mouth q week . take with 8 ounces of water on and empty stomach 5)  Folic Acid 400 Mcg Tabs (Folic acid) .... One by mouth daily 6)  Calcium 600/vitamin D 600-200 Mg-unit Tabs  (Calcium carbonate-vitamin d) .... Once daily 7)  Simvastatin 40 Mg Tabs (Simvastatin) .... Take one tablet at bedtime 8)  Finasteride 5 Mg Tabs (Finasteride) .... Take 1 tablet by mouth once a day 9)  Aspirin 81 Mg Tbec (Aspirin) .... Take 1 tablet by mouth once a day 10)  Ferrous Sulfate 325 (65 Fe) Mg Tabs (Ferrous sulfate) .... Take 1 tablet by mouth two times a day 11)  Warfarin Sodium 5 Mg Tabs (Warfarin sodium) .... Use as directed by anticoagulation clinic 12)  Toprol Xl 100 Mg Xr24h-tab (Metoprolol succinate) .... Take 1 tablet by mouth once a day 13)  Tylenol 325 Mg Tabs (Acetaminophen) .... As needed  Other Orders: Venipuncture (83151) TLB-CBC Platelet - w/Differential (85025-CBCD)   Patient Instructions: 1)  Please schedule a follow-up appointment in 6 months.  Appended Document: Orders Update    Clinical Lists Changes  Orders: Added new Service order of Specimen Handling ([id number]) - Signed

## 2010-07-09 NOTE — Procedures (Signed)
Summary: Upper Endoscopy  Patient: [name] Note: All result statuses are Final unless otherwise noted.  Tests: (1) Upper Endoscopy (EGD)   EGD Upper Endoscopy       DONE     [facility]     [address]     [place], [place]  [address]           ENDOSCOPY PROCEDURE REPORT           PATIENT:  [name], [name]  MR#:  [id number]     BIRTHDATE:  June 30, 1931, 77 yrs. old  GENDER:  male           ENDOSCOPIST:  Iva Boop, MD, [facility]           PROCEDURE DATE:  09/26/2009     PROCEDURE:  EGD with biopsy     ASA CLASS:  Class III     INDICATIONS:  anemia           MEDICATIONS:   Fentanyl 50 mcg IV, Versed 6 mg IV     TOPICAL ANESTHETIC:  Exactacain Spray           DESCRIPTION OF PROCEDURE:   After the risks benefits and     alternatives of the procedure were thoroughly explained, informed     consent was obtained.  The LB [id number]-H180 [id number] endoscope was     introduced through the mouth and advanced to the second portion of     the duodenum, without limitations.  The instrument was slowly     withdrawn as the mucosa was fully examined.     <<PROCEDUREIMAGES>>           Abnormal appearing mucosa in the body of the stomach. Thickened     folds in the stomach with irregular folds and friable mucosa.     Multiple biopsies were obtained and sent to pathology.  Otherwise     the examination was normal.    Retroflexed views revealed     Retroflexion exam demonstrated findings as previously described.     The scope was then withdrawn from the patient and the procedure     completed.           COMPLICATIONS:  None           ENDOSCOPIC IMPRESSION:     1) Abnormal mucosa in the body of the stomach - focal gastritis     likely - bopisied     2) Otherwise normal examination           REPEAT EXAM:  In for as needed.           Iva Boop, MD, [facility]           CC:  Lindley Magnus, MD     Eli Hose, MD     The Patient           n.     Rosalie Doctor:   Iva Boop at  09/26/2009 03:42 PM           Ronnette Juniper, [phone]  Note: An exclamation mark (!) indicates a result that was not dispersed into the flowsheet. Document Creation Date: 09/26/2009 3:42 PM _______________________________________________________________________  (1) Order result status: Final Collection or observation date-time: 09/26/2009 15:16 Requested date-time:  Receipt date-time:  Reported date-time:  Referring Physician:   Ordering Physician: Stan Head [phone]) Specimen Source:  Source: Launa Grill Order Number: [id number] Lab site:   Appended Document: Upper Endoscopy   EGD  Procedure date:  09/26/2009  Findings:       1) Abnormal mucosa in the body of the stomach - focal gastritis     likely - bopisied GASTRITIS AND INTESTINAL METAPLASIA     2) Otherwise normal examination  Comments:      F/U PRN

## 2010-07-09 NOTE — Letter (Signed)
Summary: [facility]  [facility]   Imported By: Maryln Gottron 12/06/2009 11:02:07  _____________________________________________________________________  External Attachment:    Type:   Image     Comment:   External Document

## 2010-07-09 NOTE — Progress Notes (Signed)
Summary: needs rev  Phone Note Outgoing Call   Summary of Call: recall reviewed and I would like him to have an REV please call him and arrange Thanks Iva Boop MD, [facility]  August 05, 2009 10:39 AM   Follow-up for Phone Call        REV scheduled for 09-07-09 1:45 Follow-up by: Darcey Nora RN, CGRN,  August 06, 2009 10:41 AM

## 2010-07-09 NOTE — Letter (Signed)
Summary: [facility]  [facility]   Imported By: Maryln Gottron 06/21/2009 14:37:02  _____________________________________________________________________  External Attachment:    Type:   Image     Comment:   External Document

## 2010-07-09 NOTE — Medication Information (Signed)
Summary: rov/ewj  Anticoagulant Therapy  Managed by: Bethena Midget, RN, BSN Referring MD: Tonny Bollman PCP: Birdie Sons, MD Supervising MD: Excell Seltzer MD, Casimiro Needle Indication 1: Atrial Fibrillation (ICD-427.31) Indication 2: TIA (ICD-435.9) Lab Used: [facility] Site: [facility] INR POC 1.6 INR RANGE 2 - 3  Dietary changes: no    Health status changes: no    Bleeding/hemorrhagic complications: no    Recent/future hospitalizations: no    Any changes in medication regimen? no    Recent/future dental: no  Any missed doses?: no       Is patient compliant with meds? yes      Comments: Restarted last Wednesday after being off for 5 days.   Allergies: No Known Drug Allergies  Anticoagulation Management History:      The patient is taking warfarin and comes in today for a routine follow up visit.  Positive risk factors for bleeding include an age of 75 years or older, history of CVA/TIA, and presence of serious comorbidities.  The bleeding index is 'high risk'.  Positive CHADS2 values include History of CHF, Age > 52 years old, and Prior Stroke/CVA/TIA.  Negative CHADS2 values include History of Diabetes.  The start date was 02/16/2002.  His last INR was 3.7.  Anticoagulation responsible provider: Excell Seltzer MD, Casimiro Needle.  INR POC: 1.6.  Cuvette Lot#: [id number].  Exp: 11/2010.    Anticoagulation Management Assessment/Plan:      The patient's current anticoagulation dose is Warfarin sodium 5 mg tabs: Use as directed by Anticoagulation Clinic.  The target INR is 2.0-3.0.  The next INR is due 10/15/2009.  Anticoagulation instructions were given to patient.  Results were reviewed/authorized by Bethena Midget, RN, BSN.  He was notified by Bethena Midget, RN, BSN.         Prior Anticoagulation Instructions: INR 2.2  Continue on same dosage 1 tablet daily except 1/2 tablet on Mondays, Wednesdays, and Fridays.  Recheck in 4 weeks.    Current Anticoagulation Instructions: INR 1.6 Today take 5mg s  then resume 5mg s daily except 2.5mg s on Mondays, Wednesdays and Fridays. Recheck in 2 weeks.

## 2010-07-09 NOTE — Assessment & Plan Note (Signed)
Summary: f1y   Visit Type:  1 year follow up Primary Provider:  Birdie Sons, MD  CC:  Nose bleeds.  History of Present Illness: This is a 77 year old gentleman with CAD status post CABG, peripheral arterial disease, and history of atrial fib/flutter, presenting for followup. The patient is currently doing well. He denies chest pain, chest pressure, palpitations, edema, orthopnea, or PND.  He has mild chronic dyspnea with exertion.  He complains of episodic nose bleeds - able to control with manual pressure. These occur about once per week. No other bleeding problems reported.  Current Medications (verified): 1)  Cartia Xt 120 Mg Cp24 (Diltiazem Hcl Coated Beads) .... Once Daily 2)  Lisinopril 20 Mg  Tabs (Lisinopril) .... One By Mouth Daily 3)  Rapaflo 8 Mg Caps (Silodosin) .... Take 1 Tab By Mouth At Bedtime 4)  Alendronate Sodium 70 Mg  Tabs (Alendronate Sodium) .Marland Kitchen.. 1 By Mouth Q Week . Take With 8 Ounces of Water On and Empty Stomach 5)  Folic Acid 400 Mcg  Tabs (Folic Acid) .... One By Mouth Daily 6)  Calcium 600/vitamin D 600-200 Mg-Unit  Tabs (Calcium Carbonate-Vitamin D) .... Once Daily 7)  Simvastatin 40 Mg Tabs (Simvastatin) .... Take One Tablet At Bedtime 8)  Finasteride 5 Mg Tabs (Finasteride) .... Take 1 Tablet By Mouth Once A Day 9)  Aspirin 81 Mg Tbec (Aspirin) .... Take 1 Tablet By Mouth Once A Day 10)  Ferrous Sulfate 325 (65 Fe) Mg Tabs (Ferrous Sulfate) .... Take 1 Tablet By Mouth Two Times A Day 11)  Warfarin Sodium 5 Mg Tabs (Warfarin Sodium) .... Use As Directed By Anticoagulation Clinic 12)  Toprol Xl 100 Mg Xr24h-Tab (Metoprolol Succinate) .... Take 1 Tablet By Mouth Once A Day 13)  Tylenol 325 Mg Tabs (Acetaminophen) .... As Needed  Allergies (verified): No Known Drug Allergies  Past History:  Past medical history reviewed for relevance to current acute and chronic problems.  Past Medical History: Reviewed history from 04/10/2009 and no changes  required. Atrial fibrillation, paroxysmal Colonic polyps, hx of 2005 6 mm adenoma Hyperlipidemia Myocardial infarction, hx of Osteopenia-long time steroids Rheumatoid arthritis Transient ischemic attack, hx of-likely secondary Coronary artery disease status post CABG chronic A.C. (coumadin clinic) Anemia-NOS Peripheral arterial disease  Review of Systems       Chronic exertional dyspnea, otherwise negative except as per HPI   Vital Signs:  Patient profile:   77 year old male Height:      70 inches Weight:      163 pounds BMI:     23.47 Pulse rate:   60 / minute Pulse rhythm:   irregular Resp:     18 per minute BP sitting:   120 / 60  (left arm) Cuff size:   large  Vitals Entered By: Vikki Ports (April 12, 2010 12:09 PM)  Physical Exam  General:  Pt is alert and oriented, elderly, African-American male, very pleasant, in no acute distress. HEENT: normal Neck: normal carotid upstrokes with bilateral bruits, JVP normal Lungs: CTA CV: RRR without murmur or gallop Abd: soft, NT, positive BS, no bruit, no organomegaly Ext: no clubbing, cyanosis, or edema. peripheral pulses 2+ and equal Skin: warm and dry without rash    EKG  Procedure date:  04/12/2010  Findings:      NSR 60 bpm, PAC with block, nonspecific ST abnormality  Impression & Recommendations:  Problem # 1:  CORONARY ARTERY DISEASE (ICD-414.00) Pt stable without angina. Continue current medical program  as outlined below.  His updated medication list for this problem includes:    Cartia Xt 120 Mg Cp24 (Diltiazem hcl coated beads) ..... Once daily    Lisinopril 20 Mg Tabs (Lisinopril) ..... One by mouth daily    Aspirin 81 Mg Tbec (Aspirin) .Marland Kitchen... Take 1 tablet by mouth once a day    Warfarin Sodium 5 Mg Tabs (Warfarin sodium) ..... Use as directed by anticoagulation clinic    Toprol Xl 100 Mg Xr24h-tab (Metoprolol succinate) .Marland Kitchen... Take 1 tablet by mouth once a day  Orders: EKG w/ Interpretation  (93000)  Problem # 2:  ATRIAL FIBRILLATION (ICD-427.31) Pt in NSR today. He is not symptomatic at present. Continue anticoagulation with warfarin. If epistaxis worsens advised ENT eval. For now saline nasal spray and lubricant to nares.  His updated medication list for this problem includes:    Aspirin 81 Mg Tbec (Aspirin) .Marland Kitchen... Take 1 tablet by mouth once a day    Warfarin Sodium 5 Mg Tabs (Warfarin sodium) ..... Use as directed by anticoagulation clinic    Toprol Xl 100 Mg Xr24h-tab (Metoprolol succinate) .Marland Kitchen... Take 1 tablet by mouth once a day  Problem # 3:  CAROTID ARTERY STENOSIS, WITHOUT INFARCTION (ICD-433.10) Carotid duplex showed 40-59% bilateral carotid stenosis. Followup duplex August 2012 - continue med regimen as below.  His updated medication list for this problem includes:    Aspirin 81 Mg Tbec (Aspirin) .Marland Kitchen... Take 1 tablet by mouth once a day    Warfarin Sodium 5 Mg Tabs (Warfarin sodium) ..... Use as directed by anticoagulation clinic  Orders: EKG w/ Interpretation (93000)  Patient Instructions: 1)  Your physician recommends that you continue on your current medications as directed. Please refer to the Current Medication list given to you today. 2)  Your physician wants you to follow-up in: 1 YEAR.  You will receive a reminder letter in the mail two months in advance. If you don't receive a letter, please call our office to schedule the follow-up appointment. 3)  Your physician has requested that you have a carotid duplex in August 2012. This test is an ultrasound of the carotid arteries in your neck. It looks at blood flow through these arteries that supply the brain with blood. Allow one hour for this exam. There are no restrictions or special instructions.

## 2010-07-09 NOTE — Miscellaneous (Signed)
Summary: Orders Update  Clinical Lists Changes  Orders: Added new Test order of Carotid Duplex (Carotid Duplex) - Signed 

## 2010-07-09 NOTE — Letter (Signed)
Summary: [facility]  [facility]   Imported By: Maryln Gottron 04/29/2010 10:06:14  _____________________________________________________________________  External Attachment:    Type:   Image     Comment:   External Document

## 2010-07-09 NOTE — Medication Information (Signed)
Summary: rov/eac  Anticoagulant Therapy  Managed by: Bethena Midget, RN, BSN Referring MD: Tonny Bollman PCP: Birdie Sons, MD Supervising MD: Shirlee Latch MD, [place] Indication 1: Atrial Fibrillation (ICD-427.31) Indication 2: TIA (ICD-435.9) Lab Used: [facility] Site: [facility] INR POC 2.0 INR RANGE 2 - 3  Dietary changes: no    Health status changes: no    Bleeding/hemorrhagic complications: yes       Details: Had scant amt of nose bleeding this morning  Recent/future hospitalizations: no    Any changes in medication regimen? no    Recent/future dental: no  Any missed doses?: no       Is patient compliant with meds? yes       Allergies: No Known Drug Allergies  Anticoagulation Management History:      The patient is taking warfarin and comes in today for a routine follow up visit.  Positive risk factors for bleeding include an age of 75 years or older, history of CVA/TIA, and presence of serious comorbidities.  The bleeding index is 'high risk'.  Positive CHADS2 values include History of CHF, Age > 54 years old, and Prior Stroke/CVA/TIA.  Negative CHADS2 values include History of Diabetes.  The start date was 02/16/2002.  His last INR was 3.7.  Anticoagulation responsible provider: Shirlee Latch MD, [place].  INR POC: 2.0.  Cuvette Lot#: [id number].  Exp: 02/2011.    Anticoagulation Management Assessment/Plan:      The patient's current anticoagulation dose is Warfarin sodium 5 mg tabs: Use as directed by Anticoagulation Clinic.  The target INR is 2.0-3.0.  The next INR is due 12/24/2009.  Anticoagulation instructions were given to patient.  Results were reviewed/authorized by Bethena Midget, RN, BSN.  He was notified by Bethena Midget, RN, BSN.         Prior Anticoagulation Instructions: INR 2.1  Continue taking 1/2 tablet on Monday, Wednesday, and Friday, and 1 tablet all other days.  Return to clinic in 4 weeks.     Current Anticoagulation Instructions: INR 2.0 Today take 1 pill  then resume 1 pill everyday excpet 1/2 pill on Mondays, Wednesdays and Fridays. Reheck in 4 weeks.

## 2010-07-09 NOTE — Letter (Signed)
Summary: Appt Reminder 2  [facility]  [address] [place], [place] [address]   Phone: [phone]  Fax: [phone]        August 06, 2009 MRN: [id number]    [facility] [address] [place], [place]  [address]    Dear [name],   You have a return appointment with Dr. Leone Payor on 09/07/09 at 1:45 pm.  Please remember to bring a complete list of the medicines you are taking, your insurance card and your co-pay.  If you have to cancel or reschedule this appointment, please call before 5:00 pm the evening before to avoid a cancellation fee.  If you have any questions or concerns, please call [phone].    Sincerely,    Darcey Nora RN, CGRN  Appended Document: Appt Reminder 2 letter mailed to patient's home

## 2010-07-09 NOTE — Assessment & Plan Note (Signed)
Summary: CPX (PT WILL COME IN FASTING) // RS   Vital Signs:  Patient profile:   77 year old male Height:      70 inches Weight:      155 pounds BMI:     22.32 Pulse rate:   64 / minute Resp:     12 per minute BP sitting:   118 / 60  (left arm)  Vitals Entered By: Gladis Riffle, RN (July 16, 2009 9:18 AM) CC: annual review of systems, fasting Is Patient Diabetic? No   Primary Care Provider:  Birdie Sons, MD  CC:  annual review of systems and fasting.  History of Present Illness:  Follow-Up Visit      This is a 77 year old man who presents for Follow-up visit.  The patient denies chest pain and palpitations.  Since the last visit the patient notes no new problems or concerns.  The patient reports taking meds as prescribed.  When questioned about possible medication side effects, the patient notes none.    All other systems reviewed and were negative   Preventive Screening-Counseling & Management  Alcohol-Tobacco     Smoking Status: quit     Year Quit: 2004  Current Problems (verified): 1)  Carotid Artery Stenosis, Without Infarction  (ICD-433.10) 2)  Abnormal Prostate On Pet Scan  (ICD-602.9) 3)  Carcinoma, Lung, Squamous Cell  (ICD-162.9) 4)  Coumadin Therapy  (ICD-V58.61) 5)  Kidney Disease, Chronic, Stage II  (ICD-585.2) 6)  Anemia-nos  (ICD-285.9) 7)  Congestive Heart Failure  (ICD-428.0) 8)  Coronary Artery Disease  (ICD-414.00) 9)  Transient Ischemic Attack, Hx of  (ICD-V12.50) 10)  Rheumatoid Arthritis  (ICD-714.0) 11)  Osteopenia  (ICD-733.90) 12)  Myocardial Infarction, Hx of  (ICD-412) 13)  Hyperlipidemia  (ICD-272.4) 14)  Colonic Polyps, Hx of  (ICD-V12.72) 15)  Atrial Fibrillation  (ICD-427.31)  Current Medications (verified): 1)  Cartia Xt 120 Mg Cp24 (Diltiazem Hcl Coated Beads) .... Once Daily 2)  Lisinopril 20 Mg  Tabs (Lisinopril) .... One By Mouth Daily 3)  Flomax 0.4 Mg  Cp24 (Tamsulosin Hcl) .... One Q Day 4)  Alendronate Sodium 70 Mg  Tabs  (Alendronate Sodium) .Marland Kitchen.. 1 By Mouth Q Week . Take With 8 Ounces of Water On and Empty Stomach 5)  Folic Acid 400 Mcg  Tabs (Folic Acid) .... One By Mouth Daily 6)  Calcium 600/vitamin D 600-200 Mg-Unit  Tabs (Calcium Carbonate-Vitamin D) .... Once Daily 7)  Simvastatin 40 Mg Tabs (Simvastatin) .... Take One Tablet At [facility] Visit For Additional Refills         Take 1 Tablet By Mouth At Bedtime 8)  Finasteride 5 Mg Tabs (Finasteride) .... Take 1 Tablet By Mouth Once A Day 9)  Aspirin 81 Mg Tbec (Aspirin) .... Take 1 Tablet By Mouth Once A Day 10)  Ferrous Sulfate 325 (65 Fe) Mg Tabs (Ferrous Sulfate) .... Take 1 Tablet By Mouth Two Times A Day 11)  Warfarin Sodium 5 Mg Tabs (Warfarin Sodium) .... Use As Directed By Anticoagulation Clinic 12)  Toprol Xl 100 Mg Xr24h-Tab (Metoprolol Succinate) .... Take 1 Tablet By Mouth Once A Day 13)  Tylenol 325 Mg Tabs (Acetaminophen) .... As Needed  Allergies (verified): No Known Drug Allergies  Past History:  Past Medical History: Last updated: 04/10/2009 Atrial fibrillation, paroxysmal Colonic polyps, hx of 2005 6 mm adenoma Hyperlipidemia Myocardial infarction, hx of Osteopenia-long time steroids Rheumatoid arthritis Transient ischemic attack, hx of-likely secondary Coronary artery disease status post  CABG chronic A.C. (coumadin clinic) Anemia-NOS Peripheral arterial disease  Past Surgical History: Last updated: 09/01/2008 angioplasty 1995 CABG 01/31/02 R femoral artery occlusion-- embolectomy Right femoral to bellow-knee popliteal artery bypass graft on 01/13/ 2010 Electrophysiology study and radiofrequency catheter  ablation of atrial flutter.  Family History: Last updated: 09/01/2008 No FH of Colon Cancer: none contributory  Social History: Last updated: 02/16/2009 Regular exercise-no Occupation: Former Naval architect Patient is a former smoker. -stopped 2003 Alcohol Use - no Daily Caffeine Use-1/2 cup  daily Illicit Drug Use - no Patient gets regular exercise.  Risk Factors: Exercise: yes (08/21/2008)  Risk Factors: Smoking Status: quit (07/16/2009)  Review of Systems       All other systems reviewed and were negative   Physical Exam  General:  alert and well-developed.   Head:  normocephalic and atraumatic.   Eyes:  pupils equal and pupils round.   Ears:  R ear normal and L ear normal.   Nose:  no external deformity and no external erythema.   Neck:  No deformities, masses, or tenderness noted. Chest Wall:  No deformities, masses, tenderness or gynecomastia noted. Lungs:  normal respiratory effort and no intercostal retractions.   Heart:  normal rate and regular rhythm.   Abdomen:  soft and nontender, BS + and no mass. Msk:  normal ROM and no joint tenderness.   Pulses:  R radial normal and L radial normal.   Neurologic:  cranial nerves II-XII intact and gait normal.   Skin:  turgor normal and color normal.   Psych:  memory intact for recent and remote and good eye contact.     Impression & Recommendations:  Problem # 1:  Preventive Health Care (ICD-V70.0)  health maint utd  Colonoscopy: Results: Polyp. Tubular Adenoma Results: Diverticulosis.       Location:  [facility].   (07/04/2003) Td Booster: Td (07/16/2009)   Pneumovax: Pneumovax (01/08/2000) Chol: 158 (04/12/2009)   HDL: 42.80 (04/12/2009)   LDL: 97 (04/12/2009)   TG: 92.0 (04/12/2009) Next Colonoscopy due:: 06/2008 (07/04/2003)  Orders: EMR Misc Charge Code [facility])  Problem # 2:  CARCINOMA, LUNG, SQUAMOUS CELL (ICD-162.9)  followed by oncology  Orders: Prescription Created Electronically [phone])  Problem # 3:  COUMADIN THERAPY (ICD-V58.61) monthly protime  Problem # 4:  CONGESTIVE HEART FAILURE (ICD-428.0)  clinically stable His updated medication list for this problem includes:    Lisinopril 20 Mg Tabs (Lisinopril) ..... One by mouth daily    Aspirin 81 Mg Tbec (Aspirin)  .Marland Kitchen... Take 1 tablet by mouth once a day    Warfarin Sodium 5 Mg Tabs (Warfarin sodium) ..... Use as directed by anticoagulation clinic    Toprol Xl 100 Mg Xr24h-tab (Metoprolol succinate) .Marland Kitchen... Take 1 tablet by mouth once a day  LVEF: 44 (08/20/2004)  Echocardiogram: normal sinus rhythm Incomplete right bundle-branch block Nonspecific ST abnormality. (09/07/2008)  Orders: Venipuncture (66063) TLB-BMP (Basic Metabolic Panel-BMET) (80048-METABOL) Prescription Created Electronically [phone])  Problem # 5:  ANEMIA-NOS (ICD-285.9)  check today His updated medication list for this problem includes:    Folic Acid 400 Mcg Tabs (Folic acid) ..... One by mouth daily    Ferrous Sulfate 325 (65 Fe) Mg Tabs (Ferrous sulfate) .Marland Kitchen... Take 1 tablet by mouth two times a day  Hgb: 8.9 (09/10/2007)   Hct: 29.1 (09/10/2007)   Platelets: 286 (09/10/2007) RBC: 3.66 (09/10/2007)   RDW: 17.4 (09/10/2007)   WBC: 6.5 (09/10/2007) MCV: 79.5 (09/10/2007)   MCHC: 30.5 (09/10/2007) Ferritin: 13.8 (04/29/2007) Iron:  26 (04/29/2007)   B12: 474 (04/29/2007)   Folate: 15.2 (04/29/2007)     Orders: TLB-CBC Platelet - w/Differential (85025-CBCD)  Problem # 6:  HYPERLIPIDEMIA (ICD-272.4)  check labs today His updated medication list for this problem includes:    Simvastatin 40 Mg Tabs (Simvastatin) .Marland Kitchen... Take one tablet at bedtime--needs office visit for additional refills         take 1 tablet by mouth at bedtime  Labs Reviewed: SGOT: 13 (04/12/2009)   SGPT: 9 (04/12/2009)  Lipid Goals: Chol Goal: 200 (02/01/2007)   HDL Goal: 40 (02/01/2007)   LDL Goal: 100 (02/01/2007)   TG Goal: 150 (02/01/2007)  Prior 10 Yr Risk Heart Disease: N/A (02/01/2007)   HDL:42.80 (04/12/2009), 73.9 (04/27/2007)  LDL:97 (04/12/2009), 112 (16/03/9603)  Chol:158 (04/12/2009), 199 (04/27/2007)  Trig:92.0 (04/12/2009), 64 (04/27/2007)  Orders: TLB-Lipid Panel (80061-LIPID) TLB-Hepatic/Liver Function Pnl (80076-HEPATIC) TLB-TSH  (Thyroid Stimulating Hormone) (54098-JXB) Prescription Created Electronically [phone])  Complete Medication List: 1)  Cartia Xt 120 Mg Cp24 (Diltiazem hcl coated beads) .... Once daily 2)  Lisinopril 20 Mg Tabs (Lisinopril) .... One by mouth daily 3)  Flomax 0.4 Mg Cp24 (Tamsulosin hcl) .... One q day 4)  Alendronate Sodium 70 Mg Tabs (Alendronate sodium) .Marland Kitchen.. 1 by mouth q week . take with 8 ounces of water on and empty stomach 5)  Folic Acid 400 Mcg Tabs (Folic acid) .... One by mouth daily 6)  Calcium 600/vitamin D 600-200 Mg-unit Tabs (Calcium carbonate-vitamin d) .... Once daily 7)  Simvastatin 40 Mg Tabs (Simvastatin) .... Take one tablet at bedtime--needs office visit for additional refills         take 1 tablet by mouth at bedtime 8)  Finasteride 5 Mg Tabs (Finasteride) .... Take 1 tablet by mouth once a day 9)  Aspirin 81 Mg Tbec (Aspirin) .... Take 1 tablet by mouth once a day 10)  Ferrous Sulfate 325 (65 Fe) Mg Tabs (Ferrous sulfate) .... Take 1 tablet by mouth two times a day 11)  Warfarin Sodium 5 Mg Tabs (Warfarin sodium) .... Use as directed by anticoagulation clinic 12)  Toprol Xl 100 Mg Xr24h-tab (Metoprolol succinate) .... Take 1 tablet by mouth once a day 13)  Tylenol 325 Mg Tabs (Acetaminophen) .... As needed  Other Orders: TD Toxoids IM 7 YR + (95621) Admin 1st Vaccine (30865) TLB-PSA (Prostate Specific Antigen) (84153-PSA)  Patient Instructions: 1)  Please schedule a follow-up appointment in 6 months. Prescriptions: TOPROL XL 100 MG XR24H-TAB (METOPROLOL SUCCINATE) Take 1 tablet by mouth once a day  #90 x 3   Entered and Authorized by:   Birdie Sons MD   Signed by:   Birdie Sons MD on 07/16/2009   Method used:   Electronically to        [facility] [phone]* (retail)       [address] [place]  [address]       Ph: [phone] or [phone]       Fax: [phone]   RxID:   [id number] FERROUS SULFATE 325 (65 FE) MG TABS (FERROUS SULFATE)  Take 1 tablet by mouth two times a day  #180 x 3   Entered and Authorized by:   Birdie Sons MD   Signed by:   Birdie Sons MD on 07/16/2009   Method used:   Electronically to        [facility] [phone]* (retail)       [phone]  [place], [place]  [address]       Ph: [phone] or [phone]       Fax: [phone]   RxID:   [id number] FINASTERIDE 5 MG TABS (FINASTERIDE) Take 1 tablet by mouth once a day  #90 x 3   Entered and Authorized by:   Birdie Sons MD   Signed by:   Birdie Sons MD on 07/16/2009   Method used:   Electronically to        [facility]. [phone]* (retail)       [address], [place]  [address]       Ph: [phone] or [phone]       Fax: [phone]   RxID:   [id number] SIMVASTATIN 40 MG TABS (SIMVASTATIN) Take one tablet at bedtime--NEEDS OFFICE VISIT FOR ADDITIONAL REFILLS         Take 1 tablet by mouth at bedtime  #90 x 3   Entered and Authorized by:   Birdie Sons MD   Signed by:   Birdie Sons MD on 07/16/2009   Method used:   [facility]ron[facility] to        [facility] [phone]* (retail)       [address], [place]  [address]       Ph: [phone] or [phone]       Fax: [phone]   RxID:   [id number] ALENDRONATE SODIUM 70 MG  TABS (ALENDRONATE SODIUM) 1 by mouth q week . Take with 8 ounces of water on and empty stomach  #12 Tablet x 4   Entered and Authorized by:   Birdie Sons MD   Signed by:   Birdie Sons MD on 07/16/2009   Method used:   [facility] to        [facility] [phone]* (retail)       [address], [place]  [address]       Ph: [phone] or [phone]       Fax: [phone]   RxID:   [id number] FLOMAX 0.4 MG  CP24 (TAMSULOSIN HCL) one q day  #90 x 3   Entered and Authorized by:   Birdie Sons MD   Signed by:   Birdie Sons MD on 07/16/2009   Method used:   Electronically to        [facility] [phone]* (retail)       [address], [place]  [address]       Ph: [phone] or [phone]       Fax: [phone]   RxID:   [id number] LISINOPRIL 20 MG  TABS (LISINOPRIL) one by mouth daily  #90 Tablet x 3   Entered and Authorized by:   Birdie Sons MD   Signed by:   Birdie Sons MD on 07/16/2009   Method used:   [facility]ron[facility] to        [facility]. [phone]* (retail)       [address], [place]  [address]       Ph: [phone] or [phone]       Fax: [phone]   RxID:   [id number] CARTIA XT 120 MG CP24 (DILTIAZEM HCL COATED BEADS) once daily  #90  x 3   Entered and Authorized by:   Birdie Sons MD   Signed by:   Birdie Sons MD on 07/16/2009   Method used:   [facility]ron[facility] to        [facility]. [phone]* (retail)       [address] [place]  [address]       Ph: [phone] or [phone]       Fax: [phone]   RxID:   [id number]    Immunizations Administered:  Tetanus Vaccine:    Vaccine Type: Td    Site: left deltoid    Mfr: Sanofi Pasteur    Dose: 0.5 ml    Route: IM    Given by: Gladis Riffle, RN    Exp. Date: 09/20/2010    Lot #: [id number]    Prevention & Chronic Care Immunizations   Influenza vaccine: Not documented   Influenza vaccine deferral: Not available  (07/16/2009)    Tetanus booster: 07/16/2009: Td    Pneumococcal vaccine: Pneumovax  (01/08/2000)   Pneumococcal vaccine due: 07/16/2014    H. zoster vaccine: Not documented  Colorectal Screening   Hemoccult: Not documented    Colonoscopy: Results: Polyp. Tubular Adenoma Results: Diverticulosis.       Location:  [facility].    (07/04/2003)   Colonoscopy due: 06/2008  Other Screening   PSA: Not documented   PSA ordered.   Smoking status: quit  (07/16/2009)  Lipids   Total Cholesterol: 158  (04/12/2009)   LDL: 97  (04/12/2009)   LDL Direct: 125.3  (01/22/2007)   HDL: 42.80  (04/12/2009)   Triglycerides: 92.0  (04/12/2009)    SGOT (AST): 13   (04/12/2009)   SGPT (ALT): 9  (04/12/2009)   Alkaline phosphatase: 66  (04/12/2009)   Total bilirubin: 0.9  (04/12/2009)    Lipid flowsheet reviewed?: Yes   Progress toward LDL goal: At goal  Self-Management Support :    Lipid self-management support: Not documented    Nursing Instructions: Give Pneumovax today    Appended Document: CPX (PT WILL COME IN FASTING) // RS   Immunizations Administered:  Pneumonia Vaccine:    Vaccine Type: Pneumovax (Medicare)    Site: right deltoid    Mfr: Merck    Dose: 0.5 ml    Route: IM    Given by: Gladis Riffle, RN    Exp. Date: 07/05/2010    Lot #: [id number]

## 2010-07-09 NOTE — Letter (Signed)
[facility]   Imported By: Maryln Gottron 08/21/2009 09:51:07  _____________________________________________________________________  External Attachment:    Type:   Image     Comment:   External Document

## 2010-07-09 NOTE — Medication Information (Signed)
Summary: rov/eh  Anticoagulant Therapy  Managed by: Bethena Midget, RN, BSN Referring MD: Tonny Bollman PCP: Birdie Sons, MD Supervising MD: Excell Seltzer MD, Casimiro Needle Indication 1: Atrial Fibrillation (ICD-427.31) Indication 2: TIA (ICD-435.9) Lab Used: [facility] Site: [facility] INR POC 2.0 INR RANGE 2 - 3  Dietary changes: no    Health status changes: no    Bleeding/hemorrhagic complications: no    Recent/future hospitalizations: no    Any changes in medication regimen? no    Recent/future dental: no  Any missed doses?: no       Is patient compliant with meds? yes       Allergies: No Known Drug Allergies  Anticoagulation Management History:      The patient is taking warfarin and comes in today for a routine follow up visit.  Positive risk factors for bleeding include an age of 31 years or older, history of CVA/TIA, and presence of serious comorbidities.  The bleeding index is 'high risk'.  Positive CHADS2 values include History of CHF, Age > 80 years old, and Prior Stroke/CVA/TIA.  Negative CHADS2 values include History of Diabetes.  The start date was 02/16/2002.  His last INR was 3.7.  Anticoagulation responsible provider: Excell Seltzer MD, Casimiro Needle.  INR POC: 2.0.  Cuvette Lot#: [id number].  Exp: 09/2010.    Anticoagulation Management Assessment/Plan:      The patient's current anticoagulation dose is Warfarin sodium 5 mg tabs: Use as directed by Anticoagulation Clinic.  The target INR is 2.0-3.0.  The next INR is due 08/06/2009.  Anticoagulation instructions were given to patient.  Results were reviewed/authorized by Bethena Midget, RN, BSN.  He was notified by Bethena Midget, RN, BSN.         Prior Anticoagulation Instructions: INR 3.3  Skip today's dose then decrease dose to 0.5 tablet daily then 1 tablet Sundays, Tuesdays, and Thursdays. Recheck in 3 weeks.  Current Anticoagulation Instructions: INR 2.0 Today take 1 pill then Continue 1/2 pill everyday excpet 1 pill on  Tuesdays, Thursdays and Sundays. Recheck in 3 weeks.

## 2010-07-09 NOTE — Letter (Signed)
[facility]   Imported By: Maryln Gottron 10/15/2009 11:08:07  _____________________________________________________________________  External Attachment:    Type:   Image     Comment:   External Document

## 2010-07-09 NOTE — Letter (Signed)
Summary: Anticoagulation Modification Letter  [facility]  [address] [place], [place] [address]   Phone: [phone]  Fax: [phone]    September 07, 2009  Re:    GULED GAHAN [facility] DOB:    [birth year] MRN:    [id number]    Dear Dr Murrell Converse,  We have scheduled the above patient for an endoscopic procedure. Our records show that  he/she is on anticoagulation therapy. Please advise as to how long the patient may come off their therapy of warfarin  prior to the scheduled procedure(s) on 09/26/2009.   Please fax back/or route the completed form to Stephanie/Sheri  at [phone].  Thank you for your help with this matter.  Sincerely,   Denice Paradise    Merri Ray CMA Duncan Dull)   Physician Recommendation:  Hold Plavix 7 days prior ________________  Hold Coumadin 5 days prior ____________  Other ______________________________    Appended Document: Anticoagulation Modification Letter Fine to hold Coumadin 5-7 days prior to procedure. Resume following procedure when ok with Dr Leone Payor. thx  Appended Document: Anticoagulation Modification Letter Patient  notified that he will need to hold his coumadin starting 09-21-09.  Patient  verbalized understanding

## 2010-07-09 NOTE — Medication Information (Signed)
Summary: rov/ln  Anticoagulant Therapy  Managed by: Cloyde Reams, RN, BSN Referring MD: Tonny Bollman PCP: Birdie Sons, MD Supervising MD: Eden Emms MD, Theron Arista Indication 1: Atrial Fibrillation (ICD-427.31) Indication 2: TIA (ICD-435.9) Lab Used: [facility] Site: [facility] INR POC 2.0 INR RANGE 2 - 3           Allergies: No Known Drug Allergies  Anticoagulation Management History:      The patient is taking warfarin and comes in today for a routine follow up visit.  Positive risk factors for bleeding include an age of 75 years or older, history of CVA/TIA, and presence of serious comorbidities.  The bleeding index is 'high risk'.  Positive CHADS2 values include History of CHF, Age > 14 years old, and Prior Stroke/CVA/TIA.  Negative CHADS2 values include History of Diabetes.  The start date was 02/16/2002.  His last INR was 3.7.  Anticoagulation responsible provider: Eden Emms MD, Theron Arista.  INR POC: 2.0.  Cuvette Lot#: [id number].  Exp: 03/2011.    Anticoagulation Management Assessment/Plan:      The patient's current anticoagulation dose is Warfarin sodium 5 mg tabs: Use as directed by Anticoagulation Clinic.  The target INR is 2.0-3.0.  The next INR is due 02/06/2010.  Anticoagulation instructions were given to patient.  Results were reviewed/authorized by Cloyde Reams, RN, BSN.  He was notified by Cloyde Reams RN.         Prior Anticoagulation Instructions: INR 1.7  Take 1 tab today and then change to 1 tab daily except 1/2 tab on Monday and Friday.  Re-check INR in 2 weeks.  Current Anticoagulation Instructions: INR 2.0  Continue on same dosage 1 tablet daily except 1/2 tablet on Mondays and Fridays.  Recheck in 3-4 weeks.

## 2010-07-09 NOTE — Assessment & Plan Note (Signed)
Summary: rev recall/[name]   History of Present Illness Visit Type: Follow-up Visit Primary GI MD: Stan Head MD Primary Provider: Birdie Sons, MD Chief Complaint: follow-up anemia pt. denies any GI Complaints at this time History of Present Illness:   77 yo African-American man with history of small colonic adenoma in past. We have been deferring a screening/surveillance colonoscopy due to Stage IB lung cancer. That is in remission at this point, however. Additionally, he is now known to be iron-deficient with anemia. Some component of chronic disease anemia also suspected and is on aranesp.   GI Review of Systems      Denies abdominal pain, acid reflux, belching, bloating, chest pain, dysphagia with liquids, dysphagia with solids, heartburn, loss of appetite, nausea, vomiting, vomiting blood, weight loss, and  weight gain.        Denies anal fissure, black tarry stools, change in bowel habit, constipation, diarrhea, diverticulosis, fecal incontinence, heme positive stool, hemorrhoids, irritable bowel syndrome, jaundice, light color stool, liver problems, rectal bleeding, and  rectal pain.    Current Medications (verified): 1)  Cartia Xt 120 Mg Cp24 (Diltiazem Hcl Coated Beads) .... Once Daily 2)  Lisinopril 20 Mg  Tabs (Lisinopril) .... One By Mouth Daily 3)  Flomax 0.4 Mg  Cp24 (Tamsulosin Hcl) .... One Q Day 4)  Alendronate Sodium 70 Mg  Tabs (Alendronate Sodium) .Marland Kitchen.. 1 By Mouth Q Week . Take With 8 Ounces of Water On and Empty Stomach 5)  Folic Acid 400 Mcg  Tabs (Folic Acid) .... One By Mouth Daily 6)  Calcium 600/vitamin D 600-200 Mg-Unit  Tabs (Calcium Carbonate-Vitamin D) .... Once Daily 7)  Simvastatin 40 Mg Tabs (Simvastatin) .... Take One Tablet At [facility] Visit For Additional Refills         Take 1 Tablet By Mouth At Bedtime 8)  Finasteride 5 Mg Tabs (Finasteride) .... Take 1 Tablet By Mouth Once A Day 9)  Aspirin 81 Mg Tbec (Aspirin) .... Take 1 Tablet By  Mouth Once A Day 10)  Ferrous Sulfate 325 (65 Fe) Mg Tabs (Ferrous Sulfate) .... Take 1 Tablet By Mouth Two Times A Day 11)  Warfarin Sodium 5 Mg Tabs (Warfarin Sodium) .... Use As Directed By Anticoagulation Clinic 12)  Toprol Xl 100 Mg Xr24h-Tab (Metoprolol Succinate) .... Take 1 Tablet By Mouth Once A Day 13)  Tylenol 325 Mg Tabs (Acetaminophen) .... As Needed  Allergies (verified): No Known Drug Allergies  Past History:  Past Medical History: Reviewed history from 04/10/2009 and no changes required. Atrial fibrillation, paroxysmal Colonic polyps, hx of 2005 6 mm adenoma Hyperlipidemia Myocardial infarction, hx of Osteopenia-long time steroids Rheumatoid arthritis Transient ischemic attack, hx of-likely secondary Coronary artery disease status post CABG chronic A.C. (coumadin clinic) Anemia-NOS Peripheral arterial disease  Past Surgical History: Reviewed history from 09/01/2008 and no changes required. angioplasty 1995 CABG 01/31/02 R femoral artery occlusion-- embolectomy Right femoral to bellow-knee popliteal artery bypass graft on 01/13/ 2010 Electrophysiology study and radiofrequency catheter  ablation of atrial flutter.  Family History: Reviewed history from 09/01/2008 and no changes required. No FH of Colon Cancer: none contributory  Social History: Reviewed history from 02/16/2009 and no changes required. Regular exercise-no Occupation: Former Naval architect Patient is a former smoker. -stopped 2003 Alcohol Use - no Daily Caffeine Use-1/2 cup daily Illicit Drug Use - no Patient gets regular exercise.  Vital Signs:  Patient profile:   77 year old male Height:      70 inches Weight:  157.25 pounds BMI:     22.64 Pulse rate:   72 / minute Pulse rhythm:   regular BP sitting:   112 / 56  (left arm)  Vitals Entered By: Milford Cage NCMA (September 07, 2009 1:34 PM)  Physical Exam  General:  elderly NAD Eyes:  anicteric Lungs:  clear Heart:   normal rate and regular rhythm.  no murmur Abdomen:  soft and nontender, BS + and no mass or HSM   Impression & Recommendations:  Problem # 1:  ANEMIA, IRON DEFICIENCY (ICD-280.9) Assessment New  Ferritin was 27 recent Hgb 10.9 he is on EPO and iron and this is likely multifactorial I had held off on colonoscopy due to recent lng cancer in 2009 but he is in remission and I think it is appropriate to search for causes of occult blood loss which he is mre prone to given wrafarin therapy. Risks, benefits,and indications of endoscopic procedure(s) were reviewed with the patient and all questions answered.  Orders: Colon/Endo (Colon/Endo)  Problem # 2:  COUMADIN THERAPY (ICD-V58.61) For Afib and ? PVD will hold 4-5 days pior to colonoscopy if cleared by cardiology Excell Seltzer)  Problem # 3:  COLONIC POLYPS, HX OF (ICD-V12.72) Assessment: Unchanged 6 mm adenoma removed 1/05.  Patient Instructions: 1)  Copy sent to : Donia Guiles, MD; Eli Hose, MD; Cari Caraway, MD 2)  Your Colonoscopy/Endoscopy is scheduled for 09/26/2009 at 3pm 3)  We will contact you about your Warfarin as soon as we hear from Dr Excell Seltzer 4)  We are sending in your MoviPrep to your pharmacy today 5)  The medication list was reviewed and reconciled.  All changed / newly prescribed medications were explained.  A complete medication list was provided to the patient / caregiver. Prescriptions: MOVIPREP 100 GM  SOLR (PEG-KCL-NACL-NASULF-NA ASC-C) As per prep instructions.  #1 x 0   Entered by:   Merri Ray CMA (AAMA)   Authorized by:   Iva Boop MD, [facility]   Signed by:   Merri Ray CMA (AAMA) on 09/07/2009   Method used:   Electronically to        [facility] [phone]* (retail)       [address]       [place], [place]  [address]       Ph: [phone] or [phone]       Fax: [phone]   RxID:   [id number]  cc: Tonny Bollman, MD

## 2010-07-09 NOTE — Medication Information (Signed)
Summary: rov/mw  Anticoagulant Therapy  Managed by: Leota Sauers, PharmD, BCPS, CPP Referring MD: Tonny Bollman PCP: Birdie Sons, MD Supervising MD: Gala Romney MD, Daniel Indication 1: Atrial Fibrillation (ICD-427.31) Indication 2: TIA (ICD-435.9) Lab Used: [facility] Site: [facility] INR POC 1.9 INR RANGE 2 - 3  Dietary changes: no    Health status changes: no    Bleeding/hemorrhagic complications: yes       Details: Pt complains of nose bleeds, which usually stop after a few minutes, but did have one on 10/25 which lasted 10 hours. He has been nasal saline spray each morning and at evening.     Recent/future hospitalizations: no    Any changes in medication regimen? no    Recent/future dental: no  Any missed doses?: no       Is patient compliant with meds? yes      Comments: Recommended nasal saline or vasoline around nares for nose bleeds.    Allergies: No Known Drug Allergies  Anticoagulation Management History:      The patient is taking warfarin and comes in today for a routine follow up visit.  Positive risk factors for bleeding include an age of 37 years or older, history of CVA/TIA, and presence of serious comorbidities.  The bleeding index is 'high risk'.  Positive CHADS2 values include History of CHF, Age > 51 years old, and Prior Stroke/CVA/TIA.  Negative CHADS2 values include History of Diabetes.  The start date was 02/16/2002.  His last INR was 3.7.  Anticoagulation responsible provider: Bensimhon MD, Reuel Boom.  INR POC: 1.9.  Cuvette Lot#: [id number].  Exp: 04/2011.    Anticoagulation Management Assessment/Plan:      The patient's current anticoagulation dose is Warfarin sodium 5 mg tabs: Use as directed by Anticoagulation Clinic.  The target INR is 2.0-3.0.  The next INR is due 04/24/2010.  Anticoagulation instructions were given to patient.  Results were reviewed/authorized by Leota Sauers, PharmD, BCPS, CPP.  He was notified by Haynes Hoehn, PharmD Candidate.           Prior Anticoagulation Instructions: INR 2.1 Continue taking a half tablet on monday and friday. And 1 tablet all other days. Recheck in 4 weeks.   Current Anticoagulation Instructions: INR 1.9  Increase to 1 tablet on Friday, then take Coumadin 1 tablet every day of the week, except 1/2 tablet on Monday.  Return to clinic in 2-3 weeks.

## 2010-07-09 NOTE — Procedures (Signed)
Summary: Colonoscopy  Patient: [name] Note: All result statuses are Final unless otherwise noted.  Tests: (1) Colonoscopy (COL)   COL Colonoscopy           DONE     [facility]     [address]     [place], [place]  [address]           COLONOSCOPY PROCEDURE REPORT           PATIENT:  Logan, Hill  MR#:  [id number]     BIRTHDATE:  05/28/1932, 77 yrs. old  GENDER:  male     ENDOSCOPIST:  Iva Boop, MD, [facility]           PROCEDURE DATE:  09/26/2009     PROCEDURE:  Colonoscopy [id number]     ASA CLASS:  Class III     INDICATIONS:  anemia ferritin 27 so probably at least some iron     deficiency     MEDICATIONS:   Versed 2 mg IV, There was residual sedation effect     present from prior procedure.           DESCRIPTION OF PROCEDURE:   After the risks benefits and     alternatives of the procedure were thoroughly explained, informed     consent was obtained.  Digital rectal exam was performed and     revealed no abnormalities.   The LB CF-[id number] endoscope     was introduced through the anus and advanced to the cecum, which     was identified by both the appendix and ileocecal valve, without     limitations.  The quality of the prep was excellent, using     MoviPrep.  The instrument was then slowly withdrawn as the colon     was fully examined. Insertion: 3:17 minutes Withdrawal: 5:03     minutes     <<PROCEDUREIMAGES>>           FINDINGS:  Moderate diverticulosis was found in the right colon.     This was otherwise a normal examination of the colon.   Retroflexed     views in the rectum revealed no abnormalities.    The scope was     then withdrawn from the patient and the procedure completed.           COMPLICATIONS:  None     ENDOSCOPIC IMPRESSION:     1) Moderate diverticulosis in the right colon     2) Otherwise normal examination           REPEAT EXAM:  In for as needed.           Iva Boop, MD, Clementeen Graham           CC:  Lindley Magnus, MD,  Micheline Chapman, MD, The Patient           n.     Rosalie Doctor:   Iva Boop at 09/26/2009 03:50 PM           Ronnette Juniper, [id number]  Note: An exclamation mark (!) indicates a result that was not dispersed into the flowsheet. Document Creation Date: 09/26/2009 3:51 PM _______________________________________________________________________  (1) Order result status: Final Collection or observation date-time: 09/26/2009 15:28 Requested date-time:  Receipt date-time:  Reported date-time:  Referring Physician:   Ordering Physician: Stan Head [id number]) Specimen Source:  Source: Launa Grill Order Number: [id number] Lab site:   Appended Document: Colonoscopy   Colonoscopy  Procedure  date:  09/26/2009  Findings:          1) Two gastric antral ulcers - biopsied     2) Antral gastritis     3) Otherwise normal examination  Comments:      Follow-up prn

## 2010-07-09 NOTE — Medication Information (Signed)
Summary: rov/tm  Anticoagulant Therapy  Managed by: Lyna Poser, PharmD Referring MD: Tonny Bollman PCP: Birdie Sons, MD Supervising MD: Myrtis Ser MD, Tinnie Gens Indication 1: Atrial Fibrillation (ICD-427.31) Indication 2: TIA (ICD-435.9) Lab Used: [facility] Site: [facility] INR POC 2.1 INR RANGE 2 - 3  Dietary changes: no    Health status changes: no    Bleeding/hemorrhagic complications: no    Recent/future hospitalizations: no    Any changes in medication regimen? no    Recent/future dental: no  Any missed doses?: no       Is patient compliant with meds? yes       Allergies: No Known Drug Allergies  Anticoagulation Management History:      Positive risk factors for bleeding include an age of 82 years or older, history of CVA/TIA, and presence of serious comorbidities.  The bleeding index is 'high risk'.  Positive CHADS2 values include History of CHF, Age > 59 years old, and Prior Stroke/CVA/TIA.  Negative CHADS2 values include History of Diabetes.  The start date was 02/16/2002.  His last INR was 3.7.  Anticoagulation responsible provider: Myrtis Ser MD, Tinnie Gens.  INR POC: 2.1.  Exp: 03/2011.    Anticoagulation Management Assessment/Plan:      The patient's current anticoagulation dose is Warfarin sodium 5 mg tabs: Use as directed by Anticoagulation Clinic.  The target INR is 2.0-3.0.  The next INR is due 04/04/2010.  Anticoagulation instructions were given to patient.  Results were reviewed/authorized by Lyna Poser, PharmD.         Prior Anticoagulation Instructions: INR 2.0 Today take 1 1/2 pills then continue 1 pill everyday except 1/2 pill on Mondays and Fridays. Recheck in 4 weeks.   Current Anticoagulation Instructions: INR 2.1 Continue taking a half tablet on monday and friday. And 1 tablet all other days. Recheck in 4 weeks.

## 2010-07-09 NOTE — Letter (Signed)
Summary: [facility]  [facility]   Imported By: Maryln Gottron 10/30/2009 13:53:56  _____________________________________________________________________  External Attachment:    Type:   Image     Comment:   External Document

## 2010-07-09 NOTE — Letter (Signed)
Summary: [facility] Instructions  [facility]  [address] [place], [place] [address]   Phone: [phone]  Fax: [phone]       [name]    04/16/1932    MRN: [id number]        Procedure Day /Date:WEDNESDAY  09/26/2009     Arrival Time:2PM     Procedure Time:3PM     Location of Procedure:                    X  [facility] (4th Floor)                        PREPARATION FOR COLONOSCOPY WITH MOVIPREP/EGD   Starting 5 days prior to your procedure 09/21/2009 do not eat nuts, seeds, popcorn, corn, beans, peas,  salads, or any raw vegetables.  Do not take any fiber supplements (e.g. Metamucil, Citrucel, and Benefiber).  THE DAY BEFORE YOUR PROCEDURE         DATE: 09/25/2009  DAY: TUESDAY  1.  Drink clear liquids the entire day-NO SOLID FOOD  2.  Do not drink anything colored red or purple.  Avoid juices with pulp.  No orange juice.  3.  Drink at least 64 oz. (8 glasses) of fluid/clear liquids during the day to prevent dehydration and help the prep work efficiently.  CLEAR LIQUIDS INCLUDE: Water Jello Ice Popsicles Tea (sugar ok, no milk/cream) Powdered fruit flavored drinks Coffee (sugar ok, no milk/cream) Gatorade Juice: apple, white grape, white cranberry  Lemonade Clear bullion, consomm, broth Carbonated beverages (any kind) Strained chicken noodle soup Hard Candy                             4.  In the morning, mix first dose of MoviPrep solution:    Empty 1 Pouch A and 1 Pouch B into the disposable container    Add lukewarm drinking water to the top line of the container. Mix to dissolve    Refrigerate (mixed solution should be used within 24 hrs)  5.  Begin drinking the prep at 5:00 p.m. The MoviPrep container is divided by 4 marks.   Every 15 minutes drink the solution down to the next mark (approximately 8 oz) until the full liter is complete.   6.  Follow completed prep with 16 oz of clear liquid of your choice (Nothing  red or purple).  Continue to drink clear liquids until bedtime.  7.  Before going to bed, mix second dose of MoviPrep solution:    Empty 1 Pouch A and 1 Pouch B into the disposable container    Add lukewarm drinking water to the top line of the container. Mix to dissolve    Refrigerate  THE DAY OF YOUR PROCEDURE      DATE:09/26/2009 DAY: WEDNESDAY  Beginning at 10a.m. (5 hours before procedure):         1. Every 15 minutes, drink the solution down to the next mark (approx 8 oz) until the full liter is complete.  2. Follow completed prep with 16 oz. of clear liquid of your choice.    3. You may drink clear liquids until 1:00PM (2 HOURS BEFORE PROCEDURE).   MEDICATION INSTRUCTIONS  Unless otherwise instructed, you should take regular prescription medications with a small sip of water   as early as possible the morning of your procedure.   Stop taking Coumadin on  09/21/2009 (5 days before procedure).Pending Dr Excell Seltzer  Additional medication instructions: You will be contaced by our office prior to your procedure for directions on holding your Coumadin/Warfarin.  If you do not hear from our office 1 week prior to your scheduled procedure, please call [phone] to discuss.          OTHER INSTRUCTIONS  You will need a responsible adult at least 75 years of age to accompany you and drive you home.   This person must remain in the waiting room during your procedure.  Wear loose fitting clothing that is easily removed.  Leave jewelry and other valuables at home.  However, you may wish to bring a book to read or  an iPod/MP3 player to listen to music as you wait for your procedure to start.  Remove all body piercing jewelry and leave at home.  Total time from sign-in until discharge is approximately 2-3 hours.  You should go home directly after your procedure and rest.  You can resume normal activities the  day after your procedure.  The day of your procedure you should  not:   Drive   Make legal decisions   Operate machinery   Drink alcohol   Return to work  You will receive specific instructions about eating, activities and medications before you leave.    The above instructions have been reviewed and explained to me by   _______________________    I fully understand and can verbalize these instructions _____________________________ Date _________

## 2010-07-09 NOTE — Medication Information (Signed)
Summary: rov/cs  Anticoagulant Therapy  Managed by: Reina Fuse, PharmD Referring MD: Tonny Bollman PCP: Birdie Sons, MD Supervising MD: Shirlee Latch MD, Dalton Indication 1: Atrial Fibrillation (ICD-427.31) Indication 2: TIA (ICD-435.9) Lab Used: [facility] Site: [facility] INR POC 2.1 INR RANGE 2 - 3  Dietary changes: no    Health status changes: no    Bleeding/hemorrhagic complications: yes       Details: Nosebleed today after working outside yesterday.   Recent/future hospitalizations: no    Any changes in medication regimen? no    Recent/future dental: no  Any missed doses?: no       Is patient compliant with meds? yes      Comments: Dental extraction on 11th. Held Coumadin. Restarted Coumadin on 11/13 on regular schedule.   Allergies: No Known Drug Allergies  Anticoagulation Management History:      The patient is taking warfarin and comes in today for a routine follow up visit.  Positive risk factors for bleeding include an age of 75 years or older, history of CVA/TIA, and presence of serious comorbidities.  The bleeding index is 'high risk'.  Positive CHADS2 values include History of CHF, Age > 74 years old, and Prior Stroke/CVA/TIA.  Negative CHADS2 values include History of Diabetes.  The start date was 02/16/2002.  His last INR was 3.7.  Anticoagulation responsible provider: Shirlee Latch MD, Dalton.  INR POC: 2.1.  Cuvette Lot#: [id number].  Exp: 04/2011.    Anticoagulation Management Assessment/Plan:      The patient's current anticoagulation dose is Warfarin sodium 5 mg tabs: Use as directed by Anticoagulation Clinic.  The target INR is 2.0-3.0.  The next INR is due 05/29/2010.  Anticoagulation instructions were given to patient.  Results were reviewed/authorized by Reina Fuse, PharmD.  He was notified by Reina Fuse PharmD.         Prior Anticoagulation Instructions: INR 1.9  Increase to 1 tablet on Friday, then take Coumadin 1 tablet every day of the week, except 1/2  tablet on Monday.  Return to clinic in 2-3 weeks.    Current Anticoagulation Instructions: INR 2.1  Continue taking Coumadin 1 tab (5 mg) on all days except for Coumadin 0.5 tab (2.5 mg) on Mondays.  Return to clinic in 4 weeks.

## 2010-07-09 NOTE — Letter (Signed)
Summary: Psychiatric nurse, Main Office  [address]   [place], [place] [address]   Phone: [phone]  Fax: [phone]        April 12, 2010 MRN: [id number]    [facility] [address] [place], [place]  [address]   To Whom it May Concern:  [name] can hold warfarin for 5 days prior to dental extraction, to resume 48 hours after extraction. Please feel free to call with questions.  Sincerely,  Norva Karvonen, MD  This letter has been electronically signed by your physician.

## 2010-07-09 NOTE — Medication Information (Signed)
Summary: rov/ewj  Anticoagulant Therapy  Managed by: Bethena Midget, RN, BSN Referring MD: Tonny Bollman PCP: Birdie Sons, MD Supervising MD: Myrtis Ser MD, Tinnie Gens Indication 1: Atrial Fibrillation (ICD-427.31) Indication 2: TIA (ICD-435.9) Lab Used: [facility] Site: [facility] INR POC 2.0 INR RANGE 2 - 3  Dietary changes: no    Health status changes: no    Bleeding/hemorrhagic complications: no    Recent/future hospitalizations: no    Any changes in medication regimen? no    Recent/future dental: no  Any missed doses?: no       Is patient compliant with meds? yes       Allergies: No Known Drug Allergies  Anticoagulation Management History:      The patient is taking warfarin and comes in today for a routine follow up visit.  Positive risk factors for bleeding include an age of 39 years or older, history of CVA/TIA, and presence of serious comorbidities.  The bleeding index is 'high risk'.  Positive CHADS2 values include History of CHF, Age > 50 years old, and Prior Stroke/CVA/TIA.  Negative CHADS2 values include History of Diabetes.  The start date was 02/16/2002.  His last INR was 3.7.  Anticoagulation responsible provider: Myrtis Ser MD, Tinnie Gens.  INR POC: 2.0.  Cuvette Lot#: [id number].  Exp: 03/2011.    Anticoagulation Management Assessment/Plan:      The patient's current anticoagulation dose is Warfarin sodium 5 mg tabs: Use as directed by Anticoagulation Clinic.  The target INR is 2.0-3.0.  The next INR is due 03/07/2010.  Anticoagulation instructions were given to patient.  Results were reviewed/authorized by Bethena Midget, RN, BSN.  He was notified by Bethena Midget, RN, BSN.         Prior Anticoagulation Instructions: INR 2.0  Continue on same dosage 1 tablet daily except 1/2 tablet on Mondays and Fridays.  Recheck in 3-4 weeks.    Current Anticoagulation Instructions: INR 2.0 Today take 1 1/2 pills then continue 1 pill everyday except 1/2 pill on Mondays and Fridays.  Recheck in 4 weeks.

## 2010-07-09 NOTE — Medication Information (Signed)
Summary: rov/sp  Anticoagulant Therapy  Managed by: Eda Keys, PharmD Referring MD: Tonny Bollman PCP: Birdie Sons, MD Supervising MD: Jens Som MD, Arlys John Indication 1: Atrial Fibrillation (ICD-427.31) Indication 2: TIA (ICD-435.9) Lab Used: [facility] Site: [facility] INR POC 2.1 INR RANGE 2 - 3  Dietary changes: no    Health status changes: no    Bleeding/hemorrhagic complications: no    Recent/future hospitalizations: no    Any changes in medication regimen? no    Recent/future dental: no  Any missed doses?: no       Is patient compliant with meds? yes       Allergies: No Known Drug Allergies  Anticoagulation Management History:      The patient is taking warfarin and comes in today for a routine follow up visit.  Positive risk factors for bleeding include an age of 75 years or older, history of CVA/TIA, and presence of serious comorbidities.  The bleeding index is 'high risk'.  Positive CHADS2 values include History of CHF, Age > 16 years old, and Prior Stroke/CVA/TIA.  Negative CHADS2 values include History of Diabetes.  The start date was 02/16/2002.  His last INR was 3.7.  Anticoagulation responsible provider: Jens Som MD, Arlys John.  INR POC: 2.1.  Cuvette Lot#: [id number].  Exp: 01/2011.    Anticoagulation Management Assessment/Plan:      The patient's current anticoagulation dose is Warfarin sodium 5 mg tabs: Use as directed by Anticoagulation Clinic.  The target INR is 2.0-3.0.  The next INR is due 11/26/2009.  Anticoagulation instructions were given to patient.  Results were reviewed/authorized by Eda Keys, PharmD.  He was notified by Eda Keys.         Prior Anticoagulation Instructions: INR 2.0  Continue same dose of 1 tablet every day except 1/2 tablet on Monday, Wednesday and Friday   Current Anticoagulation Instructions: INR 2.1  Continue taking 1/2 tablet on Monday, Wednesday, and Friday, and 1 tablet all other days.  Return to clinic  in 4 weeks.

## 2010-07-09 NOTE — Medication Information (Signed)
Summary: rov/tm  Anticoagulant Therapy  Managed by: Weston Brass, PharmD Referring MD: Tonny Bollman PCP: Birdie Sons, MD Supervising MD: Riley Kill MD, Maisie Fus Indication 1: Atrial Fibrillation (ICD-427.31) Indication 2: TIA (ICD-435.9) Lab Used: [facility] Site: [facility] INR POC 2.0 INR RANGE 2 - 3  Dietary changes: no    Health status changes: no    Bleeding/hemorrhagic complications: no    Recent/future hospitalizations: no    Any changes in medication regimen? no    Recent/future dental: no  Any missed doses?: no       Is patient compliant with meds? yes       Allergies: No Known Drug Allergies  Anticoagulation Management History:      The patient is taking warfarin and comes in today for a routine follow up visit.  Positive risk factors for bleeding include an age of 75 years or older, history of CVA/TIA, and presence of serious comorbidities.  The bleeding index is 'high risk'.  Positive CHADS2 values include History of CHF, Age > 41 years old, and Prior Stroke/CVA/TIA.  Negative CHADS2 values include History of Diabetes.  The start date was 02/16/2002.  His last INR was 3.7.  Anticoagulation responsible provider: Riley Kill MD, Maisie Fus.  INR POC: 2.0.  Cuvette Lot#: [id number].  Exp: 11/2010.    Anticoagulation Management Assessment/Plan:      The patient's current anticoagulation dose is Warfarin sodium 5 mg tabs: Use as directed by Anticoagulation Clinic.  The target INR is 2.0-3.0.  The next INR is due 10/29/2009.  Anticoagulation instructions were given to patient.  Results were reviewed/authorized by Weston Brass, PharmD.  He was notified by Weston Brass PharmD.         Prior Anticoagulation Instructions: INR 1.6 Today take 5mg s then resume 5mg s daily except 2.5mg s on Mondays, Wednesdays and Fridays. Recheck in 2 weeks.  Current Anticoagulation Instructions: INR 2.0  Continue same dose of 1 tablet every day except 1/2 tablet on Monday, Wednesday and Friday

## 2010-07-09 NOTE — Medication Information (Signed)
Summary: rov/tm  Anticoagulant Therapy  Managed by: Bethena Midget, RN, BSN Referring MD: Tonny Bollman PCP: Birdie Sons, MD Supervising MD: Antoine Poche MD, Fayrene Fearing Indication 1: Atrial Fibrillation (ICD-427.31) Indication 2: TIA (ICD-435.9) Lab Used: [facility] Site: [facility] INR POC 1.7 INR RANGE 2 - 3  Dietary changes: no    Health status changes: no    Bleeding/hemorrhagic complications: yes       Details: pt had nose bleed this morning before he left home for appt, pt complains of nose bleed approximately every other days, has been using saline nasal spray for relief  Recent/future hospitalizations: no    Any changes in medication regimen? no    Recent/future dental: no  Any missed doses?: no       Is patient compliant with meds? yes       Allergies: No Known Drug Allergies  Anticoagulation Management History:      The patient is taking warfarin and comes in today for a routine follow up visit.  Positive risk factors for bleeding include an age of 75 years or older, history of CVA/TIA, and presence of serious comorbidities.  The bleeding index is 'high risk'.  Positive CHADS2 values include History of CHF, Age > 75 years old, and Prior Stroke/CVA/TIA.  Negative CHADS2 values include History of Diabetes.  The start date was 02/16/2002.  His last INR was 3.7.  Anticoagulation responsible provider: Antoine Poche MD, Fayrene Fearing.  INR POC: 1.7.  Cuvette Lot#: [id number].  Exp: 03/2011.    Anticoagulation Management Assessment/Plan:      The patient's current anticoagulation dose is Warfarin sodium 5 mg tabs: Use as directed by Anticoagulation Clinic.  The target INR is 2.0-3.0.  The next INR is due 01/07/2010.  Anticoagulation instructions were given to patient.  Results were reviewed/authorized by Bethena Midget, RN, BSN.  He was notified by Dillard Cannon.         Prior Anticoagulation Instructions: INR 2.0 Today take 1 pill then resume 1 pill everyday excpet 1/2 pill on Mondays,  Wednesdays and Fridays. Reheck in 4 weeks.   Current Anticoagulation Instructions: INR 1.7  Take 1 tab today and then change to 1 tab daily except 1/2 tab on Monday and Friday.  Re-check INR in 2 weeks.

## 2010-07-09 NOTE — Medication Information (Signed)
Summary: rov/ewj  Anticoagulant Therapy  Managed by: Cloyde Reams, RN, BSN Referring MD: Tonny Bollman PCP: Birdie Sons, MD Supervising MD: Clifton James MD, Cristal Deer Indication 1: Atrial Fibrillation (ICD-427.31) Indication 2: TIA (ICD-435.9) Lab Used: [facility] Site: [facility] INR POC 2.2 INR RANGE 2 - 3  Dietary changes: no    Health status changes: no    Bleeding/hemorrhagic complications: no    Recent/future hospitalizations: no    Any changes in medication regimen? no    Recent/future dental: no  Any missed doses?: no       Is patient compliant with meds? yes       Allergies (verified): No Known Drug Allergies  Anticoagulation Management History:      The patient is taking warfarin and comes in today for a routine follow up visit.  Positive risk factors for bleeding include an age of 75 years or older, history of CVA/TIA, and presence of serious comorbidities.  The bleeding index is 'high risk'.  Positive CHADS2 values include History of CHF, Age > 43 years old, and Prior Stroke/CVA/TIA.  Negative CHADS2 values include History of Diabetes.  The start date was 02/16/2002.  His last INR was 3.7.  Anticoagulation responsible provider: Clifton James MD, Cristal Deer.  INR POC: 2.2.  Cuvette Lot#: [id number].  Exp: 10/2010.    Anticoagulation Management Assessment/Plan:      The patient's current anticoagulation dose is Warfarin sodium 5 mg tabs: Use as directed by Anticoagulation Clinic.  The target INR is 2.0-3.0.  The next INR is due 10/01/2009.  Anticoagulation instructions were given to patient.  Results were reviewed/authorized by Cloyde Reams, RN, BSN.  He was notified by Cloyde Reams RN.         Prior Anticoagulation Instructions: INR 1.9  Take 1 tablet today, then start taking 1 tablet daily except 1/2 tablet on Monday, Wednesday and Friday.  Recheck in 4 weeks.    Current Anticoagulation Instructions: INR 2.2  Continue on same dosage 1 tablet daily except  1/2 tablet on Mondays, Wednesdays, and Fridays.  Recheck in 4 weeks.

## 2010-07-09 NOTE — Medication Information (Signed)
Summary: rov.m  Anticoagulant Therapy  Managed by: Darolyn Rua, PharmD Referring MD: Tonny Bollman PCP: Birdie Sons, MD Supervising MD: Daleen Squibb MD, Maisie Fus Indication 1: Atrial Fibrillation (ICD-427.31) Indication 2: TIA (ICD-435.9) Lab Used: [facility] Site: [facility] INR POC 3.3 INR RANGE 2 - 3  Dietary changes: no    Health status changes: no    Bleeding/hemorrhagic complications: no    Recent/future hospitalizations: no    Any changes in medication regimen? no    Recent/future dental: no  Any missed doses?: no       Is patient compliant with meds? yes       Allergies: No Known Drug Allergies  Anticoagulation Management History:      The patient is taking warfarin and comes in today for a routine follow up visit.  Positive risk factors for bleeding include an age of 75 years or older, history of CVA/TIA, and presence of serious comorbidities.  The bleeding index is 'high risk'.  Positive CHADS2 values include History of CHF, Age > 75 years old, and Prior Stroke/CVA/TIA.  Negative CHADS2 values include History of Diabetes.  The start date was 02/16/2002.  His last INR was 3.7.  Anticoagulation responsible provider: Daleen Squibb MD, Maisie Fus.  INR POC: 3.3.  Cuvette Lot#: [id number].  Exp: 09/2010.    Anticoagulation Management Assessment/Plan:      The patient's current anticoagulation dose is Warfarin sodium 5 mg tabs: Use as directed by Anticoagulation Clinic.  The target INR is 2.0-3.0.  The next INR is due 07/16/2009.  Anticoagulation instructions were given to patient.  Results were reviewed/authorized by Darolyn Rua, PharmD.  He was notified by Lew Dawes, PharmD Candidate.         Prior Anticoagulation Instructions: INR 3.4  Skip 1 day then resume 0.5 tab each Monday, Wednesday, Friday and 1 tab on all other days.  Recheck in 3 weeks.   Current Anticoagulation Instructions: INR 3.3  Skip today's dose then decrease dose to 0.5 tablet daily then 1 tablet  Sundays, Tuesdays, and Thursdays. Recheck in 3 weeks.

## 2010-07-09 NOTE — Letter (Signed)
[facility]   Imported By: Maryln Gottron 02/06/2010 15:29:15  _____________________________________________________________________  External Attachment:    Type:   Image     Comment:   External Document

## 2010-07-11 NOTE — Medication Information (Signed)
Summary: rov/sl  Anticoagulant Therapy  Managed by: Bethena Midget, RN, BSN Referring MD: Tonny Bollman PCP: Birdie Sons, MD Supervising MD: Shirlee Latch MD, Stanley Helmuth Indication 1: Atrial Fibrillation (ICD-427.31) Indication 2: TIA (ICD-435.9) Lab Used: [facility] Site: [facility] INR POC 2.7 INR RANGE 2 - 3  Dietary changes: no    Health status changes: no    Bleeding/hemorrhagic complications: no    Recent/future hospitalizations: no    Any changes in medication regimen? yes       Details: Temp. trial off Flomax  Recent/future dental: no  Any missed doses?: no       Is patient compliant with meds? yes       Allergies: No Known Drug Allergies  Anticoagulation Management History:      The patient is taking warfarin and comes in today for a routine follow up visit.  Positive risk factors for bleeding include an age of 75 years or older, history of CVA/TIA, and presence of serious comorbidities.  The bleeding index is 'high risk'.  Positive CHADS2 values include History of CHF, Age > 75 years old, and Prior Stroke/CVA/TIA.  Negative CHADS2 values include History of Diabetes.  The start date was 02/16/2002.  His last INR was 3.7.  Anticoagulation responsible provider: Shirlee Latch MD, Burnell Matlin.  INR POC: 2.7.  Cuvette Lot#: [id number].  Exp: 07/2011.    Anticoagulation Management Assessment/Plan:      The patient's current anticoagulation dose is Warfarin sodium 5 mg tabs: Use as directed by Anticoagulation Clinic.  The target INR is 2.0-3.0.  The next INR is due 06/26/2010.  Anticoagulation instructions were given to patient.  Results were reviewed/authorized by Bethena Midget, RN, BSN.  He was notified by Bethena Midget, RN, BSN.         Prior Anticoagulation Instructions: INR 2.1  Continue taking Coumadin 1 tab (5 mg) on all days except for Coumadin 0.5 tab (2.5 mg) on Mondays.  Return to clinic in 4 weeks.   Current Anticoagulation Instructions: INR 2.7 Continue 1 pill everyday except  1/2 pill on Mondays. Recheck in 4 weeks.

## 2010-07-11 NOTE — Letter (Signed)
[facility]   Imported By: Maryln Gottron 06/06/2010 13:28:20  _____________________________________________________________________  External Attachment:    Type:   Image     Comment:   External Document

## 2010-07-11 NOTE — Medication Information (Signed)
Summary: rov/tm  Anticoagulant Therapy  Managed by: Weston Brass, PharmD Referring MD: Tonny Bollman PCP: Birdie Sons, MD Supervising MD: Graciela Husbands MD, Viviann Spare Indication 1: Atrial Fibrillation (ICD-427.31) Indication 2: TIA (ICD-435.9) Lab Used: [facility] Site: [facility] INR POC 2.3 INR RANGE 2 - 3  Dietary changes: no    Health status changes: no    Bleeding/hemorrhagic complications: no    Recent/future hospitalizations: no    Any changes in medication regimen? no    Recent/future dental: no  Any missed doses?: no       Is patient compliant with meds? yes       Allergies: No Known Drug Allergies  Anticoagulation Management History:      The patient is taking warfarin and comes in today for a routine follow up visit.  Positive risk factors for bleeding include an age of 75 years or older, history of CVA/TIA, and presence of serious comorbidities.  The bleeding index is 'high risk'.  Positive CHADS2 values include History of CHF, Age > 8 years old, and Prior Stroke/CVA/TIA.  Negative CHADS2 values include History of Diabetes.  The start date was 02/16/2002.  His last INR was 3.7.  Anticoagulation responsible provider: Graciela Husbands MD, Viviann Spare.  INR POC: 2.3.  Cuvette Lot#: [id number].  Exp: 06/2011.    Anticoagulation Management Assessment/Plan:      The patient's current anticoagulation dose is Warfarin sodium 5 mg tabs: Use as directed by Anticoagulation Clinic.  The target INR is 2.0-3.0.  The next INR is due 07/24/2010.  Anticoagulation instructions were given to patient.  Results were reviewed/authorized by Weston Brass, PharmD.  He was notified by Stephannie Peters, PharmD Candidate .         Prior Anticoagulation Instructions: INR 2.7 Continue 1 pill everyday except 1/2 pill on Mondays. Recheck in 4 weeks.   Current Anticoagulation Instructions: INR 2.3  Coumadin 5 mg tablets - Continue 1 tablet every day except 1/2 tablet on Mondays

## 2010-07-15 ENCOUNTER — Ambulatory Visit (INDEPENDENT_AMBULATORY_CARE_PROVIDER_SITE_OTHER): Payer: Medicare Other | Admitting: Internal Medicine

## 2010-07-15 DIAGNOSIS — I2510 Atherosclerotic heart disease of native coronary artery without angina pectoris: Secondary | ICD-10-CM

## 2010-07-15 DIAGNOSIS — I509 Heart failure, unspecified: Secondary | ICD-10-CM

## 2010-07-15 DIAGNOSIS — E785 Hyperlipidemia, unspecified: Secondary | ICD-10-CM

## 2010-07-15 DIAGNOSIS — M069 Rheumatoid arthritis, unspecified: Secondary | ICD-10-CM

## 2010-07-15 DIAGNOSIS — C3490 Malignant neoplasm of unspecified part of unspecified bronchus or lung: Secondary | ICD-10-CM

## 2010-07-15 DIAGNOSIS — I4891 Unspecified atrial fibrillation: Secondary | ICD-10-CM

## 2010-07-15 MED ORDER — FERROUS SULFATE 325 (65 FE) MG PO TABS: 325.0000 mg | ORAL_TABLET | Freq: Every day | ORAL | Status: DC

## 2010-07-15 MED ORDER — FINASTERIDE 5 MG PO TABS: 5.0000 mg | ORAL_TABLET | Freq: Every day | ORAL | Status: DC

## 2010-07-15 NOTE — Progress Notes (Signed)
  Subjective:    Patient ID: [name], male    DOB: [birth year], 77 y.o.   MRN: [id number]  HPI  Patient is here for followup of multiple medical problems. See medical problems at rest and the assessment and plan section. These were all reviewed with the patient. He currently denies any chest pain, shortness of breath no PND. He does admit to some fatigue. He tells me he's being followed by the oncologist every other month and receives some sort of injection to maintain an appropriate blood count.  Review of Systems Patient denies any chest pain, shortness breath, PND or any other specific complaints on complete review of systems.    Objective:   Physical Exam      Well-developed, elderly male in no acute distress. HEENT exam atraumatic, normocephalic, neck supple without lymphadenopathy or jugular venous distention. Chest clear to auscultation cardiac exam S1 and S2 are regular (note history of atrial fibrillation), abdominal exam today, the bowel sounds, soft. Extremities no edema. Neurologic exam he is alert with a normal gait.  Assessment & Plan:

## 2010-07-15 NOTE — Assessment & Plan Note (Signed)
No sxs---tolerating meds, will continue the same

## 2010-07-15 NOTE — Assessment & Plan Note (Signed)
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

## 2010-07-15 NOTE — Assessment & Plan Note (Signed)
Rate controlled Continue same meds

## 2010-07-15 NOTE — Assessment & Plan Note (Signed)
Reviewed CT from January 2012---no recurrence

## 2010-07-15 NOTE — Assessment & Plan Note (Signed)
No sxs Continue same meds 

## 2010-07-15 NOTE — Assessment & Plan Note (Signed)
Previously controlled No need to check today---labs are regularly checked at [facility]

## 2010-07-24 ENCOUNTER — Encounter: Payer: Self-pay | Admitting: Internal Medicine

## 2010-07-24 ENCOUNTER — Encounter (INDEPENDENT_AMBULATORY_CARE_PROVIDER_SITE_OTHER): Payer: Medicare Other

## 2010-07-24 ENCOUNTER — Encounter (HOSPITAL_BASED_OUTPATIENT_CLINIC_OR_DEPARTMENT_OTHER): Payer: Medicare Other | Admitting: Oncology

## 2010-07-24 ENCOUNTER — Other Ambulatory Visit: Payer: Self-pay | Admitting: Oncology

## 2010-07-24 DIAGNOSIS — M069 Rheumatoid arthritis, unspecified: Secondary | ICD-10-CM

## 2010-07-24 DIAGNOSIS — C61 Malignant neoplasm of prostate: Secondary | ICD-10-CM

## 2010-07-24 DIAGNOSIS — Z7901 Long term (current) use of anticoagulants: Secondary | ICD-10-CM

## 2010-07-24 DIAGNOSIS — I4891 Unspecified atrial fibrillation: Secondary | ICD-10-CM

## 2010-07-24 DIAGNOSIS — D638 Anemia in other chronic diseases classified elsewhere: Secondary | ICD-10-CM

## 2010-07-24 LAB — CBC WITH DIFFERENTIAL/PLATELET
BASO%: 1 % (ref 0.0–2.0)
Eosinophils Absolute: 0.3 10*3/uL (ref 0.0–0.5)
HCT: 32.2 % — ABNORMAL LOW (ref 38.4–49.9)
MCHC: 32.4 g/dL (ref 32.0–36.0)
MONO#: 0.6 10*3/uL (ref 0.1–0.9)
NEUT#: 2.7 10*3/uL (ref 1.5–6.5)
Platelets: 304 10*3/uL (ref 140–400)
RBC: 3.55 10*6/uL — ABNORMAL LOW (ref 4.20–5.82)
WBC: 5.4 10*3/uL (ref 4.0–10.3)
lymph#: 1.8 10*3/uL (ref 0.9–3.3)

## 2010-07-24 LAB — CONVERTED CEMR LAB: POC INR: 2.5

## 2010-07-28 ENCOUNTER — Other Ambulatory Visit: Payer: Self-pay | Admitting: Internal Medicine

## 2010-07-31 NOTE — Medication Information (Signed)
Summary: [facility]  Anticoagulant Therapy  Managed by: Leota Sauers, PharmD, BCPS, CPP Referring MD: Tonny Bollman PCP: Birdie Sons, MD Supervising MD: Gala Romney MD, Stephana Morell Indication 1: Atrial Fibrillation (ICD-427.31) Indication 2: TIA (ICD-435.9) Lab Used: [facility] Site: [facility] INR POC 2.5 INR RANGE 2 - 3  Dietary changes: no    Health status changes: no    Bleeding/hemorrhagic complications: no    Recent/future hospitalizations: no    Any changes in medication regimen? no    Recent/future dental: no  Any missed doses?: no       Is patient compliant with meds? yes       Current Medications (verified): 1)  Cartia Xt 120 Mg Cp24 (Diltiazem Hcl Coated Beads) .... Once Daily 2)  Lisinopril 20 Mg  Tabs (Lisinopril) .... One By Mouth Daily 3)  Rapaflo 8 Mg Caps (Silodosin) .... Take 1 Tab By Mouth At Bedtime 4)  Alendronate Sodium 70 Mg  Tabs (Alendronate Sodium) .Marland Kitchen.. 1 By Mouth Q Week . Take With 8 Ounces of Water On and Empty Stomach 5)  Folic Acid 400 Mcg  Tabs (Folic Acid) .... One By Mouth Daily 6)  Calcium 600/vitamin D 600-200 Mg-Unit  Tabs (Calcium Carbonate-Vitamin D) .... Once Daily 7)  Simvastatin 40 Mg Tabs (Simvastatin) .... Take One Tablet At Bedtime 8)  Finasteride 5 Mg Tabs (Finasteride) .... Take 1 Tablet By Mouth Once A Day 9)  Aspirin 81 Mg Tbec (Aspirin) .... Take 1 Tablet By Mouth Once A Day 10)  Ferrous Sulfate 325 (65 Fe) Mg Tabs (Ferrous Sulfate) .... Take 1 Tablet By Mouth Two Times A Day 11)  Warfarin Sodium 5 Mg Tabs (Warfarin Sodium) .... Use As Directed By Anticoagulation Clinic 12)  Toprol Xl 100 Mg Xr24h-Tab (Metoprolol Succinate) .... Take 1 Tablet By Mouth Once A Day 13)  Tylenol 325 Mg Tabs (Acetaminophen) .... As Needed  Allergies (verified): No Known Drug Allergies  Anticoagulation Management History:      The patient is taking warfarin and comes in today for a routine follow up visit.  Positive risk factors for  bleeding include an age of 75 years or older, history of CVA/TIA, and presence of serious comorbidities.  The bleeding index is 'high risk'.  Positive CHADS2 values include History of CHF, Age > 90 years old, and Prior Stroke/CVA/TIA.  Negative CHADS2 values include History of Diabetes.  The start date was 02/16/2002.  His last INR was 3.7.  Anticoagulation responsible provider: Melane Windholz MD, Reuel Boom.  INR POC: 2.5.  Cuvette Lot#: [id number].  Exp: 06/2011.    Anticoagulation Management Assessment/Plan:      The patient's current anticoagulation dose is Warfarin sodium 5 mg tabs: Use as directed by Anticoagulation Clinic.  The target INR is 2.0-3.0.  The next INR is due 08/21/2010.  Anticoagulation instructions were given to patient.  Results were reviewed/authorized by Leota Sauers, PharmD, BCPS, CPP.         Prior Anticoagulation Instructions: INR 2.3  Coumadin 5 mg tablets - Continue 1 tablet every day except 1/2 tablet on Mondays   Current Anticoagulation Instructions: INR 2.5  Coumadin 5mg  tabs Take 1 tab each day EXCEPT 1/2 tab on MON

## 2010-08-13 ENCOUNTER — Encounter: Payer: Self-pay | Admitting: Cardiovascular Disease

## 2010-08-13 DIAGNOSIS — Z8679 Personal history of other diseases of the circulatory system: Secondary | ICD-10-CM

## 2010-08-13 DIAGNOSIS — I4891 Unspecified atrial fibrillation: Secondary | ICD-10-CM

## 2010-08-21 ENCOUNTER — Other Ambulatory Visit: Payer: Self-pay | Admitting: Oncology

## 2010-08-21 ENCOUNTER — Encounter: Payer: Self-pay | Admitting: Cardiology

## 2010-08-21 ENCOUNTER — Encounter (INDEPENDENT_AMBULATORY_CARE_PROVIDER_SITE_OTHER): Payer: Medicare Other

## 2010-08-21 ENCOUNTER — Encounter (HOSPITAL_BASED_OUTPATIENT_CLINIC_OR_DEPARTMENT_OTHER): Payer: Medicare Other | Admitting: Oncology

## 2010-08-21 DIAGNOSIS — D638 Anemia in other chronic diseases classified elsewhere: Secondary | ICD-10-CM

## 2010-08-21 DIAGNOSIS — Z7901 Long term (current) use of anticoagulants: Secondary | ICD-10-CM

## 2010-08-21 DIAGNOSIS — G459 Transient cerebral ischemic attack, unspecified: Secondary | ICD-10-CM

## 2010-08-21 DIAGNOSIS — I4891 Unspecified atrial fibrillation: Secondary | ICD-10-CM

## 2010-08-21 DIAGNOSIS — C61 Malignant neoplasm of prostate: Secondary | ICD-10-CM

## 2010-08-21 DIAGNOSIS — M069 Rheumatoid arthritis, unspecified: Secondary | ICD-10-CM

## 2010-08-21 DIAGNOSIS — C3430 Malignant neoplasm of lower lobe, unspecified bronchus or lung: Secondary | ICD-10-CM

## 2010-08-21 LAB — CBC WITH DIFFERENTIAL/PLATELET
Basophils Absolute: 0.2 10*3/uL — ABNORMAL HIGH (ref 0.0–0.1)
Eosinophils Absolute: 0.3 10*3/uL (ref 0.0–0.5)
HGB: 12.3 g/dL — ABNORMAL LOW (ref 13.0–17.1)
MCV: 90.4 fL (ref 79.3–98.0)
MONO#: 0.6 10*3/uL (ref 0.1–0.9)
NEUT#: 1.4 10*3/uL — ABNORMAL LOW (ref 1.5–6.5)
RDW: 14.8 % — ABNORMAL HIGH (ref 11.0–14.6)
WBC: 4.4 10*3/uL (ref 4.0–10.3)
lymph#: 1.9 10*3/uL (ref 0.9–3.3)

## 2010-08-21 LAB — CONVERTED CEMR LAB: POC INR: 2.5

## 2010-08-27 NOTE — Medication Information (Signed)
Summary: [facility]  Anticoagulant Therapy  Managed by: Windell Hummingbird, RN Referring MD: Tonny Bollman PCP: Birdie Sons, MD Supervising MD: Riley Kill MD, Maisie Fus Indication 1: Atrial Fibrillation (ICD-427.31) Indication 2: TIA (ICD-435.9) Lab Used: [facility] Site: [facility] INR POC 2.5 INR RANGE 2 - 3  Dietary changes: no    Health status changes: no    Bleeding/hemorrhagic complications: no    Recent/future hospitalizations: no    Any changes in medication regimen? no    Recent/future dental: no  Any missed doses?: no       Is patient compliant with meds? yes       Allergies: No Known Drug Allergies  Anticoagulation Management History:      The patient is taking warfarin and comes in today for a routine follow up visit.  Positive risk factors for bleeding include an age of 75 years or older, history of CVA/TIA, and presence of serious comorbidities.  The bleeding index is 'high risk'.  Positive CHADS2 values include History of CHF, Age > 87 years old, and Prior Stroke/CVA/TIA.  Negative CHADS2 values include History of Diabetes.  The start date was 02/16/2002.  His last INR was 3.7.  Anticoagulation responsible provider: Riley Kill MD, Maisie Fus.  INR POC: 2.5.  Cuvette Lot#: [id number].  Exp: 07/2011.    Anticoagulation Management Assessment/Plan:      The patient's current anticoagulation dose is Warfarin sodium 5 mg tabs: Use as directed by Anticoagulation Clinic.  The target INR is 2.0-3.0.  The next INR is due 09/18/2010.  Anticoagulation instructions were given to patient.  Results were reviewed/authorized by Windell Hummingbird, RN.  He was notified by Windell Hummingbird, RN.         Prior Anticoagulation Instructions: INR 2.5  Coumadin 5mg  tabs Take 1 tab each day EXCEPT 1/2 tab on MON  Current Anticoagulation Instructions: INR 2.5 Continue taking 1 tablet every day, except take 1/2 tablet on Mondays. Recheck in 4 weeks.

## 2010-08-28 ENCOUNTER — Other Ambulatory Visit: Payer: Self-pay | Admitting: Internal Medicine

## 2010-09-10 LAB — CREATININE, SERUM
Creatinine, Ser: 1.31 mg/dL (ref 0.4–1.5)
GFR calc Af Amer: >60 mL/min (ref >60)
GFR calc non Af Amer: 53 mL/min — ABNORMAL LOW (ref >60)

## 2010-09-10 LAB — BUN: BUN: 15 mg/dL (ref 6–23)

## 2010-09-18 ENCOUNTER — Ambulatory Visit (INDEPENDENT_AMBULATORY_CARE_PROVIDER_SITE_OTHER): Payer: Medicare Other | Admitting: *Deleted

## 2010-09-18 ENCOUNTER — Other Ambulatory Visit: Payer: Self-pay | Admitting: Oncology

## 2010-09-18 ENCOUNTER — Encounter (HOSPITAL_BASED_OUTPATIENT_CLINIC_OR_DEPARTMENT_OTHER): Payer: Medicare Other | Admitting: Oncology

## 2010-09-18 DIAGNOSIS — I4891 Unspecified atrial fibrillation: Secondary | ICD-10-CM

## 2010-09-18 DIAGNOSIS — C61 Malignant neoplasm of prostate: Secondary | ICD-10-CM

## 2010-09-18 DIAGNOSIS — Z8679 Personal history of other diseases of the circulatory system: Secondary | ICD-10-CM

## 2010-09-18 DIAGNOSIS — Z7901 Long term (current) use of anticoagulants: Secondary | ICD-10-CM

## 2010-09-18 LAB — CBC WITH DIFFERENTIAL/PLATELET
BASO%: 1.6 % (ref 0.0–2.0)
EOS%: 6.8 % (ref 0.0–7.0)
MCH: 28.9 pg (ref 27.2–33.4)
MCHC: 32.7 g/dL (ref 32.0–36.0)
MCV: 88.4 fL (ref 79.3–98.0)
MONO%: 14 % (ref 0.0–14.0)
RDW: 14.1 % (ref 11.0–14.6)
lymph#: 2 10*3/uL (ref 0.9–3.3)

## 2010-09-18 NOTE — Patient Instructions (Addendum)
INR 3.0  Eat green vegies today Coumadin 5mg  tabs Take 1 tab each day EXCEPT 1/2 tab on MON Return in 4 weeks WED May 9 at 12 noon

## 2010-09-20 ENCOUNTER — Encounter (INDEPENDENT_AMBULATORY_CARE_PROVIDER_SITE_OTHER): Payer: Medicare Other

## 2010-09-20 DIAGNOSIS — Z48812 Encounter for surgical aftercare following surgery on the circulatory system: Secondary | ICD-10-CM

## 2010-09-20 DIAGNOSIS — I70229 Atherosclerosis of native arteries of extremities with rest pain, unspecified extremity: Secondary | ICD-10-CM

## 2010-09-23 LAB — COMPREHENSIVE METABOLIC PANEL
ALT: 13 U/L (ref 0–53)
AST: 20 U/L (ref 0–37)
Albumin: 3.8 g/dL (ref 3.5–5.2)
Alkaline Phosphatase: 63 U/L (ref 39–117)
BUN: 22 mg/dL (ref 6–23)
CO2: 26 mEq/L (ref 19–32)
Calcium: 9.8 mg/dL (ref 8.4–10.5)
Chloride: 105 mEq/L (ref 96–112)
Creatinine, Ser: 1.31 mg/dL (ref 0.4–1.5)
GFR calc Af Amer: >60 mL/min (ref >60)
GFR calc non Af Amer: 53 mL/min — ABNORMAL LOW (ref >60)
Glucose, Bld: 104 mg/dL — ABNORMAL HIGH (ref 70–99)
Potassium: 5.3 mEq/L — ABNORMAL HIGH (ref 3.5–5.1)
Sodium: 139 mEq/L (ref 135–145)
Total Bilirubin: 1 mg/dL (ref 0.3–1.2)
Total Protein: 6.7 g/dL (ref 6.0–8.3)

## 2010-09-23 LAB — PROTIME-INR
INR: 1.5 (ref 0.00–1.49)
INR: 2.2 — ABNORMAL HIGH (ref 0.00–1.49)
Prothrombin Time: 18.5 seconds — ABNORMAL HIGH (ref 11.6–15.2)
Prothrombin Time: 19.2 seconds — ABNORMAL HIGH (ref 11.6–15.2)
Prothrombin Time: 20.8 seconds — ABNORMAL HIGH (ref 11.6–15.2)
Prothrombin Time: 25.4 seconds — ABNORMAL HIGH (ref 11.6–15.2)
Prothrombin Time: 19.6 seconds — ABNORMAL HIGH (ref 11.6–15.2)
Prothrombin Time: 22.1 seconds — ABNORMAL HIGH (ref 11.6–15.2)

## 2010-09-23 LAB — CBC
HCT: 33.1 % — ABNORMAL LOW (ref 39.0–52.0)
HCT: 23.1 % — ABNORMAL LOW (ref 39.0–52.0)
Hemoglobin: 10.1 g/dL — ABNORMAL LOW (ref 13.0–17.0)
Hemoglobin: 10.5 g/dL — ABNORMAL LOW (ref 13.0–17.0)
Hemoglobin: 7.3 g/dL — CL (ref 13.0–17.0)
MCHC: 31.8 g/dL (ref 30.0–36.0)
MCHC: 32.6 g/dL (ref 30.0–36.0)
MCHC: 32.9 g/dL (ref 30.0–36.0)
MCHC: 32.3 g/dL (ref 30.0–36.0)
MCHC: 32.5 g/dL (ref 30.0–36.0)
MCHC: 32.5 g/dL (ref 30.0–36.0)
MCV: 90.5 fL (ref 78.0–100.0)
MCV: 91.2 fL (ref 78.0–100.0)
MCV: 89.9 fL (ref 78.0–100.0)
MCV: 91 fL (ref 78.0–100.0)
Platelets: 185 10*3/uL (ref 150–400)
Platelets: 282 10*3/uL (ref 150–400)
Platelets: 177 10*3/uL (ref 150–400)
Platelets: 200 10*3/uL (ref 150–400)
Platelets: 246 10*3/uL (ref 150–400)
RBC: 3.42 MIL/uL — ABNORMAL LOW (ref 4.22–5.81)
RBC: 3.63 MIL/uL — ABNORMAL LOW (ref 4.22–5.81)
RDW: 14.2 % (ref 11.5–15.5)
RDW: 14.6 % (ref 11.5–15.5)
RDW: 14.2 % (ref 11.5–15.5)
WBC: 9.3 10*3/uL (ref 4.0–10.5)
WBC: 9.8 10*3/uL (ref 4.0–10.5)
WBC: 6.4 10*3/uL (ref 4.0–10.5)

## 2010-09-23 LAB — URINALYSIS, ROUTINE W REFLEX MICROSCOPIC
Bilirubin Urine: NEGATIVE
Glucose, UA: NEGATIVE mg/dL
Hgb urine dipstick: NEGATIVE
Ketones, ur: NEGATIVE mg/dL
Nitrite: NEGATIVE
Protein, ur: NEGATIVE mg/dL
Specific Gravity, Urine: 1.019 (ref 1.005–1.030)
Urobilinogen, UA: 1 mg/dL (ref 0.0–1.0)
pH: 5.5 (ref 5.0–8.0)

## 2010-09-23 LAB — BASIC METABOLIC PANEL
BUN: 15 mg/dL (ref 6–23)
CO2: 27 mEq/L (ref 19–32)
CO2: 26 mEq/L (ref 19–32)
Calcium: 8.9 mg/dL (ref 8.4–10.5)
Calcium: 8.3 mg/dL — ABNORMAL LOW (ref 8.4–10.5)
Creatinine, Ser: 1.15 mg/dL (ref 0.4–1.5)
Creatinine, Ser: 1.17 mg/dL (ref 0.4–1.5)
GFR calc Af Amer: >60 mL/min (ref >60)
GFR calc non Af Amer: >60 mL/min (ref >60)
Glucose, Bld: 90 mg/dL (ref 70–99)
Sodium: 140 mEq/L (ref 135–145)

## 2010-09-23 LAB — TYPE AND SCREEN
ABO/RH(D): A POS
Antibody Screen: NEGATIVE

## 2010-09-23 LAB — CROSSMATCH

## 2010-09-23 LAB — APTT: aPTT: 34 seconds (ref 24–37)

## 2010-09-23 LAB — HEPARIN LEVEL (UNFRACTIONATED)
Heparin Unfractionated: 0.42 IU/mL (ref 0.30–0.70)
Heparin Unfractionated: <0.1 IU/mL — ABNORMAL LOW (ref 0.30–0.70)
Heparin Unfractionated: 0.31 IU/mL (ref 0.30–0.70)
Heparin Unfractionated: 0.45 IU/mL (ref 0.30–0.70)

## 2010-10-02 NOTE — Procedures (Unsigned)
BYPASS GRAFT EVALUATION  INDICATION:  Right lower extremity bypass graft.  HISTORY: Diabetes:  No. Cardiac:  No. Hypertension:  Yes. Smoking:  Previous. Previous Surgery:  Right fem-pop bypass graft on 06/21/2008.  SINGLE LEVEL ARTERIAL EXAM                              RIGHT              LEFT Brachial:                    125                124 Anterior tibial:             129                119 Posterior tibial:            101                131 Peroneal: Ankle/brachial index:        1.03 (TBI = 0.27)  1.05 (TBI = 0.13)  PREVIOUS ABI:  Date:  03/07/2010  RIGHT:  1.01  LEFT:  0.89  LOWER EXTREMITY BYPASS GRAFT DUPLEX EXAM:  DUPLEX:  Biphasic Doppler waveforms noted throughout the right lower extremity bypass graft with no increase in velocities.  IMPRESSION: 1. Patent right fem-pop bypass graft with no evidence of stenosis. 2. The bilateral ankle brachial indices and Doppler waveforms suggest     that the ankle pressures may be unreliable due to calcific vessels.     Severely decreased bilateral great toe toe brachial indices are     noted.  ___________________________________________ Di Kindle. [name], M.D.  CH/MEDQ  D:  09/20/2010  T:  09/20/2010  Job:  [id number]

## 2010-10-11 ENCOUNTER — Other Ambulatory Visit: Payer: Self-pay | Admitting: Cardiovascular Disease

## 2010-10-16 ENCOUNTER — Encounter (HOSPITAL_BASED_OUTPATIENT_CLINIC_OR_DEPARTMENT_OTHER): Payer: Medicare Other | Admitting: Oncology

## 2010-10-16 ENCOUNTER — Ambulatory Visit (INDEPENDENT_AMBULATORY_CARE_PROVIDER_SITE_OTHER): Payer: Medicare Other | Admitting: *Deleted

## 2010-10-16 ENCOUNTER — Other Ambulatory Visit: Payer: Self-pay | Admitting: Medical

## 2010-10-16 ENCOUNTER — Other Ambulatory Visit: Payer: Self-pay | Admitting: Oncology

## 2010-10-16 DIAGNOSIS — C3490 Malignant neoplasm of unspecified part of unspecified bronchus or lung: Secondary | ICD-10-CM

## 2010-10-16 DIAGNOSIS — Z8679 Personal history of other diseases of the circulatory system: Secondary | ICD-10-CM

## 2010-10-16 DIAGNOSIS — I4891 Unspecified atrial fibrillation: Secondary | ICD-10-CM

## 2010-10-16 DIAGNOSIS — D638 Anemia in other chronic diseases classified elsewhere: Secondary | ICD-10-CM

## 2010-10-16 DIAGNOSIS — M069 Rheumatoid arthritis, unspecified: Secondary | ICD-10-CM

## 2010-10-16 DIAGNOSIS — C3430 Malignant neoplasm of lower lobe, unspecified bronchus or lung: Secondary | ICD-10-CM

## 2010-10-16 LAB — CBC WITH DIFFERENTIAL/PLATELET
Basophils Absolute: 0.1 10*3/uL (ref 0.0–0.1)
HCT: 29.1 % — ABNORMAL LOW (ref 38.4–49.9)
HGB: 9.4 g/dL — ABNORMAL LOW (ref 13.0–17.1)
MCH: 28.7 pg (ref 27.2–33.4)
MONO#: 0.6 10*3/uL (ref 0.1–0.9)
NEUT%: 49.6 % (ref 39.0–75.0)
WBC: 5.6 10*3/uL (ref 4.0–10.3)
lymph#: 1.8 10*3/uL (ref 0.9–3.3)

## 2010-10-16 LAB — POCT INR: INR: 2.8

## 2010-10-22 NOTE — H&P (Signed)
HISTORY AND PHYSICAL EXAMINATION   June 06, 2008   Re:  SEHAJ, MCENROE                DOB:  31-Aug-1931   REASON FOR ADMISSION:  Nonhealing wound of the right great toe with  peripheral vascular disease.   HISTORY:  This is a pleasant 77 year old gentleman who I had originally  seen in July of 2009 with pain in his right great toe.  He developed a  small amount of drainage from under the toenail bed and rest pain in the  right foot.  He ultimately underwent an arteriogram which showed that he  had a superficial femoral artery occlusion on the right with single  vessel runoff via the peroneal artery.  Of note, he had had a previous  right femoral embolectomy with the right common femoral artery  endarterectomy and profundoplasty with Dacron patch angioplasty in  September of 2003.  The patient was actually scheduled for surgery in  October but his preoperative x-ray was abnormal and ultimately he was  found to have a lung malignancy.  He underwent a right VATS and mini  thoracotomy with right lower lobe lobectomy.  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  For this  reason his bypass on the right leg was deferred and I have been  following his wound which has been reasonably stable.  I saw him in the  office on 06/06/2008 and he was continuing to have pain in the toe and  the toe was gradually worsening therefore he was scheduled for elective  right fem-pop bypass graft.   PAST MEDICAL HISTORY:  Is significant for coronary artery disease.  He  underwent coronary artery bypass surgery with vein taken from the left  leg in August of 2003.  In addition, he has a history of arthritis and  is on prednisone for this.  He also has a history of atrial flutter  status post radiofrequency ablation.  He is on Coumadin for this.  He  denies any history of diabetes, hypertension, hypercholesterolemia,  history of congestive heart  failure or history of COPD.   FAMILY HISTORY:  There is no history of premature cardiovascular  disease.   SOCIAL HISTORY:  He is married.  He quit smoking 7 years ago.   ALLERGIES:  No known drug allergies.   MEDICATIONS:  1. Diltiazem 120 mg p.o. daily.  2. Coumadin 5 mg p.o. daily.  He was instructed to discontinue this on      06/16/2008 after his last dose that morning.  3. Prednisone.  4. Toprol XL 100 mg p.o. daily.  5. Finasteride 5 mg p.o. daily.  6. Actonel 35 mg p.o. weekly.  7. Calcium 600 mg with D daily.  8. Folic acid 400 mg p.o. daily.  9. Iron 325 mg p.o. daily.  10.Aspirin 81 mg p.o. daily.  11.Statin 40 mg q.h.s.  12.Flomax 0.4 mg p.o. daily.  13.Oxycodone 5 mg 1-2 every 4-6 hours p.r.n.   REVIEW OF SYSTEMS:  GENERAL:  He has had no recent weight loss, weight  gain or problems with his appetite.  CARDIAC:  He has had no chest pain, chest pressure, palpitations or  arrhythmias.  He denies any significant dyspnea on exertion.  PULMONARY:  He has had the recent right VATS as described and has done  well from this standpoint.  He has had no recent productive cough,  bronchitis, asthma or wheezing.  GI:  He has had no recent change in his bowel habits and has had no  history of peptic ulcer disease.  GU:  He has had no dysuria or frequency.  VASCULAR:  He does have some rest pain in the right foot with nonhealing  wound of the right great toe.  He has stable claudication on the right.  NEURO:  He has had no dizziness, blackouts, headaches or seizures.  ORTHO:  He does have a history of arthritis and joint pain.  HEMATOLOGY:  He has had no bleeding problems or clotting disorders.  ENT:  He has had no recent changes in eyesight or hearing.   PHYSICAL EXAMINATION:  General:  This is a pleasant 77 year old  gentleman who appears his stated age.  Vital signs:  Blood pressure  106/48, heart rate is 76.  HEENT:  Unremarkable.  Neck:  Supple.  There  is no cervical  lymphadenopathy.  I do not detect any carotid bruits.  Lungs:  Are clear bilaterally to auscultation.  Cardiac:  He has a  regular rate and rhythm.  Abdomen:  Soft and nontender.  Palpable  femoral pulses.  I cannot palpate popliteal or pedal pulses on either  side.  He has monophasic Doppler signals in both feet.  He has an open  wound on the right great toenail bed with some discoloration of darkened  skin around the toe on the right.  He has no open wounds on the left  side.   I have explained that based on his arteriogram which shows an occluded  superficial femoral artery and single vessel runoff via the peroneal  artery I think the toe would will gradually progress without  revascularization and he will require primary amputation either below  the knee or above the knee amputation.  I feel his best chance for limb  salvage is attempted revascularization with bypass into the below knee  popliteal artery hopefully using vein if his vein is adequate.  Otherwise he would require prosthetic bypass.  I have explained that the  chance of the graft staying patent is approximately 60% at 5 years.  I  have explained that even with revascularization he is at risk for  nonhealing the toe wound or a toe amputation given his tibial occlusive  disease.  We have also discussed the potential complications of wound  healing problems, infection and bleeding.  All of his questions were  answered and he is agreeable to proceed.  His surgery has been scheduled  for 01/13.   Di Kindle. [name], M.D.  Electronically Signed   CSD/MEDQ  D:  06/06/2008  T:  06/07/2008  Job:  [id number]   cc:   Ines Bloomer, M.D.

## 2010-10-22 NOTE — Procedures (Signed)
BYPASS GRAFT EVALUATION   INDICATION:  Right lower extremity bypass graft.   HISTORY:  Diabetes:  No  Cardiac:  No  Hypertension:  Yes  Smoking:  Previous  Previous Surgery:  Right femoral thrombectomy, right common femoral  artery endarterectomy and profundoplasty in September of 2003, right  fem/pop bypass graft on June 21, 2008.   SINGLE LEVEL ARTERIAL EXAM                               RIGHT              LEFT  Brachial:                    126                119  Anterior tibial:             Strong monophasic  Strong monophasic  Posterior tibial:            Strong monophasic  Strong monophasic  Peroneal:  Ankle/brachial index:        0.28 (TBI)         0.14 (TBI)   PREVIOUS ABI:  Date: September 12, 2008  RIGHT:  0.16 (TBI)  LEFT:  0.1 (TBI)   LOWER EXTREMITY BYPASS GRAFT DUPLEX EXAM:   DUPLEX:  Biphasic Doppler waveforms noted throughout the right lower  extremity bypass graft and its native vessels with no focal increase in  velocities noted.   IMPRESSION:  Patent right fem/pop bypass graft with no evidence of  stenosis.   Stable bilateral toe brachial indices noted.   Unable to obtain bilateral ankle brachial indices due to known tibial  artery calcification.   ___________________________________________  Di Kindle. [name], M.D.   CH/MEDQ  D:  01/02/2009  T:  01/03/2009  Job:  [id number]

## 2010-10-22 NOTE — Procedures (Signed)
BYPASS GRAFT EVALUATION   INDICATION:  Right lower extremity bypass graft.   HISTORY:  Diabetes:  No  Cardiac:  No  Hypertension:  Yes  Smoking:  Previous  Previous Surgery:  Right femoral thrombectomy, right common femoral  artery endarterectomy and profundaplasty in September 2003, right  femoral endarterectomy on February 10, 2002, right fem-pop bypass graft  on June 21, 2008.   Single-level arterial exam.   SINGLE LEVEL ARTERIAL EXAM                               RIGHT              LEFT  Brachial:                    126                119  Anterior tibial:             monophasic         monophasic  Posterior tibial:            monophasic         monophasic  Peroneal:  Ankle/brachial index:        calcific vessels   calcific vessels   PREVIOUS ABI:  Date: 07/11/2008  RIGHT:  calcific vessels  LEFT:  calcific  vessels   LOWER EXTREMITY BYPASS GRAFT DUPLEX EXAM:   DUPLEX:  Monophasic to biphasic Doppler waveforms noted throughout the  right lower extremity bypass graft and its native vessels with no  increased velocities noted.   IMPRESSION:  Patent right femoral-popliteal bypass graft with no  evidence of stenosis.   Stable bilateral tibial artery waveforms and toe/brachial indices noted.  The right toe/brachial index is 0.16 and the left toe/brachial index is  0.1.   ___________________________________________  Di Kindle. [name], M.D.   CH/MEDQ  D:  09/12/2008  T:  09/12/2008  Job:  [id number]

## 2010-10-22 NOTE — Assessment & Plan Note (Signed)
OFFICE VISIT   [name], [name]  DOB:  1931/09/22                                       01/02/2009  EAVWU#:[id number]   I saw the patient in the office today for continued follow-up of his  right great toe wound and to follow his fem-pop bypass graft.  He  underwent a right femoral to below knee pop bypass with vein graft in  January of this year.  He had presented with a nonhealing wound on the  right toenail bed.  He comes in for 75-month follow-up visit.  Since I  saw him last he has had no claudication or rest pain.  The  wound on the  toe has healed.  He had no fever or chills.   REVIEW OF SYSTEMS:  He has had no recent chest pain, chest pressure,  palpitations or arrhythmias.  He has had no productive cough bronchitis,  asthma or wheezing.  He also has a history of stage IB  lung cancer and  he is followed by Dr. Clelia Croft.   PHYSICAL EXAMINATION:  This is a pleasant 77 year old gentleman who  appears his stated age.  His blood pressure is 110/69, heart rate is 79.  Lungs:  Clear bilaterally to auscultation.  Cardiac exam:  He has a  regular rate and rhythm.  His abdomen is soft and nontender.  He has  palpable femoral and popliteal pulse on the right with warm well-  perfused foot and his toe is completely healed.   Doppler study in our office today shows that his bypass graft is patent  with biphasic flow throughout.  He has calcific vessels and ABIs could  not be obtained.   Overall I am pleased with his progress and will follow his graft on our  protocol.  I plan on seeing him back in 1 year.  He knows to call sooner  if he has problems.   Di Kindle. [name], M.D.  Electronically Signed   CSD/MEDQ  D:  01/02/2009  T:  01/03/2009  Job:  [id number]

## 2010-10-22 NOTE — Assessment & Plan Note (Signed)
OFFICE VISIT   [name], [name]  DOB:  07-07-1931                                       08/22/2008  ZOXWR#:[id number]   After his right fem-pop bypass graft I have been following his right  great toe.  He has no specific complaints and has overall been doing  quite well.   On examination, his incisions have all healed, he has a palpable femoral  and popliteal pulse.  The foot is warm and well-perfused.  He has  moderate swelling which is slightly better.  The toe actually looks  better, it is not as swollen and there is no significant drainage.  There is still a small open ulcer at the toenail bed.   He will continue to soak the foot daily and continue with leg elevation.  He is keeping Hydrogel on the toenail bed with a Band-Aid.  I plan on  seeing him back in 3 months.  He knows to call sooner if he has  problems.   Di Kindle. [name], M.D.  Electronically Signed   CSD/MEDQ  D:  08/22/2008  T:  08/24/2008  Job:  [id number]

## 2010-10-22 NOTE — Procedures (Signed)
BYPASS GRAFT EVALUATION   INDICATION:  Followup right femoral-popliteal artery bypass graft.   HISTORY:  Diabetes:  No.  Cardiac:  No.  Hypertension:  Yes.  Smoking:  Previous.  Previous Surgery:  Right femoral-popliteal artery bypass graft  06/21/2008 by Dr. [name].   SINGLE LEVEL ARTERIAL EXAM                               RIGHT              LEFT  Brachial:                    102                99  Anterior tibial:             131 (monophasic)   120 (monophasic)  Posterior tibial:            Noncompressible (monophasic)           63  (monophasic)  Peroneal:  Ankle/brachial index:        1.28/noncompressible                   1.18   PREVIOUS ABI:  Date: 03/22/2009  RIGHT:  0.54  LEFT:  1.01   LOWER EXTREMITY BYPASS GRAFT DUPLEX EXAM:   DUPLEX:  Doppler arterial waveforms appears biphasic proximal to, within  and distal to right lower extremity bypass graft.   IMPRESSION:  1. Patent right femoral-popliteal artery bypass graft.  2. Ankle brachial indices appear invalid with known calcified vessels.      A combination of noncompressible vessels and pressures not      correlating with waveforms.  Bilateral PT and DP waveforms appear      monophasic.  3. Bilateral toe brachial indices appear abnormal and stable from      previous study.  4. No significant changes from previous studies.   ___________________________________________  Di Kindle. [name], M.D.   AS/MEDQ  D:  07/06/2009  T:  07/06/2009  Job:  [id number]

## 2010-10-22 NOTE — Assessment & Plan Note (Signed)
OFFICE VISIT   [name], [name]  DOB:  [birth year]                                       03/16/2007  ZOXWR#:[id number]   I saw the patient in the office today for continued followup of his  peripheral vascular disease.  I last saw him a year ago.  At that time,  he had an ABI of 32% in the right and 49% in the left.  At that point,  his symptoms were quite tolerable and we decided not to proceed with  arteriography unless his symptoms progressed significantly.  He came in  for a routine followup study today.  His main complaint is some  paresthesias in both feet, more significant on the right side.  His feet  are also cold at times.  He also states that his ankles are sore.  He  does have mild calf claudication bilaterally, which is stable.  He gives  no history of rest pain and no history of non-healing ulcers.   PAST MEDICAL HISTORY:  Significant for a previous right femoral  embolectomy for an acute arterial occlusion on the right.  This is back  in 2003.  He denies any history of diabetes.  He does have a history of  hypertension, hypercholesterolemia, and a history of a myocardial  infarction in 2003.   REVIEW OF SYSTEMS:  He has had no recent chest pain, chest pressure,  palpitations, or arrhythmias.  He has had no bronchitis, asthma, or wheezing.   PHYSICAL EXAMINATION:  Blood pressure is 93/52, heart rate is 67.  I do  not detect any carotid bruits.  Lungs clear bilaterally to auscultation.  On cardiac exam, he has a regular rate and rhythm.  He has palpable  femoral pulses.  I cannot palpate popliteal or pedal pulses.  However,  both feet are warm and well perfused.  He has no ischemic ulcers on his  feet.   Doppler study in our office today shows monophasic Doppler signals  bilaterally, which are fairly brisk.  His vessels are not compressible  and ABI could not be obtained.  I have reassured him that I think he has  adequate circulation, and  that there has been no significant change in  his vascular status.  He may have some underlying neuropathy explaining  his paresthesias.  I have encouraged him to stay as active as possible,  and I plan on seeing him back in 1 year with follow up ABI.  Fortunately, he is not a smoker.   Di Kindle. [name], M.D.  Electronically Signed   CSD/MEDQ  D:  03/16/2007  T:  03/17/2007  Job:  [id number]

## 2010-10-22 NOTE — Assessment & Plan Note (Signed)
OFFICE VISIT   Logan, Hill  DOB:  04/11/1932                                       07/11/2008  ZOXWR#:[id number]   I saw the patient in the office today for followup after his recent  right femoral to below knee pop bypass with a vein graft on 06/21/2008.  This is a 77 year old gentleman I have been following with peripheral  vascular disease.  He developed rest pain in the right foot and pain in  the right great toe with a nonhealing wound of the toenail bed.  He  underwent arteriogram which showed a superficial femoral artery  occlusion with reconstitution of the below knee popliteal artery and  single vessel runoff via the peroneal artery.  It was felt that his best  hope for limb salvage was fem-pop bypass grafting.  Of note, he had  undergone resection of a lung cancer by Dr. Edwyna Shell prior to his bypass  and has done well from that standpoint.  He comes in for his first  followup visit.  His only complaint has been moderate right leg  swelling.   On examination blood pressure is 121/70, heart rate is 62.  His  incisions are all healing nicely.  He has moderate right leg swelling.  The right foot is warm and well-perfused.  He did have Doppler studies  in our office today which showed monophasic Doppler signals in the right  foot with an ABI of 100% on the right and 84% on the left.   We have discussed the importance of intermittent leg elevation and he  will work on getting the swelling down.  The toenail bed is open and I  have instructed him to continue to soak this with lukewarm Dial soap  soaks.  If this progresses he could require a toe amputation which we  have previously discussed.  He was hopeful that we could get by without  but he understands this is certainly a very real possibility.  I will  see him back in 6 weeks.  He knows to call sooner if he has problems.   Di Kindle. [name], M.D.  Electronically Signed   CSD/MEDQ  D:   07/11/2008  T:  07/12/2008  Job:  [id number]   cc:   Ines Bloomer, M.D.

## 2010-10-22 NOTE — Assessment & Plan Note (Signed)
OFFICE VISIT   COLLIER, BOHNET  DOB:  1932/04/18                                       01/18/2008  EAVWU#:[id number]   I saw the patient in the office today for followup after his recent  arteriogram.  I had seen him on 01/04/2008 with a chief complaint of  right great toe pain which he had had for a couple of months.  He also  had some rest pain in the right foot and for this reason he underwent an  arteriogram which showed a long segment occlusion of the superficial  femoral artery with reconstitution of the below knee popliteal artery  and single vessel runoff via the peroneal artery.  He came in today to  discuss revascularization.   He has had a previous right femoral embolectomy and vein patch  angioplasty of the right common femoral artery.  He is on Coumadin  because of his history of embolic disease.   REVIEW OF SYSTEMS:  He has had no recent chest pain, chest pressure,  palpitations or arrhythmias.   PHYSICAL EXAMINATION:  Vital signs:  On physical examination blood  pressure is 98/54, heart rate is 62.  Vascular:  He has a palpable  femoral pulse on the right with a diminished femoral pulse on the left.  I cannot palpate pedal pulses.  He does have a monophasic peroneal  signal with the Doppler on the right.   Given the discoloration of the right great toe with rest pain I have  recommended right fem-pop bypass grafting as his best chance for limb  salvage.  I am afraid without revascularization the toe will likely  progress and this could clearly become a limb threatening problem.  He  has had a previous myocardial infarction and has undergone previous  coronary revascularization approximately 5-6 years ago.  Vein was taken  from the left leg for that procedure.  He is scheduled to see Dr. Excell Seltzer  in early September so we will hold off on surgery until he has had a  preoperative cardiac evaluation by Dr. Excell Seltzer.  Assuming he is cleared  for  surgery we can then schedule him for later that month.  If the  symptoms progress before that he knows to call sooner.   Di Kindle. [name], M.D.  Electronically Signed   CSD/MEDQ  D:  01/18/2008  T:  01/19/2008  Job:  [id number]   cc:   Veverly Fells. Excell Seltzer, MD  Valetta Mole. Swords, MD

## 2010-10-22 NOTE — Assessment & Plan Note (Signed)
OFFICE VISIT   CHOSEN, GESKE  DOB:  1931-09-22                                        September 20, 2008  CHART #:  [id number]   The patient returned today, his blood pressure was 100/60, pulse 74,  respirations 16, sats were 99%.  Incisions were well healed.  Chest x-  ray showed normal postoperative change and recent CT scan was negative  for recurrence.  He will have another CT scan in June, we will see him  back again at that time.  He is being followed by Dr. Clelia Croft.   Ines Bloomer, M.D.  Electronically Signed   DPB/MEDQ  D:  09/20/2008  T:  09/21/2008  Job:  [id number]

## 2010-10-22 NOTE — Op Note (Signed)
Logan, Hill                ACCOUNT NO.:  [id number]   MEDICAL RECORD NO.:  [id number]          PATIENT TYPE:  INP   LOCATION:  2305                         FACILITY:  [facility]   PHYSICIAN:  Ines Bloomer, M.D. DATE OF BIRTH:  1931-09-04   DATE OF PROCEDURE:  04/26/2008  DATE OF DISCHARGE:                               OPERATIVE REPORT   PREOPERATIVE DIAGNOSIS:  Right lower lobe mass.   POSTOPERATIVE DIAGNOSIS:  Non-small cell lung cancer, right lower lobe.   OPERATION PERFORMED:  Right VATS minithoracotomy and right lower  lobectomy.   SURGEON:  Ines Bloomer, MD   FIRST ASSISTANT:  Berline Chough, PA-C   SECOND ASSISTANT:  Brie Fully   After percutaneous   ABDOMEN:  of all monitoring lines, the patient was turned to the right  lateral thoracotomy position, was prepped and draped in usual sterile  manner.  A dual-lumen tube was inserted.  The right lung was deflated.  Two trocar sites made on the anterior axillary line at the sixth  intercostal space and one at posterior axillary line at the seventh  intercostal space.  Two trocars were inserted.  Lesion was seen in the  posterior segment of the basilar lateral segment of the right lower  lobe.  A 5-7 cm incision was made laterally, partially dividing the  latissimus, and reflecting the serratus anteriorly entering the sixth  intercostal space.  A small TPA was placed in the intercostal space and  dissection was started taking down the inferior pulmonary ligament with  electrocautery dissecting out a 9L node and we dissected out several 7  nodes out of the subcarinal space.  Attention was then turned to the  inferior pulmonary vein.  It was dissected out and it had 2 separate  branches, a superior segmental branch and a basilar branch and both of  these were dissected out and looped with vascular tape.  Attention was  turned to the fissure and dissection was carried down through some 11R  nodes which were  dissected out to expose the basilar branch of the  pulmonary artery.  The inferior portion of the fissure was divided with  an Auto Suture 45 green stapler.  Several 11R nodes were dissected free  from __________ in the right middle lobe bronchus.  Dissection was  started superiorly dissecting out several more 11R nodes and 10R nodes  and exposing a branch to the superior segment of the right lower lobe  which off the base of the branch came the posterior segmental branch of  the pulmonary artery to the right upper lobe.  We then dissected down  starting posteriorly and anteriorly to be dissected down to the bronchus  intermedius and then we were able to divide the superior portion of  fissure with an Auto Suture 60 stapler with 2 applications.  Once that  was done, this exposed the arteries and they were divided with an Auto  Suture 2-mm stapler in the basilar branch and superior segmental branch  and both inferior pulmonary vein branches were divided with the Auto  Suture 60 stapler.  The bronchus was stapled with the TA-30 and divided  distally with knife.  The right lower lobe was removed.  It was a non-  small cell lung cancer with negative margins.  No air leak was seen from  the bronchus and FloSeal was applied to the staple line.  Two chest  tubes were placed and a right-angled chest tube through the posterior  trocar site and straight 28 chest tube through the anterior trocar site.  A Marcaine block was  done in the usual fashion.  A single On-Q was inserted in the usual  fashion.  The chest was closed with 4 pericostals drilling through the  sixth rib passing around the seventh rib, #1 Vicryl in the muscle layer,  2-0 Vicryl in subcutaneous tissue, and Dermabond for the skin.  The  patient was returned to the recovery room in stable condition.      Ines Bloomer, M.D.  Electronically Signed     DPB/MEDQ  D:  04/27/2008  T:  04/27/2008  Job:  [id number]

## 2010-10-22 NOTE — Discharge Summary (Signed)
[name], [name]                ACCOUNT NO.:  [id number]   MEDICAL RECORD NO.:  [id number]          PATIENT TYPE:  INP   LOCATION:  3301                         FACILITY:  [facility]   PHYSICIAN:  Ines Bloomer, M.D. DATE OF BIRTH:  1931-11-03   DATE OF ADMISSION:  04/26/2008  DATE OF DISCHARGE:  05/01/2008                               DISCHARGE SUMMARY   FINAL DIAGNOSIS:  Right lower lobe mass, positive invasive poorly  differentiated squamous cell carcinoma, T2a N0 Mx.   SECONDARY DIAGNOSES:  1. Ischemia right foot, loss of male.  Followed by Dr. Waverly Ferrari, scheduled for revascularization once healed from right      lower lobectomy.  2. History of tobacco abuse.  3. History of myocardial infarction, status post coronary artery      bypass grafting done by Dr. Laneta Simmers.  4. Hypertension.  5. BPH.  6. Atrial fibrillation.  7. Hyperlipidemia.  8. History of anemia.   IN-HOSPITAL OPERATIONS AND PROCEDURES:  1. Right video-assisted thoracoscopic surgery to right thoracotomy,      right lower lobectomy with lymph node biopsy.   HISTORY AND PHYSICAL AND HOSPITAL COURSE:  [name] is a 77 year old  gentleman with a history of myocardial infarction, status post coronary  artery bypass grafting as well as history of hypertension,  hyperlipidemia, and tobacco abuse.  The patient developed ischemia of  his right foot with loss of nail.  He was seen and evaluated by Dr.  [name].  Dr. [name] was planning to proceed with revascularization of  his right lower extremity.  In the workup, the patient was noted to have  a right lower lobe lesion.  Chest x-ray done as well as CT scan done  revealed a 4.3 cm spiculated mass in the right lower lobe with multiple  lymph nodes.  He was referred to Dr. Edwyna Shell for further evaluation.  Dr.  Edwyna Shell discussed with the patient undergoing right lower lobectomy.  He  discussed risks and benefits with the patient.  The patient also  understanding and agreed to proceed.  Surgery was scheduled for April 26, 2008.  The patient's right foot was noted to be stable and Dr.  [name] agree with proceeding with resection of this lesion prior to  revascularization of the lower extremity.  Plan is to admit the patient  in the hospital day prior to surgery to evaluate PT/INR levels due to  the patient being on Coumadin for atrial fibrillation.  He was told to  stop taking Coumadin on April 22, 2008.  Details of the patient's  past medical history and physical exam, please see dictated H and P.   The patient was admitted to [facility] on April 26, 2008.  On admission, the patient was stable and stated his right foot had  decreased in swelling.  He denied any significant change in temperature  or pain.  The patient did stop taking his Coumadin on April 22, 2008.  His PT/INR level at this time was 1.3.  He was felt to be stable and  ready for  surgery in a.m. April 27, 2008.  Dr. [name] was consulted  and made aware of the patient's admission.   The patient was taken to the operating room on April 27, 2008, where  he underwent right video-assisted thoracoscopic surgery with right  thoracotomy, right lower lobectomy with lymph node biopsy.  The patient  tolerated this procedure well and was transferred to the intensive care  unit in stable condition.  The patient was able to be extubated  following surgery.  He was noted to be hemodynamically stable  postoperatively.  Post extubation, the patient was noted to be alert and  oriented x4.  Neuro intact.  Dr. [name] did evaluate the patient  postoperatively.  He felt that right foot remained stable.  He stated  that the foot is stable.  He would prefer the patient to recover from  thoracic surgery and recommended continuing Coumadin postoperatively  once stable.  The patient was planned to follow up with Dr. [name] in 2-  3 weeks postoperatively for  further evaluation and scheduling of  peripheral revascularization surgery.  From a surgical standpoint, the  patient did quite well.  Daily chest x-rays obtained.  Chest x-rays  remained stable.  The patient had no air leak.  Chest tubes were removed  in the normal fashion.  Followup chest x-ray done on May 01, 2008,  remained stable with no pneumothorax.  The patient continued to use his  incentive spirometer.  He was able to be weaned off oxygen with O2 sats  greater than 95% on room air.  The patient's pathology report did come  back showing positive for invasive poorly-differentiated squamous cell  carcinoma with T2a N0, Mx.  Postoperatively, the patient was out of bed  and ambulating well.  He was tolerating diet well and no nausea or  vomiting noted.  He was restarted on his Coumadin dosing.  The patient  remained in atrial fibrillation.  Last PT/INR was 1.3.  The patient was  also continued on p.o. iron.  He remained hemodynamically stable.  The  patient's incisions remained clean, dry, and intact and healing well.   On May 01, 2008, the patient was noted to be afebrile.  Heart rate  and blood pressure were stable.  He remained in atrial fibrillation.  He  is sating greater than 90% on room air.  Labs show a white count of  10.0, hemoglobin of 9.0, hematocrit 28.0, platelet count 203.  INR was  1.3.  Chest x-ray is stable with no pneumothorax.  The patient is felt  to be stable and ready for discharge home today, May 01, 2008,  postop day #4.   FOLLOWUP APPOINTMENT:  His followup appointment will be arranged with  Dr. Edwyna Shell in 1 week.  Our office will contact the patient of this  information.  The patient will need to obtain PA and lateral chest x-ray  30 minutes prior to this appointment.  The patient will need to follow  up with Dr. [name] in 2-3 weeks.  He will need to contact his office to  make these arrangements.  The patient needs to keep his appointment  with  our [facility] for May 05, 2008 this Friday for PT/INR check.   ACTIVITY:  The patient was instructed no driving to released to do so,  no heavy lifting over 10 pounds.  He is told to ambulate 3-4 times per  day progress as tolerated and to continue his breathing exercises.   INCISIONAL CARE:  The patient is told to shower, washing his incisions  using soap and water.  He is to contact the office if he develops any  drainage or opening from any of his incision sites.   DIET:  The patient was educated on diet to be low fat, low salt.   DISCHARGE MEDICATIONS:  1. Diltiazem 120 mg daily.  2. Coumadin 5 mg daily.  3. Prednisone, continue home dose.  4. Toprol-XL 100 mg daily.  5. Finasteride 5 mg daily.  6. Actonel 35 mg weekly.  7. Calcium 600 mg with vitamin D daily.  8. Folic acid 400 mg daily.  9. Iron 325 mg daily.  10.Aspirin 81 mg daily.  11.Statin 40 mg at night.  12.Flomax 0.4 mg daily.  13.Oxycodone 5 mg one to two tabs q.4-6 h. p.r.n.  14.The patient's lisinopril was held secondary to blood pressure      stable, low.   DISCHARGE INSTRUCTIONS:  The patient is told if he develops any further  ischemic changes in his right lower extremity, foot becomes cold,  discolored, he is to contact Dr. Adele Dan office immediately.  The  patient understands.      Theda Belfast, [place]      Ines Bloomer, M.D.  Electronically Signed    KMD/MEDQ  D:  05/01/2008  T:  05/01/2008  Job:  [id number]   cc:   Di Kindle. [name], M.D.  Dr. Allyson Sabal

## 2010-10-22 NOTE — Op Note (Signed)
NAMEKAYDIN, KARBOWSKI                ACCOUNT NO.:  [id number]   MEDICAL RECORD NO.:  [id number]          PATIENT TYPE:  INP   LOCATION:  3312                         FACILITY:  [facility]   PHYSICIAN:  Di Kindle. [name], M.D.DATE OF BIRTH:  1932/06/01   DATE OF PROCEDURE:  06/21/2008  DATE OF DISCHARGE:                               OPERATIVE REPORT   PREOPERATIVE DIAGNOSIS:  Rest pain in the right foot with nonhealing  right great toe wound.   POSTOPERATIVE DIAGNOSIS:  Rest pain in the right foot with nonhealing  right great toe wound.   PROCEDURE:  Right femoral to below-knee popliteal artery bypass graft  with a non-reverse translocated saphenous vein graft and intraoperative  arteriogram.   SURGEON:  Di Kindle. [name], MD   ASSISTANT:  Jerold Coombe, PA   ANESTHESIA:  General.   INDICATIONS:  This is a 77 year old gentleman whom I have been following  with peripheral vascular disease.  He developed rest pain in the right  foot and pain in the right great toe with a nonhealing wound at the  toenail bed.  He underwent an arteriogram which showed a superficial  femoral artery occlusion, reconstitution of the below-knee popliteal  artery and single-vessel runoff via the peroneal artery.  It was felt  that his best option for limb salvage was fem-pop bypass grafting.  The  procedure and potential complications were discussed with the patient  preoperatively.   TECHNIQUE:  The patient was taken to the operating room and the right  lower extremity was prepped and draped in the usual sterile fashion.  A  longitudinal incision was made in the right groin.  The patient had  previous surgery here and had a Dacron patch angioplasty of the common  femoral artery after a thrombectomy.  The common femoral artery was  exposed proximally and distally enough that I could clamp proximally and  distally.  Through the same incision, the saphenofemoral junction was  dissected free  and using 3 additional incisions along the medial aspect  of the right leg, the saphenous vein was harvested to the proximal calf.  Branches were divided between clips and 3-0 silk ties.  The vein was  ligated distally, irrigated up nicely with heparinized saline, and was  removed from its bed.  The saphenofemoral junction was clamped.  The  saphenous vein excised from the femoral vein and the femoral vein  oversewn with a 5-0 Prolene suture.  This was stored in heparinized  saline.  Through the distal incision, the below-knee popliteal artery  was exposed.  There was calcific disease present, but it did appear to  be patent at this level.  Next, a tunnel was created from the below-knee  incision at the groin incision.  The patient was then heparinized.  The  common femoral artery was clamped proximally and distally, and a  longitudinal arteriotomy was made through the patch.  The proximal valve  in the vein was sharply excised and the vein was sewn in a non-reverse  fashion end-to-side to the common femoral artery at the level of the  patch  using continuous 5-0 Prolene suture.  Prior to completing  anastomosis, the inflow was tested and there was good inflow.  The  anastomosis was completed and a radiopaque cardiac marker was placed at  the proximal anastomosis.  Next, a retrograde [place] valvulotome was used  to lyse the valves.  This was passed twice with no evidence of retained  valves.  There was good flow through the graft at this point.  The graft  was then marked with a twisting and then it was brought through the  previously created tunnel.  The tourniquet was placed in the thigh.  The  leg exsanguinated with an Esmarch bandage.  The tourniquet inflated to  300 mmHg.  Under tourniquet control, a longitudinal arteriotomy was made  in the below-knee popliteal artery.  This was extended with Potts  scissors.  The vein was cut to the appropriate length, spatulated, and  sewn  end-to-side to the below-knee popliteal artery using 2 continuous 6-  0 Prolene sutures parachuting both the heel and toe of the anastomosis.  Prior to completing the anastomosis, the tourniquet was released.  The  artery back bled and flushed appropriately and the anastomosis was  completed.  Flow was reestablished to the right foot.  There was a good  peroneal signal with the Doppler which was graft dependent.  Hemostasis  was obtained in the wounds.  The groin incision was closed with deep  layer of 2-0 Vicryl, subcutaneous layer of 2-0 Vicryl, and the skin  closed with a 4-0 subcuticular stitch.  The remaining incisions were  closed with deep layer of 3-0 Vicryl and the skin closed with staples.  I did shoot an intraoperative arteriogram which showed no technical  problems.  The patient tolerated the procedure well and was transferred  to recovery room in satisfactory condition.  All needle and sponge  counts were correct.      Di Kindle. [name], M.D.  Electronically Signed     CSD/MEDQ  D:  06/21/2008  T:  06/22/2008  Job:  [id number]

## 2010-10-22 NOTE — Letter (Signed)
April 05, 2008   Di Kindle. [name], MD  [address]  [place], [place] [address]   Re:  Logan, Hill                DOB:  26-Apr-1932   Dear [name]:   I appreciate the opportunity of seeing the patient.  This is a 77-year-  old patient who has had a long history of tobacco-related diseases.  He  developed ischemia of his right foot with loss of the nail and has  scheduled for a femoral posterior bypass.  He quit smoking in 2003, but  did smoke 1-1/2 packs of cigarettes for a long time.  In 2003, he had a  previous myocardial infarction and underwent coronary artery bypass by  Dr. Evelene Croon to his left main and severe 2-vessel coronary artery  disease.  In his preop workup, he was found to have a right lower lobe  lesion.  On chest x-ray and then CT scan revealed a 4.3-cm spiculated  mass in the right lower lobe with multiple lymph nodes, but not one that  was enlarged.  He was referred here for evaluation.  He has had no  hemoptysis, fever, chills, or excessive sputum.  Now his weight has been  stable.   FAMILY HISTORY:  Noncontributory.   SOCIAL HISTORY:  He has 2 children.  He quit smoking in 2003.  He does  not drink alcohol on a regular basis.   REVIEW OF SYSTEMS:  He is 146 pounds.  He is 5 feet 9 inches.  GENERAL:  His weight has been stable.  CARDIAC:  No angina or atrial fibrillation.  PULMONARY:  See history of present illness.  GI:  He had some previous  melena.  GU:  No, kidney disease, dysuria, or frequent urination.  VASCULAR:  See history of present illness.  No DVT or TIAs.  NEUROLOGICAL:  He has had some dizziness, no headache, blackouts, or  seizures.  MUSCULOSKELETAL:  He has arthritis.  No psychiatric  illnesses.  No change in eyesight or hearing.  He has problems with  anemia, no problems with bleeding or clotting disorders.   MEDICATIONS:  1. Cartia XT 120 mg daily.  2. Coumadin 5 mg daily.  3. Prednisone 5 mg daily.  4. Lisinopril 20 mg  daily.  5. Toprol 100 mg daily.  6. Flomax 0.4 daily.  7. Actonel 35 mg weekly.  8. Calcium.  9. Vitamin D.  10.Folic acid.  11.Simvastatin 40 mg a day.  12.Oxycodone for pain 5/500.  13.Ferrous sulfate.   ALLERGIES:  He has no known allergies.   PHYSICAL EXAMINATION:  GENERAL:  He is a well-developed Caucasian male  in no acute distress.  VITAL SIGNS:  His blood pressure is 90/60, pulse 72, respirations 18,  and sats were 92%.  HEAD, EYES, EARS, NOSE, AND THROAT:  Unremarkable.  NECK:  Supple without thyromegaly.  There is no supraclavicular or  axillary adenopathy.  CHEST:  Clear to auscultation and percussion.  There is a median  sternotomy incision.  HEART:  Regular sinus rhythm.  No murmurs.  ABDOMEN:  Soft with no hepatosplenomegaly.  EXTREMITIES:  Pulses are 1+ femoral pulses, no pulse in the right foot.  NEUROLOGICAL:  He is oriented x3.  Sensory and motor intact.   ASSESSMENT:  I think the patient has between a clinical stage IB to  clinical stage IIIA non-small cell lung cancer.   PLAN:  To get a brain scan, PET scan,  and pulmonary function tests.  Then, we will see him back again as scheduled for biopsies or further  recommendations.  He is having a lot of pain in that foot so we may have  to consider that and possibly a first operation.   I appreciate the opportunity of seeing the patient.   Sincerely,   Ines Bloomer, M.D.  Electronically Signed   DPB/MEDQ  D:  04/05/2008  T:  04/06/2008  Job:  [id number]   cc:   Dr. __________

## 2010-10-22 NOTE — Assessment & Plan Note (Signed)
OFFICE VISIT   [name], [name]  DOB:  Jun 19, 1931                                        September 05, 2009  CHART #:  [id number]   The patient returns today.  His blood pressure is 127/56, pulse 66,  respirations 18, sats were 98%.  His incisions are well healed.  His  chest x-ray is stable.  He is doing well overall.  I will have him  repeat CT scan in June.  I will see him back at that time.  His legs  have been stable since Dr. [name] operated on his leg.   Ines Bloomer, M.D.  Electronically Signed   DPB/MEDQ  D:  09/05/2009  T:  09/06/2009  Job:  [id number]

## 2010-10-22 NOTE — Procedures (Signed)
BYPASS GRAFT EVALUATION   INDICATION:  Followup of a fem-pop bypass graft.   HISTORY:  Diabetes:  No.  Cardiac:  No.  Hypertension:  Yes.  Smoking:  Previous.  Previous Surgery:  Right fem-pop bypass graft.   SINGLE LEVEL ARTERIAL EXAM                               RIGHT              LEFT  Brachial:                    140                133  Anterior tibial:             75                 142  Posterior tibial:            Noncompressible    83  Peroneal:  Ankle/brachial index:        0.54               1.01 (with DPA 0.59)   TOE BRACHIAL INDEX RIGHT:  0.44   TOE BRACHIAL INDEX LEFT:  12   PREVIOUS TBI:  Date:  01/02/2009  RIGHT:  0.28  LEFT:  0.14   LOWER EXTREMITY BYPASS GRAFT DUPLEX EXAM:   DUPLEX:     1. Triphasic Doppler waveforms proximal to and in proximal bypass        graft     2. Biphasic mid and distal bypass graft and distal to bypass graft.    IMPRESSION:  1. Ankle brachial indices suggests severe disease bilaterally.  2. Toe brachial indices are abnormal.        ___________________________________________  Di Kindle. [name], M.D.   CJ/MEDQ  D:  03/22/2009  T:  03/22/2009  Job:  [id number]

## 2010-10-22 NOTE — Assessment & Plan Note (Signed)
OFFICE VISIT   [name], [name]  DOB:  14-Mar-1932                                        May 10, 2008  CHART #:  [id number]   HISTORY OF PRESENT ILLNESS:  This is a 77 year old African American male  who was found to have a right lower lobe mass.  He underwent a right  VATS, mini-thoracotomy, and right lower lobectomy with lymph node  dissection by Dr. Edwyna Hill on 04/26/2008.  Pathology of the right lower  lobe revealed invasive poorly differentiated squamous cell carcinoma.  No metastatic carcinoma identified in the lymph nodes.  The patient  presents to the office today for routine followup for the aforementioned  surgery.  The patient was discharged from [facility] on 05/01/2008.  Since that time, he has occasional incisional pain, but denies any  shortness of breath.   PHYSICAL EXAMINATION:  GENERAL:  This is a pleasant 77 year old [place]  American male who is in no acute distress who is alert, oriented, and  cooperative.  VITAL SIGNS:  BP 83/48, heart rate 82, respirations 18, and O2 sat 98%  on room air.  PULMONARY:  Clear to auscultation bilaterally.  No rales or rhonchi.  Breath sounds are diminished at the right base.  EXTREMITIES:  No  cyanosis, clubbing, or edema.  Right posterior chest wounds clean, dry, and well healed.  Chest tube  sutures removed without difficulty.   Chest x-ray showed a moderate hydropneumothorax.   IMPRESSION AND PLAN:  1. Surgically stable, status post right video-assisted thoracoscopy,      right miniature thoracotomy, right lower lobectomy on 04/26/2008.  2. The patient was instructed he may begin driving short distances.  3. He was instructed he may increase his activity slowly.  4. The patient has a history peripheral vascular disease and ischemia      of the right foot.  He has a followup appointment to see Dr. Durwin Hill      regarding further vascular intervention.  5. The patient has a history of coronary  artery disease (status post      coronary artery bypass graft x4 in August 2003 with Dr. Laneta Hill.  He      is on Coumadin and he will followup at the [facility] per their      discretion.  The patient will return to see Dr. Edwyna Hill in 2 weeks      with a chest x-ray.   [name], M.D.  Electronically Signed   [name]  D:  Hill  T:  Hill  Job:  Hill   cc:   [name], P.A.  [name], M.D.

## 2010-10-22 NOTE — Procedures (Signed)
CEPHALIC VEIN MAPPING   INDICATION:  End-stage renal disease.   HISTORY:  End-stage renal disease.   EXAM:  Bilateral cephalic vein duplex.   The right cephalic vein is compressible.   Diameter measurements range from [facility] fossa to upper arm is 0.47 to 0.30.   The left cephalic vein is compressible.   Diameter measurements range from 0.28 to 0.41 cm.   See attached worksheet for all measurements.   IMPRESSION:  Patent left cephalic vein which is of acceptable diameter  for use as a dialysis access site.   ___________________________________________  Di Kindle. [name], M.D.   MG/MEDQ  D:  03/21/2008  T:  03/21/2008  Job:  [id number]

## 2010-10-22 NOTE — Letter (Signed)
April 11, 2008   Di Kindle. [name], MD  [address]  [place], [place] [address]   Re:  BRNADON, EOFF                DOB:  08/10/1931   Dear Thayer Ohm,   I saw the patient back today and reviewed his pulmonary function tests.  His FVC was 4.29 with an FEV-1 of 2.92 and diffusion capacity of 78%.  Overall, his PET scan was positive for his right lower lobe lesions, and  I think, this is probably a squamous cell cancer.  Unfortunately, there  also was a lesion in his prostate gland.  He has been followed by Dr.  Vernie Ammons for this.  His other complaint was increasing pain in his foot  and I am worried that he is progressive ischemia in his foot.  I will  plan to get him into see Dr. Vernie Ammons in the next week or so and I have  scheduled him to be admitted on April 26, 2008, as surgery is  April 27, 2008.  He will stop his Coumadin in 4 days before and I  will let you know when he is admitted, so we can see him in the hospital  inside where he need to do fem-pop surgery while he is in the hospital.  I appreciate the opportunity of seeing the patient.   Ines Bloomer, M.D.  Electronically Signed   DPB/MEDQ  D:  04/11/2008  T:  04/11/2008  Job:  [id number]   cc:   Veverly Fells. Vernie Ammons, M.D.

## 2010-10-22 NOTE — Letter (Signed)
July 19, 2008   Lajuana Matte, MD  [id number]  [place], [place] [address]   Re:  [name], [name]                DOB:  [birth year]   Dear Arbutus Ped,   I saw the patient in the office today.  We initially did a right lower  lobectomy on him in November and then because of ischemia of his right  leg, he had a right fem-pop performed by Dr. [name] in December.  He  now returns today and is doing well since the surgery of his leg and his  chest x-ray looks good with normal postoperative changes since he was  stage Ib.  I will refer him to you for evaluation.  The size of his  cancer was 3.8 cm with negative nodes.  His blood pressure is 113/52,  pulse 67, respirations 18, and sats were 97%.  I will see him back in 2  months with a chest x-ray.   Ines Bloomer, M.D.  Electronically Signed   DPB/MEDQ  D:  07/19/2008  T:  07/19/2008  Job:  [id number]   cc:   Veverly Fells. Vernie Ammons, M.D.  Di Kindle. [name], M.D.

## 2010-10-22 NOTE — Assessment & Plan Note (Signed)
OFFICE VISIT   [name], [name]  DOB:  Mar 21, 1932                                        March 07, 2009  CHART #:  [id number]   The patient came for followup today.  His blood pressure was 106/63,  pulse 84, respirations 18, and sats were 95%.  There is no evidence of  recurrence of his cancer.  He will be seen by Dr. Clelia Croft and then we  will see him back again in 4 months with another chest x-ray.  His first  2 initial scans were negative.   Ines Bloomer, M.D.  Electronically Signed   DPB/MEDQ  D:  03/07/2009  T:  03/08/2009  Job:  [id number]

## 2010-10-22 NOTE — Letter (Signed)
November 29, 2008   Blenda Nicely. Shadad  [address] [place]  [facility]  [place], [place] [address]   Re:  [name], [name]                DOB:  01-Sep-1931   Dear Dr. Clelia Croft:   I saw the patient back for followup today.  His blood pressure was  103/53, pulse 80, respirations 20, and saturations were 100%.  His  incisions were well healed.  His CT scan showed that he had a right  lower lobectomy with no evidence of recurrence and has known  postoperative changes.  Also, his abdominal CT scan was negative.  He is  doing well from our standpoint.  His legs were also stable.  We will see  him back again in 3 weeks with a chest x-ray.   Ines Bloomer, M.D.  Electronically Signed   DPB/MEDQ  D:  11/29/2008  T:  11/30/2008  Job:  [id number]

## 2010-10-22 NOTE — Discharge Summary (Signed)
[name], [name]                ACCOUNT NO.:  [id number]   MEDICAL RECORD NO.:  [id number]          PATIENT TYPE:  INP   LOCATION:  2041                         FACILITY:  [facility]   PHYSICIAN:  Di Kindle. [name], M.D.DATE OF BIRTH:  07-26-1931   DATE OF ADMISSION:  06/21/2008  DATE OF DISCHARGE:  06/26/2008                               DISCHARGE SUMMARY   FINAL DISCHARGE DIAGNOSES:  1. Peripheral vascular disease with nonhealing right toe wound.  2. Coronary artery disease.  3. Right lobectomy secondary to cancer.  4. Dyslipidemia.  5. Acute blood loss anemia.   PROCEDURES PERFORMED:  Right common femoral artery to below-knee  popliteal bypass grafting using non-reverse translocated saphenous vein  from the right lower extremity with intraoperative angiogram x1 by Dr.  [name] June 21, 2008.   DISCHARGE MEDICATIONS:  He was instructed to resume all previous  medications consisting of:  1. Cartia XT 120 mg p.o. daily.  2. Coumadin 5 mg p.o. daily.  3. Prednisone 5 mg one-half tablets p.o. daily.  4. Lisinopril 20 mg p.o. daily.  5. Toprol-XL 100 mg p.o. daily.  6. Finasteride 5 mg p.o. daily.  7. Actonel 35 mg.  8. Calcium 600 plus D p.o. daily.  9. Folic acid 400 mg p.o. daily.  10.Ferrous sulfate 325 mg p.o. daily.  11.Aspirin 81 mg p.o. daily.  12.Simvastatin 40 mg p.o. nightly.  13.Flomax 0.4 mg p.o. daily.  14.Lortab 7.5/500 1-2 tablets p.o. q.4 h. p.r.n. pain.  He is given a      prescription for an additional 40.   CONDITION AT DISCHARGE:  Stable, improving.   DISPOSITION:  He is being discharged home in stable condition with his  wounds healing well.  He is given careful instructions regarding the  care of his wounds and his activity level.  He is given an appointment  to see Dr. [name] in 2-3 weeks for followup with ABIs and staple  removal.  The office will make arrangements for the visit.   BRIEF IDENTIFYING STATEMENT:  Complete details, please  refer the typed  history and physical.  Briefly, this very pleasant 77 year old gentleman  was referred to Dr. [name] for evaluation of a nonhealing toe ulcer on  his right toe.  He was found to have peripheral vascular disease with a  blockage in his right SFA.  Dr. [name] recommended right common femoral  artery to below-knee popliteal arterial bypass grafting using non-  reverse translocated saphenous vein from the right lower extremity.  He  was given careful instructions regarding the care of his wounds and  after careful consideration elected to proceed with surgery.   HOSPITAL COURSE:  Preoperative workup was completed as an outpatient.  He was brought in through same-day surgery and on June 21, 2008,  underwent the aforementioned procedure.  For complete details, please  refer the typed history and physical.  The procedure was without  complications.  He was returned to the Post Anesthesia Care Unit  extubated.  Following stabilization, he was transferred to a bed on a  Surgical Step-Down Unit.  He was observed  overnight and was able to be  transferred to a bed on a surgical convalescent floor.   His hospital course was benign.  He was walking and improving with  physical therapy.  His wounds were healing well.  He did have an acute  blood loss anemia which was asymptomatic.  His H&H was at discharge 7.5  and 23.6.  His anticoagulation was resumed.  He was therapeutic at  discharge.  His INR was 2.3.  PT was 26.8.   He was discharged to home in stable condition on June 26, 2008.      Wilmon Arms, PA      Di Kindle. [name], M.D.  Electronically Signed    KEL/MEDQ  D:  06/26/2008  T:  06/26/2008  Job:  [id number]

## 2010-10-22 NOTE — H&P (Signed)
HISTORY AND PHYSICAL EXAMINATION   March 21, 2008   Re:  [name], [name]                DOB:  [birth year]   This is a pleasant 77 year old gentleman who I had seen on 01/04/2008  with a chief complaint of pain in the right great toe which began in  May.  He also had a small amount of drainage from under the toenail bed  and rest pain in the right foot.  He had bilateral extremity  claudication which had been stable for years.  He had no recent fever or  chills.   PAST MEDICAL HISTORY:  Significant for coronary artery disease.  He  underwent coronary artery bypass surgery with vein taken from the left  leg in August 2003.  In addition, he has a history of arthritis and is  on prednisone for this.  In addition, the patient has a history of  atrial flutter status post radio frequency ablation.  He denies any  history of diabetes, hypertension, hypercholesterolemia, history of  congestive heart failure or history of COPD.   FAMILY HISTORY:  This no history of premature cardiovascular disease.   SOCIAL HISTORY:  He is married.  He quit smoking 7 years ago.   ALLERGIES:  No known drug allergies.   MEDICATIONS:  1. Cardia XT 120 mg p.o. daily.  2. Coumadin 5 mg p.o. daily.  He was instructed to take his last dose      on 10/23/2007.  3. Prednisone 2.5 mg daily.  4. Lisinopril 20 mg p.o. daily.  5. Simvastatin 40 mg p.o. daily.  6. Iron 325 mg p.o. b.i.d.  7. Toprol XL 5 mg p.o. daily.  8. Cardia XT 120 mg p.o. daily.  9. Flomax 0.4 mg p.o. daily.  10.Aspirin 81 mg p.o. daily.  11.Folic acid 400 mg p.o. daily.   REVIEW OF SYSTEMS:  GENERAL:  He has had no recent weight loss, weight  gain, problems with his appetite or fever.  CARDIAC:  He had no chest pain, chest pressure, palpitations or  arrhythmias.  He had no significant dyspnea on exertion.  PULMONARY:  He had no recent productive cough, bronchitis, asthma or  wheezing.  GI:  He had no recent change in  his bowel habits and has no history of  peptic ulcer disease.  GU:  He had no dysuria or frequency.  VASCULAR:  He does have bilateral lower extremity claudication and rest  pain in the right foot.  He had no history of stroke, TIAs or amaurosis  fugax.  No history of DVT or phlebitis.  NEURO:  He had no dizziness, blackouts, headaches or seizures.  ORTHO:  He does have a history of arthritis and joint pain.  HEMATOLOGIC:  He has had no bleeding problems or clotting disorders.  ENT:  Had no recent changes eyesight or hearing.   PHYSICAL EXAMINATION:  This is a pleasant 77 year old.  He has palpable  femoral pulses.  I cannot palpate popliteal or pedal pulses on either  side.  He has monophasic Doppler signals in both feet.  He has an open  wound on the right great toenail bed.  He has swelling and discoloration  of the right great toe.   He had a previous arteriogram on 01/10/2008, which showed a long segment  SFA occlusion with reconstitution of popliteal artery level and single  vessel runoff via the peroneal artery.   Given the nonhealing wound of  the right great toe with significant  infrainguinal arterial occlusive disease, I have explained this is  clearly a limb threatening situation.  I think his best chance for  revascularization is right to below knee pop bypass grafting.  Even with  successful revascularization there is risk of limb loss given that he  has tibial occlusive disease with single-vessel runoff via the peroneal  artery.  However, I think this is his best chance for limb salvage.  We  have discussed the indications for surgery and potential complications  including but not limited to bleeding, wound healing problems, graft  thrombosis and limb loss.  I have explained patency at 5 years is  approximately 60%.  Hopefully he will have usable greater saphenous vein  for his bypass although if the vein is not adequate, we would use a  prosthetic graft.  The patient  did have a vein patch in the right groin  and had a previous right femoral embolectomy back in September 2003.  With respect to his cardiac history, he has been evaluated by Dr. Excell Seltzer  and felt to be intermediate cardiac risk for his fem-pop bypass graft.  He was scheduled for a Cardiolite.  We do not yet have these results.  Assuming the Cardiolite is okay we tentatively plan his surgery for  03/30/2008.  He will take his last dose of Coumadin on 03/24/2008.   [name]. [name], M.D.  Electronically Signed   CSD/MEDQ  D:  03/21/2008  T:  03/22/2008  Job:  [id number]   cc:   Valetta Mole. Swords, MD  Veverly Fells. Excell Seltzer, MD

## 2010-10-22 NOTE — Op Note (Signed)
[name], [name]                ACCOUNT NO.:  [id number]   MEDICAL RECORD NO.:  [id number]          PATIENT TYPE:  AMB   LOCATION:  SDS                          FACILITY:  [facility]   PHYSICIAN:  Di Kindle. [name], M.D.DATE OF BIRTH:  08/04/1931   DATE OF PROCEDURE:  DATE OF DISCHARGE:                               OPERATIVE REPORT   PREOPERATIVE DIAGNOSIS:  Rest pain of the right foot with wound of the  right great toe.   POSTOPERATIVE DIAGNOSIS:  Rest pain of the right foot with wound of the  right great toe.   PROCEDURE:  1. Aortogram.  2. Bilateral iliac arteriogram and bilateral lower extremity runoff.  3. Selective catheterization of the right external iliac artery.  4. Ultrasound-guided access of the left common femoral artery.   TECHNIQUE:  The patient was taken to the PV lab and sedated with a  milligram of Versed and 25 mcg of fentanyl.  Both groins were prepped  and draped in usual sterile fashion.  After the skin was infiltrated  with 1% lidocaine and under ultrasound guidance, the left common femoral  artery was cannulated and a guidewire introduced into the infrarenal  aorta under fluoroscopic control.  A 5-French sheath was introduced over  the wire.  The dilator was removed.  Pigtail catheter was positioned at  the L1 vertebral body and flush aortogram obtained.  Catheter was then  repositioned above the aortic bifurcation and oblique iliac projections  were obtained.  Next, the pigtail catheter was exchanged for an IMA  catheter which was positioned into the proximal right common iliac  artery.  The wire was advanced down into the external iliac artery and  then the IMA catheter exchanged for an end-hole catheter.  Selective  right external iliac arteriogram was obtained with additional spot films  obtained of the right leg.  The catheter was then removed and additional  left lower extremity runoff films obtained through the left femoral  sheath.   FINDINGS:  There are single renal arteries bilaterally.  There is an  approximately 30% proximal right renal artery stenosis.  There is some  mild diffuse calcific disease at the infrarenal aorta and bilateral  common iliac arteries.  There is no focal stenosis in the infrarenal  aorta.  There is an approximately 15% stenosis in the distal left common  iliac artery.  There is a tight stenosis of the proximal right  hypogastric artery and then approximately 15% stenosis in the proximal  right external iliac artery.  The external iliac artery is otherwise  patent bilaterally.   On the right side, the common femoral and deep femoral artery are  patent.  The patient has had previous vein patch angioplasty which is  widely patent.  The superficial femoral artery is occluded at its  origin.  There is reconstitution of the popliteal artery at the level of  the knee with single-vessel runoff via the peroneal artery which  occludes above the ankle.  There is some reconstitution of the plantar  arch and the foot.  There is some mild disease in the tibioperoneal  trunk.  The anterior tibial and posterior tibial arteries are occluded  on the right.   On the left side, there is some moderate disease in the common femoral  artery at approximately 30% stenosis distally.  There is mild diffuse  disease of the distal superficial femoral artery and the popliteal  artery above the knee.  The below-knee popliteal artery is patent, and  there is single-vessel runoff on the left via the peroneal artery which  has moderate diffuse disease.   CONCLUSIONS:  1. A 30% right renal artery stenosis.  2. A 15% distal left common iliac artery stenosis.  3. A 15% proximal right external iliac artery stenosis.  4. Superficial femoral artery occlusion on the right with single-      vessel runoff via the peroneal artery.  5. Moderate distal superficial femoral artery and popliteal disease      with single-vessel  runoff via the peroneal artery.      Di Kindle. [name], M.D.  Electronically Signed     CSD/MEDQ  D:  01/10/2008  T:  01/10/2008  Job:  [id number]

## 2010-10-22 NOTE — Assessment & Plan Note (Signed)
OFFICE VISIT   [name], [name]  DOB:  [birth year]                                       05/09/2008  ZOXWR#:[id number]   I saw the patient in the office today for continued followup of his  right great toe wound.  He had presented with pain in the right great  toe and was found to have a superficial femoral artery occlusion and  severe tibial occlusive disease.  He was scheduled for a right fem-pop  bypass grafting.  However, his preoperative chest x-ray was abnormal and  he has undergone resection of a lung malignancy by Dr. Edwyna Shell.  We  postponed his fem-pop bypass graft.  I evaluated the foot while he was  in the hospital and the foot was stable so we elected to allow him to  recover from his chest surgery before proceeding with fem-pop bypass  grafting.  He returns for his first outpatient visit.   He continues to have pain in the right great toe.  This pain has been  tolerable.  He has had no fever or chills.  He is gradually resuming his  activities after his recent thoracic surgery.   PHYSICAL EXAMINATION:  On examination he has a palpable right femoral  pulse with no distal pulses.  He has a monophasic peroneal signal with  the Doppler.  The right great toe is slightly discolored but it has no  erythema and no drainage and the wound on the toenail bed is stable and  is dry.   Given that the toe remains stable I think it is safe to postpone his  surgery until he is feeling stronger.  He states that he really just  started ambulating today.  I will see him back on 06/06/2008 and if he  feels up for it at this point we can schedule his surgery.  If he feels  he needs more time to recover we will postpone it further.  He does know  to call if he develops any increasing pain in the foot, erythema or  drainage.   Di Kindle. [name], M.D.  Electronically Signed   CSD/MEDQ  D:  05/09/2008  T:  05/10/2008  Job:  [id number]   cc:   Ines Bloomer,  M.D.

## 2010-10-22 NOTE — H&P (Signed)
NAMELAN, MCNEILL                ACCOUNT NO.:  [id number]   MEDICAL RECORD NO.:  [id number]           PATIENT TYPE:   LOCATION:                                 FACILITY:   PHYSICIAN:  Ines Bloomer, M.D. DATE OF BIRTH:  [birth year]   DATE OF ADMISSION:  DATE OF DISCHARGE:                              HISTORY & PHYSICAL   CHIEF COMPLAINT:  Right lower lobe lung mass.   HISTORY OF PRESENT ILLNESS:  This 77 year old patient with a long  history of tobacco related diseases and status post ischemia to the  right foot with loss of his nail and was scheduled for a femoral-  popliteal bypass.  A quit smoking in 2003, but did smoke a pack and half  cigarettes for many times.  He had coronary bypass in 2003, by Dr.  Laneta Simmers for left main and severe two-vessel coronary artery disease.  His  preop workup for the femoral-popliteal bypass, chest x-ray revealed a  right lower lobe mass.  CT scan showed a 4.8 spiculated mass in the  right lower lobe.  CAT scan was positive for the right lower lobe  lesion, but in no other areas.  He also had an area that was positive in  his prostate gland where this has been followed by Dr. Vernie Ammons.  He had  increasing foot pain and I discussed this with Dr. Durwin Nora, who plans to  follow this up to his foot pain in the hospital.  We have stopped his  Coumadin 4 days before his surgery.   FAMILY HISTORY:  His weight has been stable.  He has had no hemoptysis,  fever, chills, or excessive sputum.   FAMILY HISTORY:  Noncontributory.   SOCIAL HISTORY:  He has 2 children.  Quit smoking in 2003.  He does not  drink alcohol on a regular basis.   REVIEW OF SYSTEM:  He is 146 pounds.  He is 5 feet 9 inches.  GENERAL:  His weight has been stable.  CARDIAC:  See history of present illness.  No recent angina.  PULMONARY:  See history of present illness.  GI:  He  had previous melena.  GU:  No kidney disease, dysuria, or frequent  urination.  See history of present  illness regarding prostate.  VASCULAR:  See history of present illness.  No DVT or TIAs.  NEUROLOGICAL:  He has had some dizziness.  No headaches, blackouts, or  seizures.  MUSCULOSKELETAL:  Arthritis.  PSYCHIATRIC:  No depression or  nervousness.  HEENT:  No changes in eye sight or hearing.  HEMATOLOGIC:  He has had some iron-deficient anemia.   MEDICATION:  1. Cardia XT 120 mg daily.  2. Coumadin 5 mg daily.  3. Prednisone 5 mg a day.  4. Lisinopril 20 mg daily.  5. Toprol 100 mg daily.  6. Flomax 0.4 mg daily.  7. Actonel 35 mg daily.  8. Calcium.  9. Vitamin D.  10.Folic acid.  11.Simvastatin 40 mg a day.  12.Oxycodone 5-500 for pain.  13.Ferrous sulfate.   ALLERGIES:  He has no allergies.   PHYSICAL  EXAMINATION:  General:  He is a well-developed [place] American  male, in no acute distress.  VITAL SIGNS:  His blood pressure is 100/70, and pulse 70.  HEAD:  Atraumatic.  EYES:  Pupils equal and reactive to light and accommodation.  Extraocular movements are normal.  EARS:  Tympanic membranes are intact.  NOSE:  There is no septal deviation.  THROAT:  There is no lesions.  NECK:  Supple without thyromegaly.  There is no supraclavicular axillary  adenopathy.  No jugular venous distention.  CHEST:  Clear to auscultation and percussion.  There is a median  sternotomy incision.  HEART:  Regular sinus rhythm.  No murmurs.  ABDOMEN:  Soft.  There is no splenomegaly.  EXTREMITIES:  Femoral pulses are 2+.  There is no pulse in the right or  left foot.  No edema.  No clubbing.  NEUROLOGIC:  He is oriented x3.  Sensory and motor intact.   Pulmonary function tests showed an FVC of 4.29, with an FEV-1 at 2.9 to  an effusion capacity of 78%.   IMPRESSION:  1. Right lower lobe mass, probable non-small cell lung cancer.  2. Chronic obstructive pulmonary disease.  3. History of tobacco abuse.  4. Status post coronary artery bypass.  5. Ischemia, right foot with a peripheral  vascular disease.  6. Hypertension.  7. Hypercholesterolemia.  8. Status post coronary artery bypass x4.   PLAN:  Right VATS, right lower lobectomy.      Ines Bloomer, M.D.  Electronically Signed     DPB/MEDQ  D:  04/24/2008  T:  04/25/2008  Job:  [id number]

## 2010-10-22 NOTE — Consult Note (Signed)
NAMENICOLAAS, SAVO                ACCOUNT NO.:  [id number]   MEDICAL RECORD NO.:  [id number]          PATIENT TYPE:  INP   LOCATION:  2305                         FACILITY:  [facility]   PHYSICIAN:  Di Kindle. [name], M.D.DATE OF BIRTH:  11-Oct-1931   DATE OF CONSULTATION:  04/22/2008  DATE OF DISCHARGE:                                 CONSULTATION   REASON FOR CONSULTATION:  Chronic right lower extremity ischemia.   HISTORY:  This is a pleasant 77 year old gentleman who I had been  following with chronic right lower extremity ischemia.  He had developed  some discoloration of his right great toe and also some rest pain in the  right foot.  He underwent an arteriogram, which showed a long segment  superficial femoral artery occlusion with reconstitution of the  popliteal artery at the level of the knee and single-vessel runoff via  the peroneal artery.  He was scheduled for right fem-pop bypass  grafting.  However, on his preoperative workup, he was found to have a  lung cancer.  A surgery was therefore postponed and he has undergone vas  lobectomy today by Dr. Edwyna Shell.  I was asked to evaluate his foot.   REVIEW OF SYSTEMS:  He has had no recent fever or chills.  His pain in  the foot has been stable.  He has had no drainage from the toe.   On physical examination, he is afebrile.  Blood pressure is 128/68.  He  has a palpable right femoral pulse.  I cannot palpate a popliteal pedal  pulse.  He does have a fairly brisk posterior tibial signal and anterior  tibial signal with a Doppler, although they are monophasic.  The  ischemic changes in the right foot are stable, get slight discoloration  of the right great toe with no open ulcers and no drainage.   Given that the right lower extremity is quite stable, I would favor  postponing his right lower extremity bypass until early December when he  slowly recovered from his long surgery.  If the foot worsened, however,  we could  certainly proceed the sooner.  Therefore from my standpoint, it  would be okay to resume his Coumadin when safe from a thoracic  standpoint.  I will follow peripherally.      Di Kindle. [name], M.D.  Electronically Signed     CSD/MEDQ  D:  04/27/2008  T:  04/28/2008  Job:  [id number]

## 2010-10-22 NOTE — Assessment & Plan Note (Signed)
OFFICE VISIT   RISHAWN, WALCK  DOB:  Aug 09, 1931                                        May 24, 2008  CHART #:  [id number]   HISTORY:  The patient is a 77 year old gentleman seen in routine office  followup following his right video-assisted thoracoscopic  minithoracotomy and the right lower lobectomy done on 04/26/2008, per  Dr. Edwyna Shell.  Pathology did reveal an invasive poorly differentiated  squamous cell carcinoma at 3.8 cm in greatest dimension.  Please see the  pathology report for full details.  Currently, he reports that he is  making steady progress.  He does have some discomfort.  He has only  minor shortness of breath.  He has some peripheral vascular occlusive  disease and is being treated to the vein by vascular surgeons.  He has  scheduled upcoming appointment with Dr. [name] in this regard and if he  requires surgery, Dr. Edwyna Shell does feel that he has made adequate  progress following his lung surgery to proceed.   Chest x-ray was obtained on today's date.  It reveals a resolution of  the right hydropneumothorax with only a small right pleural effusion  remaining.   PHYSICAL EXAMINATION:  VITAL SIGNS:  Blood pressure is 110/57,pulse 69,  respirations 16, and oxygen saturation is 100% on room air.  GENERAL:  A well-developed adult male in no acute distress.  PULMONARY:  Clear breath sounds, slightly diminished in the right base.  CARDIAC:  Regular rate and rhythm.  Incision is well healed without  evidence of infection.  There was a small chest tube suture still  remaining that I removed without difficulty.   ASSESSMENT:  The patient continues to make adequate recovery following  his cancer surgery.  He will follow up in our office in 6 weeks.  He is  encouraged to resume activities as tolerated.  He is also encouraged to continue his peripheral vascular evaluation and  treatment as per Dr. [name].   Rowe Clack, P.A.-C.   Sherryll Burger  D:  05/24/2008  T:  05/24/2008  Job:  [id number]   cc:   Ines Bloomer, M.D.

## 2010-10-22 NOTE — Assessment & Plan Note (Signed)
HEALTHCARE                            CARDIOLOGY OFFICE NOTE   NAME:[name], [name]                       MRN:          [id number]  DATE:06/16/2007                            DOB:          May 29, 1932    PRIMARY CARE PHYSICIAN:  Dr. Birdie Sons.   REASON FOR VISIT:  Cardiac follow-up.   HISTORY OF PRESENT ILLNESS:  [name] comes in for a 6 month visit.  He  overall is doing well without any active angina or limiting  breathlessness.  He furthermore has had no problems with recurrent  palpitations and has mild orthostatic dizziness which has been a chronic  problem.  He had no major falls and is tolerating Coumadin well.  His  electrocardiogram today is stable showing sinus bradycardia at 55 beats  per minute with an incomplete right bundle branch block pattern which is  old.  There are no other new changes.  I note that his statin was  changed from Lipitor to simvastatin and LDL control has been followed by  Dr. Cato Hill.   ALLERGIES:  NO KNOWN DRUG ALLERGIES.   PRESENT MEDICATIONS:  1. Coumadin as followed through the Coumadin clinic.  2. Toprol XL 100 mg p.o. daily.  3. Cartia XT 120 mg p.o. daily.  4. Lisinopril 20 mg p.o. daily.  5. Flomax 0.4 mg p.o. daily.  6. Aspirin 81 mg p.o. daily.  7. Folic acid 400 mg p.o. daily.  8. Calcium with vitamin D supplements.  9. Prednisone 5 mg one and a half tablets p.o. daily.  10.Vitamin D.  11.Simvastatin 40 mg p.o. daily.  12.He has sublingual nitroglycerin.   REVIEW OF SYSTEMS:  As described in the history present illness.   EXAMINATION:  The patient is in no acute distress.  Blood pressure  112/55, heart rate 55, weight is 180 pounds which is up 3 pounds from  his last visit.  HEENT:  Conjunctivae, lids normal, oropharynx clear.  NECK:  Supple, no elevated jugular venous pressure, soft right carotid  bruit, no thyromegaly.  LUNGS:  Clear without labored breathing.  CARDIAC:  Regular rate  and rhythm, soft basal systolic murmur, no S3 or  pericardial rub.  ABDOMEN:  Soft, no bruit, bowel sounds present.  EXTREMITIES:  Exhibit no significant pitting edema.  SKIN:  Warm and dry.  MUSCULOSKELETAL:  No kyphosis noted.  NEUROPSYCHIATRIC:  The patient is alert and oriented x3.   IMPRESSION AND RECOMMENDATION:  1. Multivessel cardiovascular disease status post coronary artery      bypass grafting.  The patient is stable symptomatically and we will      plan to continue medical therapy at this point.  2. History of atrial flutter, status post radiofrequency ablation.  He      has had no palpitations and is in sinus rhythm today.  Plan will be      to continue Coumadin, Toprol XL and Cartia XT.  3. Known carotid artery disease.  The patient will be due for a follow-      up carotid ultrasound in August.  4. Hyperlipidemia, tolerating  simvastatin.  LDL should be around 70.      This is being followed closely by Dr. Cato Hill.     Jonelle Sidle, MD  Electronically Signed    SGM/MedQ  DD: 06/16/2007  DT: 06/16/2007  Job #: [id number]   cc:   Valetta Mole. Swords, MD

## 2010-10-22 NOTE — Procedures (Signed)
BYPASS GRAFT EVALUATION   INDICATION:  Followup right femoral to popliteal bypass graft.   HISTORY:  Diabetes:  No.  Cardiac:  No.  Hypertension:  Yes.  Smoking:  Previous.  Previous Surgery:  Right femoral to popliteal artery bypass graft  06/21/2008 by Dr. [name].   SINGLE LEVEL ARTERIAL EXAM                               RIGHT              LEFT  Brachial:                    142                121  Anterior tibial:             144                126  Posterior tibial:            Noncompressible    93  Peroneal:  Ankle/brachial index:        1.01               0.89   PREVIOUS ABI:  Date:  07/06/2009  RIGHT:  1.28  LEFT:  1.18   LOWER EXTREMITY BYPASS GRAFT DUPLEX EXAM:   DUPLEX:  Doppler arterial waveforms are monophasic proximal to, within  and distal to right lower extremity bypass graft.   IMPRESSION:  1. Patent right femoral-popliteal artery bypass graft.  2. Ankle brachial indices invalid with known calcified vessels.   ___________________________________________  Di Kindle. [name], M.D.   EM/MEDQ  D:  03/07/2010  T:  03/07/2010  Job:  [id number]

## 2010-10-22 NOTE — Assessment & Plan Note (Signed)
OFFICE VISIT   ABIEL, ANTRIM  DOB:  [birth year]                                       01/04/2008  FIEPP#:[id number]   I saw the patient in the office today with a chief complaint of right  great toe pain which began approximately 2 months ago and has persisted.  He has had no real change in his symptoms over the last few months.  He  has had a small amount of drainage from under the toenail bed and  persistent pain in the toe.  He has mild rest pain in the right foot and  bilateral lower extremity calf claudication, which has been stable over  the last year.  He has had no fever or chills.   PAST MEDICAL HISTORY:  Significant for coronary artery disease.  He went  coronary revascularization approximately 5 or 6 years ago with vein  taken from the left leg.  He denies any history of diabetes.  He does  have significant arthritis and is on prednisone for this.   PAST SURGICAL HISTORY:  He has undergone a previous right femoral  embolectomy and vein patch angioplasty of the right common femoral  artery.  He has been on Coumadin since that time.   Review of systems and medications are documented on the medical history  form in his chart.   PHYSICAL EXAMINATION:  This is a pleasant 77 year old gentleman who  appears his stated age.  His blood pressure 96/54, heart rate is 65.  I  did not detect any carotid bruits.  Lungs are clear bilaterally to  auscultation.  Cardiac exam he has a regular rate rhythm.  Abdomen:  Soft, nontender.  He has palpable but slightly diminished femoral  pulses.  I cannot palpate popliteal or pedal pulses on either side.  He  has monophasic Doppler signals in both feet.  He has some dark  discoloration to the right great toe with no significant erythema  currently.  He has no open ulcers on either foot.   He has had previous ABIs but his arteries are calcified and  noncompressible.  He is getting dampened monophasic flow in both  feet.   Given the persistent pain in the right great toe with some discoloration  of the toe and evidence of significant infrarenal arterial occlusive  disease, I have recommended we proceed with arteriography to see what  options he might have for revascularization.  If he does have a proximal  iliac stenosis, this could potentially be addressed with balloon and  stenting.  Will make further recommendations given the results of his  arteriogram.  We have discussed the indications for the procedure and  the potential complications including but not limited to bleeding,  arterial injury, arterial thrombosis and dissection.  We will stop his  Coumadin 4 days prior to the procedure.   Di Kindle. [name], M.D.  Electronically Signed   CSD/MEDQ  D:  01/04/2008  T:  01/04/2008  Job:  [id number]   cc:   Valetta Mole. Swords, MD

## 2010-10-22 NOTE — Assessment & Plan Note (Signed)
[facility]                            CARDIOLOGY OFFICE NOTE   NAME:[name], [name]                       MRN:          [id number]  DATE:01/01/2007                            DOB:          13-Apr-1932    PRIMARY CARE PHYSICIAN:  Dr. Birdie Sons   REASON FOR VISIT:  Routine cardiac followup.   HISTORY OF PRESENT ILLNESS:  [name] is a pleasant 77 year old male,  former patient of Dr. Corinda Gubler, with a history of multivessel coronary  artery disease, status post previous coronary artery bypass grafting  with coronary anatomy outlined in Dr. Blossom Hoops most recent note from  February of this year.  [name] underwent a cardiac catheterization in  December 2006, following presentation with syncope and rapid atrial  flutter which ultimately required an ablation.  He was noted to have  severe multivessel disease with 40% proximal left anterior descending  stenosis, occluded proximal circumflex, and diffusely diseased right  coronary artery with 90% distal stenosis and competing distal flow.  The  vein graft distal right coronary artery was patent as was the sequential  saphenous vein graft to the marginal and posterior lateral branch of the  circumflex.  The LIMA to the left anterior descending was atrophic.  Ejection fraction was 50% with inferior basal hypokinesis.  Symptomatically [name] is not having any significant angina or  breathlessness.  He does have mild dizziness on standing, which sounds  to be orthostatic.  He has described this before based on chart review.  He has had no frank syncope or palpitations.  His electrocardiogram is  stable, showing sinus rhythm with an incomplete left bundle branch  block.  He continues to take Coumadin and is followed through our  [facility]madin Clinic.  He has had no bleeding problems.  He reports that his  lipids have been followed by Dr. Cato Mulligan and that he is due to see him  back in August.  Last carotid  duplex was in January of this year,  revealing tortuous distal internal carotid arteries with 40% to 59%  bilateral stenosis.   ALLERGIES:  No known drug allergies.   PRESENT MEDICATIONS:  1. Coumadin as directed by the Coumadin Clinic.  2. Toprol XL 100 mg p.o. daily.  3. Cardia XT 120 mg p.o. daily.  4. Lisinopril 20 mg p.o. daily.  5. Flomax 0.04 mg p.o. daily.  6. Aspirin 81 mg p.o. daily.  7. Folic acid 400 mg p.o. daily.  8. Actonel 35 mg weekly.  9. Calcium and vitamin D supplements.  10.Pravastatin 20 mg p.o. daily.  11.Prednisone 7.5 mg p.o. daily.  12.Sublingual nitroglycerin p.r.n.   REVIEW OF SYSTEMS:  As described in history of present illness,  otherwise negative.   EXAMINATION:  Blood pressure  123/58, heart rate 63, weight is 177  pounds, down from 184.  The patient is comfortable and in no acute  distress.  NECK:  Reveals a right carotid bruit, no thyromegaly  noted.  LUNGS:  Clear without labored breathing.  CARDIAC:  Reveals a regular rate and rhythm, soft systolic murmur and  preserved S2, no S3, gallop or pericardial rub.  ABDOMEN:  Soft, no bruit.  EXTREMITIES:  Exhibit no pitting edema.  SKIN:  Warm and dry.   IMPRESSION/RECOMMENDATION:  1. Multivessel coronary artery disease, status post previous coronary      artery bypass grafting, with bypass graft anatomy as outlined above      and low normal ejection fraction.  Symptomatically [name] is      stable.  We will plan to continue his present regimen.  His      electrocardiogram shows no significant changes today.  2. History of atrial flutter, status post radiofrequency ablation.      The patient also has a history of atrial fibrillation and continues      on Coumadin as followed up through our Coumadin Clinic.  He has had      previous transient ischemic attack and peripheral embolus.  3. Hyperlipidemia, on statin therapy.  Ideally his LDL cholesterol      should be around 70.  This has been  followed by Dr. Cato Mulligan.     Jonelle Sidle, MD  Electronically Signed    SGM/MedQ  DD: 01/01/2007  DT: 01/01/2007  Job #: [id number]   cc:   Valetta Mole. Swords, MD

## 2010-10-22 NOTE — Assessment & Plan Note (Signed)
[facility]                            CARDIOLOGY OFFICE NOTE   NAME:[name], [name]                       MRN:          [id number]  DATE:02/11/2008                            DOB:          21-Aug-1931    REASON FOR VISIT:  Preoperative cardiovascular evaluation in the patient  with coronary artery disease and previous CABG.   [name] is a very nice 77 year old gentleman who has previously been  cared for by Dr. Diona Browner.  I will be assuming his cardiac care from  here forward.  He has a history of multivessel coronary artery disease,  status post CABG as well as atrial flutter, status post radiofrequency  ablation.  He has developed progressive ischemic disease involving the  right leg.  He has bilateral calf claudication and rest pain in the  right foot.  He is planned for upcoming fem-pop bypass on the right.   [name] has no chest pain, dyspnea, orthopnea, or PND.  He is inactive  because of his leg problems.  He does not participate in any regular  exercise.  He denies palpitations, lightheadedness, or syncope.  He has  had no recent changes in his cardiac medications.  He has had no  previous problems with general anesthesia.   MEDICATIONS:  1. Simvastatin 40 mg daily.  2. Iron 325 mg twice daily.  3. Coumadin as directed.  4. Toprol-XL 100 mg daily.  5. Cartia XT 120 mg daily.  6. Lisinopril 20 mg daily.  7. Flomax 0.4 mg daily.  8. Aspirin 81 mg daily.  9. Folic acid 400 mg daily.  10.Prednisone 2.5 mg daily.  11.Vitamin D daily.   ALLERGIES:  NKDA.   PHYSICAL EXAMINATION:  GENERAL:  On exam, he is alert and oriented in no  acute distress.  VITAL SIGNS:  Weight is 176 pounds, blood pressure 116/54, heart rate  56, respiratory rate 16.  HEENT:  Normal.  NECK:  Normal.  Carotid upstrokes with bilateral bruits.  JVP normal.  LUNGS:  Clear bilaterally.  HEART:  Regular rate and rhythm.  No murmurs or gallops.  ABDOMEN:  Soft,  nontender.  No organomegaly.  EXTREMITIES:  There is no clubbing, cyanosis, or edema.  Pedal pulses  are not palpable.  The right great toe has discoloration under the nail.  There is no erythema or ulcers.   EKG shows sinus bradycardia with incomplete right bundle-branch block  and nonspecific ST-segment T-wave abnormality.   ASSESSMENT:  [name] is at intermediate cardiac risk for upcoming  femoral-popliteal bypass.  He appears to be very stable from a cardiac  standpoint.  However, he has a very low functional capacity because of  his leg problems.  Since he is status post bypass surgery and it has  been 3 years since any assessment of his coronary status, I think we  should do an adenosine Myoview stress scan prior to surgery.  As long as  a scan has a low risk result, he can proceed without any further cardiac  testing.  He should be continued on his Toprol-XL throughout the  perioperative period.  We will be in touch with Dr. Adele Dan office as  soon as the stress test is completed, so that he can have final  clearance for surgery.  If he develops any problems in the perioperative  period, I would be more than happy to see him in the hospital.     Veverly Fells. Excell Seltzer, MD  Electronically Signed    MDC/MedQ  DD: 02/11/2008  DT: 02/12/2008  Job #: [id number]   cc:   Valetta Mole. Swords, MD

## 2010-10-22 NOTE — Assessment & Plan Note (Signed)
OFFICE VISIT   TRAVANTI, MCMANUS  DOB:  01/30/1932                                        November 20, 2009  CHART #:  [id number]   The patient returns today for followup.  His CT scan showed no evidence  of recurrence of his cancer.  He is now almost 18 months since his  surgery.  He has been followed by Dr. Clelia Croft.  I will let Dr. Clelia Croft to  follow him now with 6 months CT, and we will see him back again if he  has any future problems.   Ines Bloomer, M.D.  Electronically Signed   DPB/MEDQ  D:  11/20/2009  T:  11/21/2009  Job:  [id number]   cc:   Valetta Mole. Swords, MD  Di Kindle. [name], M.D.  Blenda Nicely. [employer]

## 2010-10-23 ENCOUNTER — Telehealth: Payer: Self-pay | Admitting: Pharmacist

## 2010-10-23 NOTE — Telephone Encounter (Signed)
Pt called.  Having cataract removed and wanted to know if he should hold Coumadin.  Told pt okay to continue to take Coumadin for that procedure.  He is also having a prostate biopsy on 6/19 and will need to hold Coumadin.  Will send to Dr. Excell Seltzer for clearance.

## 2010-10-23 NOTE — Telephone Encounter (Signed)
Ok to hold warfarin 5 days prior to prostate biopsy and resume when ok with urologist.

## 2010-10-25 NOTE — Op Note (Signed)
   [name], [name]                            ACCOUNT NO.:  [id number]   MEDICAL RECORD NO.:  [id number]                   PATIENT TYPE:  INP   LOCATION:  2399                                 FACILITY:  [facility]   PHYSICIAN:  Sigmund I. Patsi Sears, M.D.         DATE OF BIRTH:  1931-08-25   DATE OF PROCEDURE:  01/31/2002  DATE OF DISCHARGE:                                 OPERATIVE REPORT   PREOPERATIVE DIAGNOSES:  Obstructive urethral stricture.   POSTOPERATIVE DIAGNOSES:  Obstructive urethral stricture.   OPERATION PERFORMED:  Intraoperative urethral dilation and placement of  Foley catheter.   SURGEON:  Sigmund I. Patsi Sears, M.D.   ANESTHESIA:  General.   DESCRIPTION OF PROCEDURE:  With the patient under general anesthesia, and  prior urethral manipulation with some bleeding, it was elected to flexible  cystoscope the patient.  However, the complete flexible cystoscope set was  not available and therefore, the patient underwent urethral dilation, and  placement of the cardiac Foley catheter.  There was some bleeding noted, and  a definite pop at the level of the prostatic urethra and some bleeding  noted.  The patient will be evaluated postoperatively to be sure he is  stable.  He had received IV antibiotics for cardiac surgery.                                                 Sigmund I. Patsi Sears, M.D.    SIT/MEDQ  D:  01/31/2002  T:  02/02/2002  Job:  [id number]   cc:   Alleen Borne, M.D.

## 2010-10-25 NOTE — Consult Note (Signed)
[name], [name]                            ACCOUNT NO.:  [id number]   MEDICAL RECORD NO.:  [id number]                   PATIENT TYPE:  OIB   LOCATION:  2899                                 FACILITY:  [facility]   PHYSICIAN:  Alleen Borne, M.D.               DATE OF BIRTH:  1931/09/28   DATE OF CONSULTATION:  01/28/2002  DATE OF DISCHARGE:                       CARDIOVASCULAR/THORACIC CONSULTATION   REASON FOR CONSULTATION:  Left main and severe three-vessel coronary artery  disease.   HISTORY OF PRESENT ILLNESS:  This patient is a 77 year old gentleman with a  history of coronary disease, status post inferior myocardial infarction in  1994 treated with PTCA of the right coronary artery.  At that time he had 50-  60% left circumflex, 50-60% diagonal, and normal LAD.  Ejection fraction was  50-55%.  He has also had a history of significant lower extremity vascular  disease that has been treated medically.  He was lost to follow-up until he  saw Bruce H. Swords, M.D., recently.  He underwent a stress Cardiolite exam  on 01/10/02, which showed inferolateral infarct and mild to moderate peri-  infarct ischemia.  The test was stopped due to fatigue and ST changes.  Ejection fraction was 32%.  He subsequently underwent cardiac  catheterization today, which showed an 80% ostial left main stenosis with  catheter dampening.  The first obtuse marginal had 70% stenosis.  The second  marginal had 90% stenosis.  The right coronary artery had a high-grade  ostial right coronary artery stenosis with catheter dampening.  There was  about 70% mid-right coronary stenosis.   The patient denies having any chest pain, any pressure.  He has not had any  pain in his shoulders, neck, or arms.  He denies shortness of breath.  He  has occasional dyspnea with heavy exertion.   REVIEW OF SYSTEMS:  CONSTITUTIONAL:  He denies fever or chills.  He has had  no recent weight changes.  EYES:  Negative.  ENT:   Negative.  ENDOCRINE:  He  denies diabetes and hypothyroidism.  CARDIOVASCULAR:  As above.  He denies  PND and orthopnea.  He has had no peripheral edema.  RESPIRATORY:  He denies  cough and sputum production.  GASTROINTESTINAL:  He has had no nausea or  vomiting.  He denies dysphagia.  He has had no melena or bright red blood  per rectum.  GENITOURINARY:  He denies dysuria and hematuria.  NEUROLOGIC:  He has had no focal weakness or numbness.  He denies dizziness and syncope.  PERIPHERAL VASCULAR:  He does have claudication in his right leg with  tightness in his right calf.   PAST MEDICAL HISTORY:  Significant for coronary disease as mentioned above.  He has a history of peripheral vascular disease treated by Richard A.  Alanda Amass, M.D., in the past.  He has a history of hyperlipidemia.  He has  never had  any surgery.   FAMILY HISTORY:  His mother died of TB, and his father was murdered.  He had  one brother who died of TB.  Two brothers died of questionable etiology.   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  He has four  children.  He smokes one to two packs of cigarettes per day for the past 50  years.  He drinks occasional alcohol.   PHYSICAL EXAMINATION:  VITAL SIGNS:  His blood pressure is 120/72 and his  pulse is 72 and regular.  Respiratory rate is 16 and unlabored.  GENERAL:  He is a thin, well-developed black male in no distress.  HEENT:  Normocephalic and atraumatic.  The pupils are equal and reactive to  light and  accommodation.  The extraocular muscles are intact.  His throat  is clear.  NECK:  Normal carotid pulses bilaterally.  There are no bruits.  There is no  adenopathy or thyromegaly.  CARDIAC:  Regular rate and rhythm with a grade 1/6 systolic murmur along the  left sternal border.  CHEST:  His lung exam is clear.  ABDOMEN:  Active bowel sounds.  His abdomen is soft and nontender.  There  are no palpable masses or organomegaly.   EXTREMITIES:  No peripheral edema.  He does have loss of hair in the lower  legs with thin skin.  Pedal pulses are not palpable.  NEUROLOGIC:  Alert and oriented x3.  Motor and sensory exam are grossly  normal.   LABORATORY DATA:  Normal coagulation profile.  Electrolytes are normal with  a creatinine of 1.3 and a BUN of 31.  Cholesterol was 191 with an HDL of 59  and an LDL of 123, triglyceride level of 44.  Hemoglobin is 14.5 with  platelet count of 239,000.   IMPRESSION:  This patient has high-grade left main and ostial right coronary  stenosis with high-grade left circumflex stenosis.  I agree that coronary  artery bypass graft surgery is the best treatment to prevent further  ischemia, infarction, and death.  I have discussed the operative procedure  with the patient and his wife, including the alternatives, benefits, and  risks, including bleeding, possible blood transfusion, infection, stroke,  myocardial infarction, and death.  They understand and agree to proceed with  surgery.  We will plan to do this on Monday, 01/31/02.                                               Alleen Borne, M.D.    BKB/MEDQ  D:  01/28/2002  T:  01/28/2002  Job:  [id number]   cc:   Salvadore Farber, MD [facility]  [address] [place], [place] [address]  Fax: 1

## 2010-10-25 NOTE — Telephone Encounter (Signed)
Spoke with pt.  He is aware that it is okay to hold Coumadin prior to prostate biopsy.

## 2010-10-25 NOTE — Discharge Summary (Signed)
[name], [name]                ACCOUNT NO.:  [id number]   MEDICAL RECORD NO.:  [id number]          PATIENT TYPE:  INP   LOCATION:  3710                         FACILITY:  [facility]   PHYSICIAN:  Doylene Canning. Ladona Ridgel, M.D.  DATE OF BIRTH:  [birth year]   DATE OF ADMISSION:  05/07/2005  DATE OF DISCHARGE:  05/14/2005                                 DISCHARGE SUMMARY   REFERRING PHYSICIAN:  Areatha Keas, M.D.   SUMMARY OF HISTORY:  Mr. Fortner is a 77 year old male who was transferred to  the ER from Dr. Renaldo Reel office after he had clearance of syncopal episode. He  was being evaluated for joint discomfort. After the exam in Dr. Renaldo Reel  office, when he arose from the table and walked to the lobby, he experienced  weakness in his legs and subsequently fell to the floor damaging his  dentures and upper lip.  He denied any associated chest discomfort or  shortness of breath. He describes a fall or a near fall while working  outside several weeks ago which he felt may have been somewhat similar.   PAST MEDICAL HISTORY:  1.  Peripheral vascular disease with PTA to the right iliac and superficial      femoral artery in 2003.  2.  TIA times two in 2003.  3.  Bypass surgery in 2003 with an EF of 30% prior to revascularization, but      40% post procedure.  4.  Atrial fibrillation postoperatively, maintained on Coumadin.  5.  Remote tobacco use.   LABORATORY DATA:  Chest x-ray shows cardiomegaly with pulmonary vascular  congestion, mild left mid lung subsegmental atelectasis airspace disease.  Admission weight 171.2, admission H&H 11.5 and 34.5, normal indices,  platelet count 371,000, WBCs 6.8. Prior to discharge H&h was 11.1 and 33.0,  normal indices, platelets 251,000, WBCs 8.5.  Admission PT was 37.0 with an  INR of 3.7 and a PTT of 43. Prior to discharge on the 6th INR was 1.5, PT  18.4. Admission sodium was 137, potassium 4.0, BUN 15, creatinine 1.2,  glucose 126.  LFTs were within normal limits.  Albumin and total protein were  low at 3.0 and 5.9 respectively. Subsequent chemistry on the 5th showed  sodium of 135, potassium 4.2, BUN 12, creatinine 1.5. CK-MB and  troponin  times three were negative for myocardial infarction. Fasting lipid showed a  total cholesterol of 226, triglycerides 65, HDL 82, LDL 131. TSH 0.467.  Iron was 80, TIBC 273, percent saturation 29%, UIBC 193.   EKG on admission showed atrial flutter, with variable block at a rate of 89.  Subsequent EKGs prior to discharge showed normal sinus rhythm.   HOSPITAL COURSE:  Mr. Joung was admitted to [facility] by Dr.  Dietrich Pates. On the 9th he ruled out for myocardial infarction and ventricular  rate was controlled. However, he remained in atrial flutter. EP consult was  sought with Dr. Graciela Husbands. Dr. Graciela Husbands recommended radiofrequency ablation given  his atrial flutter with rapid ventricular rate. The patient did undergo a  cardiac catheterization to exclude for ischemia  in regards to a syncopal  episode. He also stated that the patient should not drive until seen by  physician. His Coumadin was placed on hold in anticipation of the cardiac  catheterization and possible ablation. An echocardiogram was performed which  showed an EF of 55% with inferior hypokinesis, mild increased aortic valve  thickness, and mild right atrial dilatation. On May 12, 2005, Dr. Juanda Chance  performed cardiac catheterization showing a native three-vessel coronary  artery disease, LIMA to the LAD was patent, but atrophic, saphenous vein  graft to the OM-1 and distal PL was patent. Saphenous vein graft to the  distal RCA was patent. Inferobasilar hypokinesis with an EF of 50% and  irregularities in the aorta and a 50% left iliac. It was felt that he did  not have any source of ischemia. Dr. Ladona Ridgel performed radiofrequency  ablation on May 12, 2005, without difficulty. Post procedure  catheterization sites were intact. He maintained a  normal sinus rhythm post  ablation. His Coumadin was restarted. Case manager assisted with discharge  needs. By May 14, 2005, he was ambulating without difficulty and Dr.  Ladona Ridgel felt that he could be discharged home.   PROCEDURE PERFORMED:  Cardiac catheterization.   DISCHARGE DIAGNOSES:  1.  Syncope.  2.  Atrial flutter with rapid ventricular rate, status post atrial flutter      ablation and maintaining normal sinus rhythm at the time of discharge.  3.  Hyperlipidemia.  4.  Normocytic anemia.  5.  History as listed above.   DISPOSITION:  The patient is discharged home. His Altace was decreased to 10  mg daily. He was asked to continue aspirin 81 mg daily, Toprol-XL 100 mg  daily, Lipitor 80 mg q.h.s., Flomax 0.4 mg daily, prednisone 15 mg daily,  Cartia XT 120 mg daily, Coumadin 5 mg daily except 2.5 mg on Monday and  Wednesday. Nitroglycerin as needed for chest discomfort. He will follow up  with Dr. Ladona Ridgel on June 18, 2005, at 10:45. He will have a PT-INR checked  in our office tomorrow, May 15, 2005, at 12:45 p.m. He is advised to  maintain a low-salt, low-fat, and low-cholesterol diet. He is advised no  driving until further evaluated by Dr. Ladona Ridgel. He was restricted in regards  to lifting, sexual activity, or heavy exertion for two days. If he had any  problems with his catheterization site he was asked to call the office.   CONSIDERATION AT FOLLOWUP:  Add Zetia to his Lipitor.      Joellyn Rued, P.A. [facility]    ______________________________  Doylene Canning. Ladona Ridgel, M.D.    EW/MEDQ  D:  05/14/2005  T:  05/14/2005  Job:  [id number]   cc:   Areatha Keas, M.D.  Fax: [phone]   Valetta Mole. Swords, M.D. [name]  [address] [place]  [place] [address]   E. Graceann Congress, M.D.  [address]  [address]  [place]  [place] [address]   Doylene Canning. Ladona Ridgel, M.D.  [address]  [address]  [place]  [place] [address]

## 2010-10-25 NOTE — Assessment & Plan Note (Signed)
[facility]                            CARDIOLOGY OFFICE NOTE   NAME:Wulf, DIAR BERKEL                       MRN:          [id number]  DATE:07/21/2006                            DOB:          02-Aug-1931    Mr. 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 Laneta Simmers.   The patient has a history of hypertension, hyperlipidemia, paroxysmal  atrial fibrillation and flutter.  He had previous transient ischemic  attack and arterial occlusion in the right lower extremity.   He was admitted last year with syncope and found to be in atrial flutter  with rapid ventricular response, and underwent catheterization and radio  frequency ablation by Dr. Ladona Ridgel.  He has had no recurrent syncope or  palpitations.  The cath at that time revealed total occlusion of the  proximal circ, 40% narrowing in the proximal LAD, diffuse disease in the  distal RCA, and 90% distal stenosis.  Patent vein graft to the RCA,  patent sequential vein graft in the marginal and posterior lateral, and  atrophic IMA to the LAD.  EF was 50%.  Aortogram revealed 8 renal  arteries, no major obstruction.   The patient is feeling well at this time.  Having no chest pain,  shortness of breath, or palpitations.   MEDICATIONS:  1. Include Coumadin.  2. Aspirin 81 because of his history of paroxysmal atrial fibrillation      and TIA, and peripheral embolus.  3. Cartia XT 120.  4. Lisinopril 20.  5. Flomax.  6. Prednisone 7.5.  7. Actonel.  8. UroXatral.  9. Pravastatin 20.   The patient is being followed by Dr. Birdie Sons.   PHYSICAL EXAM:  Blood pressure 115/64, pulse 64, normal sinus rhythm.  GENERAL APPEARANCE:  Normal.  JVPs not elevated.  Carotid pulses are palpable and equal with bilateral  bruits.  CARDIAC:  Reveals 1/6 short systolic ejection murmur  at the aortic area.  ABDOMEN:  Unremarkable.  EXTREMITIES:  Normal.   Most recent carotid Dopplers June 17, 2006 reveal stable heterogenous  plaque of 40-59%.   Most recent echo revealed an LV of 55% in 2006.   IMPRESSION AND DIAGNOSIS:  As above.  I have suggested the patient  continue on the same therapy.  He will follow up with Dr. Cato Mulligan, and I  suggested he see Dr. Simona Huh in 4 to 6 months.   His EKG today reveals normal sinus rhythm with incomplete right bundle  branch block and nonspecific ST changes.     Cecil Cranker, MD, [facility]  Electronically Signed    EJL/MedQ  DD: 07/21/2006  DT: 07/21/2006  Job #: [id number]   cc:   Valetta Mole. Swords, MD

## 2010-10-25 NOTE — H&P (Signed)
NAMELATRAIL, POUNDERS NO.:  [id number]   MEDICAL RECORD NO.:  [id number]          PATIENT TYPE:  EMS   LOCATION:  MAJO                         FACILITY:  [facility]   PHYSICIAN:   Bing, M.D. LHCDATE OF BIRTH:  [birth year]   DATE OF ADMISSION:  05/07/2005  DATE OF DISCHARGE:                                HISTORY & PHYSICAL   REFERRING PHYSICIAN:  Areatha Keas, M.D.   PRIMARY CARE PHYSICIAN:  Valetta Mole. Swords, M.D.   PRIMARY CARDIOLOGIST:  Cecil Cranker, M.D.   HISTORY OF PRESENT ILLNESS:  This 77 year old gentleman was referred to the  emergency department at [facility] after a syncopal episode in the  office of Dr. Chase Picket. Mr. Knueppel has a history of coronary artery  disease, having undergone CABG surgery in 2003. Ejection fraction was less  than 30% prior to revascularization, but in excess of 40% subsequent to the  procedure. The patient also has peripheral vascular disease and experiences  intermittent claudication that resolves promptly with rest. Paroxysmal  atrial fibrillation developed after surgery, and he has been maintained on  warfarin since that time without documented recurrence as best I can  determine. There is history of TIA.   The patient was being evaluated by Dr. Phylliss Bob in the office today for joint  pain. After the exam, he arose from the table and walked to the lobby where  he experienced weakness in his legs and subsequently fell to the floor. He  damaged his dentures and injured his upper lip, but otherwise notes no  residual discomfort. There was no chest pain or dyspnea. The patient has  maintained a good level of activity without problems. He experienced a fall  or a near fall while working outside some weeks ago, which may have been a  similar episode.   Cardiac history dates to 1994 when the patient first suffered a myocardial  infarction and underwent PTCA. He underwent a a pharmacologic stress Myoview  study in  March 2006 with inferolateral infarction and a minimal peri-  infarction ischemia. He also is known to have cerebrovascular disease with  moderate bilateral internal carotid artery stenoses.   Past Medical History also includes a percutaneous intervention for right  iliac and superficial femoral artery occlusion in 2003. The patient suffered  TIAs on two occasions in 2003. The patient reports no allergies.   MEDICATIONS:  Listed in the chart.   SOCIAL HISTORY:  Married and lives in [place]; [place] history of  cigarette smoking that was discontinued at the time of his cardiac surgery.  No excessive alcohol and none at all for the past 2 months.   FAMILY HISTORY:  Notable for tuberculosis in his mother and traumatic death  of his father at a young age.   REVIEW OF SYSTEMS:  Is notable for recent upper respiratory tract infection.  All other systems reviewed and are negative.   PHYSICAL EXAMINATION:  GENERAL:  Pleasant gentleman in no acute distress.  VITAL SIGNS: Temperature is 98.5, heart rate 70 and irregular, respirations  20, blood pressure 120/70 without significant orthostatic  change.  HEENT: No erythema of the upper lip with a slight laceration that is not  currently bleeding; edentulous; normal oral mucosa.  NECK:  No jugular venous distension; normal carotid upstrokes without  bruits.  ENDOCRINE: No thyromegaly.  HEMATOPOIETIC: No adenopathy.  SKIN:  No significant lesions.  LUNGS: Clear.  CARDIAC:  Irregular rhythm; soft apical systolic murmur.  ABDOMEN:  Soft and nontender; no masses; no organomegaly.  EXTREMITIES: Distal pulses are decreased, particularly on the left; 1/2+  edema.  NEUROMUSCULAR: Normal cranial nerves; symmetric strength and tone.  MUSCULOSKELETAL: No joint deformities.  RECTAL:  Brow, hemoccult-negative stool.   EKG: Atrial flutter with variable AV conduction and a controlled rate;  probable prior inferior myocardial infarction; nonspecific  T-wave  abnormality. Other laboratory studies and chest x-ray are pending.   IMPRESSION:  Mr. Pomplun presents with syncope and newly diagnosed atrial  flutter. It is likely that these two facts are related, but at the present  time he is hemodynamically stable with good rate control in flutter. It may  be that this arrhythmia is paroxysmal, and that he is experiencing pauses at  the time of conversion to sinus rhythm. I doubt whether conduction is  excessively rapid and is causing hemodynamic compromise. With known coronary  disease and LV dysfunction, the possibility of ventricular arrhythmias also  must be entertained. There is no history to support the diagnosis of acute  coronary syndrome. Mr. Guay will be admitted for telemetry and observation.  Serial cardiac markers will be obtained, but the expectation is these will  be normal. An echocardiogram will be obtained to evaluate left ventricular  systolic function. If he does not otherwise have the indication for AICD  implantation, consideration will be given to pacemaker implantation. The QRS  width is normal, and there is no history of congestive heart failure;  accordingly, biventricular pacing is not a consideration. An  electrophysiology consultation  will be obtained to clarify these issues. Warfarin will be continued, since  the capital EP service prefers full anticoagulation at the time of pacing,  if required. His other medications appear appropriate. Lipid profile will be  assessed.      Callaway Bing, M.D. [facility]  Electronically Signed     RR/MEDQ  D:  05/07/2005  T:  05/07/2005  Job:  [id number]   cc:   Areatha Keas, M.D.  Fax: [phone]   Valetta Mole. Swords, M.D. [facility]  [address] [place]  [place] [address]   E. Graceann Congress, M.D.  [address]  [address]  [place]  [place] [address]

## 2010-10-25 NOTE — Op Note (Signed)
NAMEHAYVEN, [name]                            ACCOUNT NO.:  [id number]   MEDICAL RECORD NO.:  [id number]                   PATIENT TYPE:  INP   LOCATION:  2027                                 FACILITY:  [facility]   PHYSICIAN:  Di Kindle. [name], M.D.        DATE OF BIRTH:  06-22-1931   DATE OF PROCEDURE:  02/15/2002  DATE OF DISCHARGE:                                 OPERATIVE REPORT   PROCEDURE:  1. Arch aortogram.  2. Right carotid arteriogram.  3. Left carotid arteriogram.  4. Left subclavian arteriogram.  5. Aortogram and bilateral iliac arteriogram.  6. Right lower extremity runoff.   SURGEON:  Di Kindle. [name], M.D.   ANESTHESIA:  Local.   INDICATIONS FOR PROCEDURE:  This is a 77 year old gentleman who had  presented with an acutely ischemic right lower extremity. He underwent an  arteriogram which showed an occlusion of the iliac system on the right and  he underwent a right femoral embolectomy and a large amount of fresh clot  was retrieved from the right iliac system. Good inflow was established. He  was brought back for diagnostic arteriography in order to determine if there  was an underlying iliac stenosis which may have caused this acute thrombosis  of the iliac system on the right. He had no obvious reason to have embolized  from his heart. He did not have any history of atrial fibrillation or recent  myocardial infarction. A 2-D echo showed no evidence of intraluminal  thrombus. In addition on the day of admission, he had developed some  clumsiness in his right hand which was transient and also reportedly some  expressive aphasia and facial droop consistent with left hemispheric  transient ischemic attack. His duplex scan prior to his CABG showed no  significant carotid disease bilaterally. It was felt the cerebral  arteriogram was indicated in order to rule out an underlying ulcer in the  left carotid system which might have explained his left  hemispheric  transient ischemic attack. The procedure and potential complications  including but not limited to bleeding, arterial injury, stroke  (perioperative risk 1-2%), dye reaction and kidney failure were discussed  with the patient preoperatively. All of his questions were answered and he  was agreeable to proceed.   TECHNIQUE:  The patient was brought to the [facility] at [facility] and was not  sedated. The left groin was prepped and draped in the usual sterile fashion.  After the skin was anesthetized with 1% Lidocaine, the left common femoral  artery was cannulated and a 5 French sheath introduced into the left common  femoral artery using Seldinger technique. A long pigtail catheter was  positioned over a wire and positioned in the ascending aortic arch and arch  aortogram obtained. The catheter was then exchanged for an H1 catheter which  was positioned into the innominate artery and the guidewire was advanced  into the proximal right common carotid  artery and then the H1 catheter  advanced over the wire. Selective right carotid arteriogram was obtained. I  decided not to selectively cannulate the left common carotid artery as there  was an approximately 40% proximal stenosis here and I did not want to risk  occluding this area with the catheter. The patient had a bow line arch. I  did selectively cannulate the left subclavian artery as there appeared to be  some moderate stenosis here and if the patient ever required carotid  subclavian bypass I wanted to further assess this. The H1 catheter was  brought back into the aortic arch and then positioned into the proximal left  subclavian artery. The guidewire was advanced into the subclavian artery and  then the catheter advanced over the wire. Selective subclavian injection was  made on the left. I then exchanged this catheter for the pigtail catheter  and tried to get some views of the carotid bifurcation on the left through  an arch  injection. The pigtail catheter was positioned just beneath the  innominate origin. Finally the catheter was repositioned at the L1 vertebral  body and flush aortogram obtained and then an oblique iliac projection was  obtained on the right. There was an acute occlusion of the femoral artery on  the right and therefore a right lower extremity runoff film was also  obtained.   FINDINGS:  The patient has a bow line arch. There is no significant  occlusive disease of the aortic arch itself. The innominate artery, right  common carotid artery and right subclavian artery is all widely patent. The  right vertebral artery is dominant. There is an approximately 40% stenosis  of the proximal left common carotid artery at its origin, this is fairly  smooth and does not appear ulcerated.   On the right side, the right common carotid artery is widely patent. I did  not see any focal stenosis of the internal carotid artery on the right  although the vessel is tortuous. The external carotid artery is patent with  no focal stenosis identified. Of note, the intracranial views will be  dictated separately by the neuroradiologist. Of note, I did not obtain  intracranial views on the left as I wanted to limit the dilators and we also  needed to assess the aortoiliac system because of the recent acute arterial  occlusion of the right lower extremity.   The left subclavian artery is patent proximally. There is a focal stenosis  of the proximal left vertebral artery which is a small nondominant vessel.  There is a moderate stenosis distal to this in the subclavian artery, the  vessels widely patent further distally. An oblique projection of the left  carotid bifurcation shows this nicely and there is no focal stenosis or  ulceration noted of the left carotid artery. The internal carotid artery is  widely patent as is the external carotid artery.  Next in the aortoiliac system, there are single renal  arteries bilaterally  with no significant renal artery stenosis identified. The inferior  mesenteric artery appears to be patent. There is only mild diffuse disease  of both common iliac arteries with no focal stenosis identified.  Both the  external iliac arteries are widely patent again with no focal stenosis. The  hypogastric arteries are patent. An oblique projection of the iliac system  on the right shows a stenosis of the proximal hypogastric artery with a  smooth 20% stenosis of the external iliac artery just below the takeoff  of  the hypogastric artery. There is an acute abrupt cutoff of the common  femoral artery on the right. The deep femoral arteries reconstitutes the  collaterals. The superficial femoral artery is occluded at its origin and  there is reconstitution of the popliteal artery at the level of the knee on  the right. There is mild diffuse disease of the popliteal artery with single  vessel runoff through the peroneal artery on the right and some filling  distally of the posterior tibial artery.   CONCLUSION:  1. Bow line arch.  2. No significant carotid bifurcations of these bilaterally.  3. 40% stenosis of the proximal left common carotid artery.  4. Moderate stenosis of the left subclavian artery distal to the takeoff of     a small vertebral artery which has a tight focal proximal stenosis.  5. Only mild iliac occlusive disease bilaterally with no focal stenosis on     either side.  6. Abrupt occlusion of the common femoral artery on the right with     reconstitution of the deep femoral arteries. Two vessel runoff via the     posterior tibial and perianal arteries in the distal right leg.                                               Di Kindle. [name], M.D.   CSD/MEDQ  D:  02/15/2002  T:  02/16/2002  Job:  [id number]   cc:   [facility], [facility]

## 2010-10-25 NOTE — H&P (Signed)
NAMEHANDY, MCLOUD                            ACCOUNT NO.:  [id number]   MEDICAL RECORD NO.:  [id number]                   PATIENT TYPE:  INP   LOCATION:  3303                                 FACILITY:  [facility]   PHYSICIAN:  Maple Mirza, P.A.              DATE OF BIRTH:  1931-08-11   DATE OF ADMISSION:  02/10/2002  DATE OF DISCHARGE:                                HISTORY & PHYSICAL   PRIMARY CARDIOLOGIST:  Dr. Sharon Mt.   PRIMARY CARE PHYSICIAN:  Dr. Birdie Sons.   PRESENTING CIRCUMSTANCE:  This morning I have had pain from my right hip to  my foot, I could not bear any weight on it, it felt numb.   HISTORY OF PRESENT ILLNESS:  The patient is a 77 year old African-American  male who is postoperative day #10 after undergoing coronary artery bypass  graft surgery x4 by Dr. Evelene Croon.  He was recovering at home this  morning in bed at about 10:30, when he noted sudden onset of pain from the  right hip down.  His whole leg became subsequently numb, and when he stood  up to change position it felt as if he was standing on a stump.  He called  his cardiologist who advised him to come to the [facility]  Emergency Room.  Trying to leave the house a hour after the initial  symptoms, he was unable to bear weight on his leg.  His right foot is cool,  numb, but not particularly painful.  He has had no recent history of  lifestyle limiting claudication or rest pain at night in the right foot.  Of  note, just prior to the onset of symptoms this morning, he dropped a spoon  he was holding in his right hand.  At the time, he thought nothing of it.  Later today in the emergency room, he and his wife both noted an episode of  slurred speech and distortion of the left angle of his mouth.  These  symptoms lasted 3 to 5 minutes with complete resolution.   ALLERGIES:  No known drug allergies.   MEDICATIONS:  1. Enteric coated aspirin 325 mg q.d.  2. Altace 2.5 mg q.d.  3.  Pravachol 40 mg b.i.d.  4. Amiodarone ending today 200 mg two tabs b.i.d., starting tomorrow,     02/11/02, 200 mg two tabs q.d.  5. Toprol XL 25 mg q.d.   PAST MEDICAL HISTORY:  1. Known atherosclerotic coronary artery disease, status post myocardial     infarction in 1994, with percutaneous transluminal coronary angioplasty     of the right coronary artery.  He is also status post coronary artery     bypass graft surgery x4 on 01/31/02, and left heart catheterization shows     a history of inferior lateral myocardial infarction.  2. Atrial fibrillation with rapid ventricular rate post coronary artery  bypass grafting, converted and sent home on Amiodarone.  3. History of infrainguinal arterial occlusive disease, he is status post     aortogram with bilateral lower extremity runoff in 4/95.  This study     showed right superficial femoral artery 40 to 50% occlusion at the     origin, 100% occlusion of the SFA prior to the right popliteal artery,     anterior tibial is totally occluded on the right with two vessel runoff     to the right foot.  4. Urethral stricture post coronary artery bypass grafting with outpatient     visit to urologist scheduled.  5. Hypertension.  6. Hyperlipidemia.  7. History of long-term chronic tobacco habituation.  8. Extracranial cerebrovascular occlusive disease with finding of mild     heterogenous plaque in both internal carotid arteries by a carotid duplex     on 01/28/02.   PAST SURGICAL HISTORY:  1. Aortogram with bilateral runoff in 4/95.  2. Left heart catheterization on 01/28/02, with finding of left main and     severe three vessel atherosclerotic coronary artery disease.  3. Status post coronary artery bypass grafting surgery x4 on 01/31/02.   SOCIAL HISTORY:  He has been married for the past 49 years.  He will be  married 50 years in October.  He has four children all healthy.  He is a  long distance Naval architect.  He has a history of tobacco  habituation, one  pack per day, having quit two weeks ago, a 50 year history.  He does partake  of alcoholic beverages occasionally.   FAMILY HISTORY:  Mother died of TB.  Father was murdered when the patient  was 19 years old.  One brother died of complications following a traumatic  amputation at a saw mill.  One brother died of a brain hemorrhage.  The  patient denies any prior history of diabetes mellitus, peptic ulcer disease,  nephrolithiasis, cerebrovascular accident, transient ischemic attack, or  seizure.   PHYSICAL EXAMINATION:  GENERAL:  The patient is alert and oriented x3.  He  has mild distress of the right leg which feels numb.  VITAL SIGNS:  Temperature 97.1, blood pressure 111/61, pulse 62 and regular,  respiratory rate 18 and unlabored.  HEENT:  Pupils equal, round, reactive to light.  Extraocular movements were  intact.  The patient wears both upper and lower dentures.  Mucus membranes  of the oropharynx are pink, moist, without lesion or erythema.  NECK:  Supple.  There are soft left and right carotid bruits auscultated.  No jugular venous distention.  No cervical lymphadenopathy.  CHEST:  There is a fresh cicherix of midline sternotomy healing well,  postoperative day #10 after coronary artery bypass grafting.  LUNGS:  Clear to auscultation and percussion bilaterally.  HEART:  Regular rate and rhythm without murmur.  ABDOMEN:  Soft, nondistended, bowel sounds are present.  No bowel movement  for the last four days.  EXTREMITIES:  No ischemic changes or ulcerations noted.  There is loss of  hair of the lower extremities.  Absent pulses in the right foot and also in  the left foot.  Femoral pulses on the right are absent.  Femoral pulses on  the left are palpable at 4/4.  He has strong Doppler signals in the left  foot at the dorsalis pedis  and posterior tibial region.  He has absent Doppler signals on the right foot.  He has mild clubbing to the fingernails  of  both hands.  The right foot is cool.  Right radial and left radial pulses  are 4/4 bilaterally.  NEUROLOGIC:  Smile is upturning bilaterally.  Tongue is in the midline.  Speech is clear.  He moves upper and lower extremities appropriately.  The  right foot feels numb, but he can wiggle the toes of the right foot,  indicating plantar flex and dorsiflex of the right foot.   IMPRESSION:  1. Infrainguinal arterial occlusive disease with known occlusion of the     right superficial femoral artery just proximal to the popliteal artery by     aortogram in 1995.  2. History of atherosclerotic coronary artery disease, status post coronary     artery bypass grafting on 01/31/02.  3. Transient ischemic attack, left side, in the examination room today at     [facility] affecting speech in the left oral angle lasting 3 to     5 minutes.  4. History of myocardial infarction (inferior lateral in 1994, status post     percutaneous transluminal coronary angioplasty of the right coronary     artery at that time).  5. Hypertension.  6. Hyperlipidemia.  7. History of tobacco habituation.   PLAN:  Aortogram with bilateral lower extremity runoff, probable operating  room to revascularize right lower extremity with postoperative stroke  prophylaxis.                                                Maple Mirza, P.A.    GM/MEDQ  D:  02/10/2002  T:  02/11/2002  Job:  [id number]

## 2010-10-25 NOTE — Cardiovascular Report (Signed)
   NAMEORSON, RHO                            ACCOUNT NO.:  [id number]   MEDICAL RECORD NO.:  [id number]                   PATIENT TYPE:  OIB   LOCATION:  2899                                 FACILITY:  [facility]   PHYSICIAN:  Salvadore Farber, MD [facility]            DATE OF BIRTH:  Dec 14, 1931   DATE OF PROCEDURE:  01/28/2002  DATE OF DISCHARGE:                              CARDIAC CATHETERIZATION   PROCEDURE:  Coronary angiogram.   INDICATIONS FOR PROCEDURE:  Coronary artery disease, positive exercise test.   COMPLICATIONS:  None.   DIAGNOSTIC TECHNIQUE:  Informed consent was obtained.  Under 2% lidocaine  local anesthesia, a #6 French sheath was placed in the right femoral artery  using modified Seldinger technique.  The RCA was engaged initially with a #6  [place] JR4 catheter.  This produced severe catheter damping.  Subsequently  engaged with a #5 [place] JR4 catheter which again produced severe damping of  the catheter.  Injection demonstrated a 70% stenosis of the mid vessel and  severe ostial stenosis with no reflux of contrast.   There is a severe ostial stenosis of the left main coronary artery  (approximately 80%) also associated with severe catheter damping.   DICTATION ENDED AT THIS POINT PER THE PHYSICIAN.                                               Salvadore Farber, MD [facility]    WED/MEDQ  D:  01/28/2002  T:  01/31/2002  Job:  [id number]

## 2010-10-25 NOTE — Consult Note (Signed)
NAMECHRISTOPHER, [name]                            ACCOUNT NO.:  [id number]   MEDICAL RECORD NO.:  [id number]                   PATIENT TYPE:  INP   LOCATION:  3303                                 FACILITY:  [facility]   PHYSICIAN:  Di Kindle. [name], M.D.        DATE OF BIRTH:  17-Feb-1932   DATE OF CONSULTATION:  02/11/2002  DATE OF DISCHARGE:                                   CONSULTATION   PRIMARY CARE PHYSICIAN:  Valetta Mole. Swords, M.D. [facility]   PRIMARY CARDIOLOGIST:  Dr. Corinda Gubler.   HISTORY OF PRESENT ILLNESS:  This is a 77 year old gentleman with recent  CABG surgery complicated by perioperative atrial fibrillation, who now  presents with acute ischemia of the right __________ embolectomy.  The  patient was admitted to hospital in August with unstable angina.  He was  found to have three-vessel disease and he required CABG surgery performed  January 31, 2002.  He had atrial fibrillation in the postoperative period,  prompting initiation of treatment with amiodarone.  He was also discharged  on aspirin 325 mg q.d., metoprolol 25 mg b.i.d., digoxin 0.25 mg q.d.,  ramipril 2.5 mg q.d., and pravastatin 40 mg q.d.  He returned to the  hospital with the sudden onset of pain in the right leg yesterday.  He is  felt to be without pulses in that leg and was taken to the operating room,  where foreign body material that was not highly organized was extracted from  the femoral artery.  He also experienced right hand weakness and speech  difficulties about the time that he developed his right leg pain - these  resolved within a matter of minutes.   PAST MEDICAL HISTORY:  1. Notable for urethral stricture.  2. Hyperlipidemia.   SOCIAL HISTORY:  He lives in [place] with his wife.  He works as a Ecologist.  He had an 80 pack-year history of cigarette smoking that he  discontinued at the time of hospital admission.  No excessive use of  alcohol.   FAMILY HISTORY:  No significant  cardiovascular disease.   REVIEW OF SYSTEMS:  Negative for all systems except as noted above.   PHYSICAL EXAMINATION:  GENERAL:  Well-dressed appearing, trim gentleman in  no acute distress.  VITAL SIGNS:  Temperature is 98.4, heart rate 80 and irregular, respirations  18, blood pressure 120/50, O2 saturation 99% on 2 L nasal O2.  HEENT:  Anicteric sclerae.  ENDOCRINE:  No thyromegaly.  SKIN:  No significant lesions.  HEMATOPOIETIC:  No adenopathy.  NECK:  No JVD or venous distention; no carotid bruits.  THORAX:  Healing sternotomy incision; no particular tenderness; stable  sternum.  CARDIAC:  Grade 2/6 systolic ejection murmur; friction rub present.  LUNGS:  Coarse breath sounds at the left base.  ABDOMEN:  Soft and nontender; healing chest tube insertion sites.  EXTREMITIES:  Distal pulses intact; dressing in place over the right femoral  artery; no edema.  NEUROMUSCULAR:  Normal strength; normal speech.   LABORATORY DATA:  Initial EKG:  Sinus rhythm at a rate of 60; nonspecific  ST/T-wave abnormalities.  Atrial fibrillation now present on the monitor.   Initial laboratory notable for hemoglobin of 8.2.  Potassium is 3.6.  Normal  renal function.   IMPRESSION:  The patient presents with thrombotic or embolic occlusion of  the right femoral artery.  In the setting of concomitant neurological  symptoms and paroxysmal atrial fibrillation, it is highly likely that his  problems are due to embolism from a cardiac source.  Full anticoagulation  has already been initiated - so it will be necessary to convert his heparin  to Coumadin over the next few days.  As he had carotid ultrasounds produced  prior to surgery with plaque, but no significant focal disease - these need  not be repeated.  If amiodarone does not adequately control the paroxysmal  atrial fibrillation - this medication will be discontinued.  His dose of  metoprolol will be increased to provide adequate heart rate  control.  Attempts to maintain sinus rhythm are probably not warranted.  Long term  anticoagulation will be necessary.  She does have evidence of a drug for  pericarditis.  Atrial fibrillation might resolve once pericarditis subsides.   The request for our assistance in the care of this nice gentleman is greatly  appreciated - we will follow him with you.       Gerrit Friends. Dietrich Pates, M.D., [facility]             Di Kindle. [name], M.D.    RMR/MEDQ  D:  02/11/2002  T:  02/13/2002  Job:  [id number]

## 2010-10-25 NOTE — Cardiovascular Report (Signed)
NAMEHAREL, REPETTO                            ACCOUNT NO.:  [id number]   MEDICAL RECORD NO.:  [id number]                   PATIENT TYPE:  OIB   LOCATION:  2899                                 FACILITY:  [facility]   PHYSICIAN:  Salvadore Farber, MD [facility]            DATE OF BIRTH:  1931/10/24   DATE OF PROCEDURE:  DATE OF DISCHARGE:                              CARDIAC CATHETERIZATION   PROCEDURE:  Coronary angiogram.   INDICATIONS FOR PROCEDURE:  Coronary artery disease, positive exercise test.   DIAGNOSTIC TECHNIQUE:  Informed consent was obtained.  Under 2% lidocaine  local anesthesia, a #6 French sheath was placed in the right femoral artery  using the modified Seldinger technique.  A #6 [place] JR4 catheter was used  to engage the right coronary artery.  Due to severe catheter damping with  this catheter, it was exchanged for a #4 [place] JR4 catheter.  This too  produced severe damping unresponsive to intracoronary nitroglycerin.  The  engagement of the left main coronary artery with a #6 [place] JL4 catheter  also produced severe catheter damping.  Engagement of the main with a #6  [place] AL1 catheter produced moderate catheter damping and allowed  angiography.  Left ventriculography was deferred due to left main disease.   FINDINGS:  1. Left main:  There is an 80% stenosis of the left main associated with     damping around the #6 French catheter.  2. LAD:  The LAD has a proximal approximately 50% stenosis at its ostium.     It is a large vessel and a good bypass target.  3. Circumflex:  The circumflex is a large vessel which gives rise to at     least two obtuse marginal branches.  The first two obtuse marginal     branches are large.  The OM-1 has an ostial 70% stenosis.  The OM-2 has a     proximal 90% stenosis.  Both of these vessels are bypassable.  The     circumflex is occluded after the second obtuse marginal branch.  It is     likely that there is a third obtuse marginal  branch supplying a portion     of the lateral wall but it is not visualized on the limited angiography     performed.  4. RCA:  There is a severe ostial stenosis of the right coronary artery     associated with severe damping around the #5 [place] catheter, not     relieved with intracoronary nitroglycerin.  There is a 70% stenosis of     the mid right coronary artery.   IMPRESSION/PLAN:  Severe ostial stenoses of both the left main and right  coronary arteries.  The left anterior descending artery, right coronary  artery, obtuse marginal #1, and obtuse marginal #2 vessels are all good  bypass targets.  We will refer to CVTS for consideration of  coronary artery  bypass grafting.  Will admit to telemetry and obtain an echocardiogram to  assess left ventricular and valvular function.                                               Salvadore Farber, MD [facility]    WED/MEDQ  D:  01/28/2002  T:  01/31/2002  Job:  [id number]   cc:   Valetta Mole. Swords, M.D. [facility]

## 2010-10-25 NOTE — Op Note (Signed)
[name], [name]                            ACCOUNT NO.:  [id number]   MEDICAL RECORD NO.:  [id number]                   PATIENT TYPE:  INP   LOCATION:  2027                                 FACILITY:  [facility]   PHYSICIAN:  Di Kindle. [name], M.D.        DATE OF BIRTH:  [birth year]   DATE OF PROCEDURE:  02/15/2002  DATE OF DISCHARGE:                                 OPERATIVE REPORT   PREOPERATIVE DIAGNOSIS:  Occluded right common femoral artery.   POSTOPERATIVE DIAGNOSIS:  Occluded right common femoral artery.   PROCEDURES:  1. Right femoral thrombectomy.  2. Right common femoral artery endarterectomy with profundoplasty and Dacron     patch angioplasty.   SURGEON:  Di Kindle. [name], M.D.   ASSISTANT:  Caralee Ates, M.D.   ANESTHESIA:  Local with sedation.   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  He underwent a  right femoral thrombectomy with vein patch angioplasty of the common femoral  artery.  He then was brought back to the arteriogram suite for arteriography  to look for an underlying iliac lesion which might have caused the iliac  artery to thrombose on his initial presentation.  Of note, he had no reason  to have embolized from his heart, and 2 D echo had been unremarkable.  His  arteriogram showed that the iliac artery was open; however, he had recently  thrombosed his common femoral artery, and therefore he was taken back to the  operating room for thrombectomy of the common femoral artery and  exploration.  Of note, he had also had a left hemispheric transient ischemic  attack on the day of his initial presentation, and his duplex scan prior to  his CABG recently had shown no significant carotid stenosis.  For this  reason, he underwent a cerebral arteriogram to look for an  underlying  carotid ulcer.  Postoperatively he developed some slight facial droop on the  left.  Therefore, this appeared to be improving and he had no focal  weakness.  We decided given the ischemia of the right leg that we still  needed to proceed with the thrombectomy of his right femoral artery but  decided to do this under local anesthesia.   DESCRIPTION OF PROCEDURE:  The patient was brought to the operating room and  sedated by anesthesia.  The right groin and entire right lower extremity  were prepped and draped in the usual sterile fashion.  After the skin was  infiltrated with 1% lidocaine, the previous incision was opened and the  common femoral, deep femoral, and superficial femoral arteries were all  dissected free.  I extended the dissection out onto the deep femoral artery  further distally to allow better exposure of the deep femoral artery.  The  patient was then heparinized with 7000 units of IV heparin.  Clamps were  then placed on the common femoral, deep femoral, and superficial femoral  arteries.  The previous vein patch was removed, and the arteriotomy was  extended proximally and distally.  There was moderate plaque in the common  femoral artery, but upon further exploration there was noted to be a tight  stenosis of the proximal deep femoral artery.  For this reason I extended  the arteriotomy down onto the deep femoral artery.  The SFA was chronically  occluded at its origin.  An endarterectomy plane was established, and the  common femoral artery endarterectomy was performed until the plaque thinned  out further proximally, where it was sharply divided.  Distally there was a  nice taper at the origin of the deep femoral artery, and no tacking sutures  were required.  The superficial femoral artery was divided to allow a nice  patch closure of the common femoral artery extending onto the deep femoral  artery.  This was done with a Dacron patch sewn with continuous  6-0 Prolene  suture.  At the completion there was an excellent pulse in the deep femoral  artery and good Doppler flow in the posterior tibial artery in the foot.  The wound was closed with a deep layer of 2-0 Vicryl after hemostasis was  obtained.  A 15 Blake drain was placed.  The subcutaneous layer was closed  with 3-0 Vicryl and the skin closed with a 4-0 subcuticular stitch.  A  sterile dressing was applied.  The patient tolerated the procedure well and  was transferred to the recovery room in satisfactory condition.  All needle  and sponge counts were correct.                                               Di Kindle. [name], M.D.    CSD/MEDQ  D:  02/15/2002  T:  02/16/2002  Job:  [id number]

## 2010-10-25 NOTE — Op Note (Signed)
NAMEHARRISON, [name]                            ACCOUNT NO.:  [id number]   MEDICAL RECORD NO.:  [id number]                   PATIENT TYPE:  INP   LOCATION:  2313                                 FACILITY:  [facility]   PHYSICIAN:  Alleen Borne, M.D.               DATE OF BIRTH:  Oct 08, 1931   DATE OF PROCEDURE:  01/31/2002  DATE OF DISCHARGE:                                 OPERATIVE REPORT   PREOPERATIVE DIAGNOSIS:  Left main and severe two-vessel coronary artery  disease.   POSTOPERATIVE DIAGNOSIS:  Left main and severe two-vessel coronary artery  disease.   PROCEDURES:  1. Median sternotomy.  2. Extracorporeal circulation.  3. Coronary artery bypass graft surgery x4 using a left internal mammary     artery graft to the left anterior descending coronary artery, with a     sequential saphenous vein graft to the first and second obtuse marginal     branches of the left circumflex coronary, and a saphenous vein graft to     the right coronary artery.  4. Endoscopic vein harvesting from the left thigh.   SURGEON:  Alleen Borne, M.D.   ASSISTANTS:  Salvatore Decent. Dorris Fetch, M.D., and Debby Freiberg. Thomasena Edis, P.A.   ANESTHESIA:  General endotracheal.   CLINICAL HISTORY:  This patient is a 77 year old gentleman with a history of  coronary artery disease, status post inferior MI in 1994, treated with PTCA  of the right coronary artery.  At that time he had 50-60% left circumflex,  50-60% diagonal, and a normal LAD.  Ejection fraction was 50-55%.  He also  had significant lower extremity peripheral vascular disease that has been  treated medically.  He has a history of heavy smoking and continues to  smoke.  He recently underwent a stress Cardiolite exam on 01/10/02 after being  lost to follow-up.  This Cardiolite scan showed inferolateral infarct with  mild to moderate peri-infarct ischemia.  Ejection fraction was 32%.  He  subsequently underwent cardiac catheterization, which showed an 80%  ostial  left main stenosis with catheter dampening.  The first obtuse marginal had  70% stenosis.  The second marginal had 90% stenosis.  The right coronary  artery had a high-grade ostial stenosis with catheter dampening, and there  was also about 70% midvessel stenosis.  After review of the angiogram and  examination of the patient, it was felt that coronary artery bypass graft  surgery was the best treatment to prevent further ischemia and infarction.  I discussed the operative procedure with the patient and his wife, including  alternatives, benefits, and risks, including bleeding, blood transfusion,  infection, stroke, myocardial infarction, and death.  I also discussed the  importance of complete smoking cessation and maximum cardiac risk factor  reduction for preventing premature graft closure.  They understood and  agreed to proceed.   DESCRIPTION OF PROCEDURE:  The patient was taken  to the operating room and  placed on the table in the supine position.  After induction of general  endotracheal anesthesia, an attempt was made to place a Foley catheter by  the nursing staff.  They were unsuccessful.  I tried to pass the Foley  catheter but could not.  Therefore, a urology consultation was obtained and  Sigmund I. Patsi Sears, M.D., was able to insert the Foley catheter in the  operating room.  Then the chest, abdomen, and both lower extremities were  prepped and draped in the usual sterile manner.  The chest was entered  through a median sternotomy incision and the pericardium opened in the  midline.  Examination of the heart showed good ventricular contractility.  The ascending aorta had no palpable plaques in it.   Then the left internal mammary artery was harvested from the chest wall as a  pedicle graft.  This was a medium-caliber vessel with excellent blood flow  through it.  At the same time a segment of greater saphenous vein was  harvested from the left thigh using  endoscopic vein harvest technique.  This  vein was of medium size and good quality.   Then the patient was heparinized and when an adequate activated clotting  time was achieved, the distal ascending aorta was cannulated using a 20  French aortic cannula for arterial inflow.  Venous outflow was achieved  using a two-stage venous cannula through the right atrial appendage.  An  antegrade cardioplegia and vent cannula was inserted in the aortic root.   The patient was placed on cardiopulmonary bypass and the distal coronary  arteries identified.  The LAD was a large, graftable vessel.  The diagonal  branch had no visible disease in it.  The first marginal was a small to  medium-sized vessel that was graftable.  The second marginal was a large  vessel that had some patchy distal plaque in it.  The right coronary artery  was heavily diseased proximally but just before the takeoff of the posterior  descending branch was soft and free of disease.   Then the aorta was crossclamped and 500 cc of cold blood antegrade  cardioplegia was administered in the aortic root with quick arrest of the  heart.  Systemic hypothermia to 20 degrees Centigrade and topical  hypothermia with iced saline was used.  A temperature probe was placed in  the septum and an insulating pad in the pericardium.   The first distal anastomosis was performed to the distal right coronary  artery.  The internal diameter was greater than 2.5 mm.  The conduit used  was a segment of greater saphenous vein and the anastomosis performed in an  end-to-side manner using continuous 7-0 Prolene suture.  Flow was measured  through the graft and was excellent.   The second distal anastomosis was performed to the intermediate or first  marginal artery.  The internal diameter of this vessel was 1.6 mm.  The  conduit used was a second segment of greater saphenous vein and the anastomosis performed in a sequential side-to-side manner using  continuous 7-  0 Prolene suture.  Flow was measured through the graft and was excellent.  Then another dose of cardioplegia was given down the grafts and in the  aortic root.   The third distal anastomosis was then performed to the second obtuse  marginal branch.  The internal diameter was about 2 mm.  The conduit used  was the same segment of greater saphenous vein  and the anastomosis performed  in a sequential end-to-side manner using continuous 7-0 Prolene suture.  Flow was measured through the graft and was excellent.  Another dose of  cardioplegia was given down the vein grafts and in the aortic root.   The fourth distal anastomosis was performed to the midportion of the left  anterior descending coronary artery.  The internal diameter of this vessel  was about 2.5 mm.  The conduit used was the left internal mammary graft, and  this was brought through an opening in the left pericardium anterior to the  phrenic nerve.  It was anastomosed to the LAD in an end-to-side manner using  continuous 8-0 Prolene suture.  The pedicle was tacked to the epicardium  with 6-0 Prolene suture.  The patient was rewarmed to 37 degrees Centigrade  and the clamp released from the mammary pedicle.  There was rapid warming of  the ventricular septum and return of spontaneous ventricular fibrillation.  The crossclamp was removed with a time of 55 minutes and the patient  spontaneously converted to sinus rhythm.   A partial occlusion clamp was placed on the aortic root, and the two  proximal vein graft anastomoses were performed in an end-to-side manner  using continuous 6-0 Prolene suture.  The clamp was removed, the vein grafts  de-aired, and the clamps removed from them.  The proximal and distal  anastomoses appeared hemostatic and the alignment of the grafts  satisfactory.  Graft markers were placed on the proximal anastomosis.  Two  temporary right ventricular and right atrial pacing wires were placed  and  brought out through the skin.   When the patient had rewarmed to 37 degrees Centigrade, he was weaned from  cardiopulmonary bypass on no inotropic agents.  Total bypass time was 91  minutes.  Cardiac function appeared excellent with a cardiac output of 5  L/min.  Protamine was given, and the venous and aortic cannulas were removed  without difficulty.  Hemostasis was achieved.  Three chest tubes were placed  with two in the posterior pericardium, one in the left pleural space, and  one in the anterior mediastinum.  The pericardium was reapproximated over  the heart.  The sternum was closed with #6 stainless steel wires.  The  fascia was closed with continuous #1 Vicryl suture.  The subcutaneous tissue  was closed with continuous 2-0 Vicryl and the skin with 3-0 Vicryl  subcuticular closure.  The lower extremity vein harvest site was closed in  layers in a similar manner.  The sponge, needle, and instrument counts were correct according to the scrub nurse.  Dry sterile dressings were applied  over the incisions and around the chest tubes, which were hooked to  Pleuravac suction.  The patient remained hemodynamically stable and was  transported to the SICU in guarded but stable condition.                                                Alleen Borne, M.D.    BKB/MEDQ  D:  01/31/2002  T:  02/02/2002  Job:  [id number]   cc:   [facility]   [facility]

## 2010-10-25 NOTE — Consult Note (Signed)
   [name], [name]                            ACCOUNT NO.:  [id number]   MEDICAL RECORD NO.:  [id number]                   PATIENT TYPE:  INP   LOCATION:  3303                                 FACILITY:  [facility]   PHYSICIAN:  [name], M.D.               DATE OF BIRTH:  Dec 01, 1931   DATE OF CONSULTATION:  02/10/2002  DATE OF DISCHARGE:                                   CONSULTATION   HISTORY OF PRESENT ILLNESS:  The patient is a 77 year old black male who is  seen as an intraoperative consultation for difficult Foley catheter  placement.  He is scheduled to undergo coronary artery bypass grafting and  initial attempts at placing a Foley catheter were unsuccessful.  Several  different sizes were tried as well as coude catheters without success.  I  was contacted and evaluated the patient.   I note on examination that he has an uncircumcised phallus without lesions  or discharge.  His scrotum and testicles, epididymus, anus and perineum are  normal.  Rectal tone is slightly diminished due to anesthesia but the  prostate is noted to be smooth and symmetric and has no suspicious  nodularity or induration.   DESCRIPTION OF PROCEDURE:  Using sterile technique, the phallus was then  sterilely prepped and draped and a 17 French flexible cystoscope was then  introduced per urethra and under direct visualization the urethra was  inspected.  It was noted to be normal down to the bulbar urethra where there  was a small false passage at the 6 o'clock position in the bulbar urethra.  The sphincter was intact and the prostatic urethra was noted to be  unobstructing without lesion.  The bladder was briefly inspected and noted  to have no significant lesions.   I removed the cystoscope and then initially tried to pass an 18 French  catheter but was successful, so I switched to an 34 [place] coude catheter  and was able to negotiate this into the bladder.  The bladder was then  drained and  Dr. [name] then preceded with his surgery.   This minor urethral injury should heal spontaneously and not require any  further therapy.  The catheter will be left in place for a couple of days  and once removed, if any further difficulty should occur, they can be  instructed to contact me; otherwise this should heal without further  sequelae.                                               [name], M.D.    MCO/MEDQ  D:  02/10/2002  T:  02/12/2002  Job:  [id number]

## 2010-10-25 NOTE — Cardiovascular Report (Signed)
[name], [name] NO.:  [id number]   MEDICAL RECORD NO.:  [id number]          PATIENT TYPE:  INP   LOCATION:  3710                         FACILITY:  [facility]   PHYSICIAN:  Charlies Constable, M.D. [facility] DATE OF BIRTH:  1932/05/06   DATE OF PROCEDURE:  05/12/2005  DATE OF DISCHARGE:                              CARDIAC CATHETERIZATION   CLINICAL HISTORY:  Mr. Kingry is 77 years old and has had previous bypass  surgery in 2003.  He had a syncopal episode in the office of Dr. Estill Bakes  and was admitted to the hospital and was found to be in atrial flutter.  He  also has had previous thrombectomy of the right femoral artery by Dr. Woodfin Ganja.  He was scheduled for evaluation with angiography today and a flutter  ablation if we did not find any source of ischemia.   PROCEDURE:  The procedure was performed by the left femoral artery using an  arterial sheath and 6 French preformed coronary catheters.  We had to use a  Doppler needle to obtain access since the pulse was quite difficult to feel.  We used a LIMA catheter for injecting the LIMA graft.  A distal aortogram  was performed to evaluate his peripheral vascular disease.  The patient  tolerated the procedure well and left the laboratory in satisfactory  condition.   RESULTS:  The aortic pressure was 142/672 with a mean of 99 and the left  ventricular pressure was 142/4.   Left main coronary artery:  The left main coronary artery was free of  significant disease.   Left anterior descending artery:  The left anterior descending artery gave  rise to a diagonal branch and three septal perforators.  There was 40%  narrowing of the proximal LAD, and irregularities in the proximal and mid  LAD.  The LIMA graft could be seen which was still retrograde and appeared  atretic.   Circumflex artery:  The circumflex artery was complete throughout its  origin.   Right coronary artery:  The right coronary artery was diffusely  diseased  with a focal 90% stenosis distally and competing flow distally.   The saphenous vein graft to the distal right coronary artery was patent and  functioned well.  There was some irregularities, but no significant  stenosis.  There was 40% narrowing in the distal right coronary artery after  insertion of the vein graft.   The saphenous vein graft to the marginal and posterolateral branch of the  circumflex artery was patent and functioned normally.  There was some  irregularities in the graft, but no significant obstruction.   The LIMA graft to the LAD was patent, but was very atrophic.   Left ventriculogram:  The left ventriculogram performed in the RAO  projection showed hypokinesis of the inferobasal segment.  The overall wall  motion was fairly good with an estimated ejection fraction was 50%.   Distal aortogram:  A distal aortogram showed patent renal arteries.  The  aortoiliac vessels were irregular, but there was no major obstruction.  There was 50% narrowing  in the femoral artery just before its bifurcation on  the left side.   CONCLUSION:  1.  Coronary artery disease status post prior coronary artery bypass graft      surgery in 2003.  2.  Severe native vessel disease with 40% narrowing of the proximal left      anterior descending, total occlusion of the proximal circumflex artery,      and diffuse disease in the distal right coronary artery with 90% distal      stenosis and competing flow distally.  3.  Patent vein graft to the distal right coronary artery, patent sequential      vein graft to the marginal and posterolateral branch of the circulation      artery, and an atrophic LIMA graft to the LAD.  4.  Inferobasal wall hypokinesis with an estimated ejection fraction of 50%.   RECOMMENDATIONS:  There does not appear to be any source of ischemia to  explain the patient's recent syncopal episode.  We will plan to proceed with  flutter ablation later  today.           ______________________________  Charlies Constable, M.D. [facility]     BB/MEDQ  D:  05/12/2005  T:  05/12/2005  Job:  [id number]   cc:   Valetta Mole. Swords, M.D. [facility]  [address] [place]  [place] [address]   E. Graceann Congress, M.D.  [address]  [address]  [place]  [place] [address]   Doylene Canning. Ladona Ridgel, M.D.  [address]  [address]  [place]  [place] [address]

## 2010-10-25 NOTE — Discharge Summary (Signed)
NAMEPRAKASH, [name]                            ACCOUNT NO.:  [id number]   MEDICAL RECORD NO.:  [id number]                   PATIENT TYPE:  INP   LOCATION:  2022                                 FACILITY:  [facility]   PHYSICIAN:  Alleen Borne, M.D.               DATE OF BIRTH:  July 20, 1931   DATE OF ADMISSION:  01/28/2002  DATE OF DISCHARGE:  02/05/2002                                 DISCHARGE SUMMARY   ADMISSION DIAGNOSIS:  Coronary artery disease.   ADMITTING SECONDARY DIAGNOSES:  1. Myocardial infarction.  2. Postoperative atrial fibrillation.  3. Postoperative anemia, secondary to blood loss.   DISCHARGE DIAGNOSIS:  Coronary artery disease.   HOSPITAL COURSE:  Mr. Merolla was admitted to [facility]  on January 29, 2002, with a known history of coronary artery disease.  On the  date of admission he underwent a cardiac catheterization.  This revealed  that he had significant coronary artery disease and needed surgical  correction.  Because of this, Dr. Alleen Borne was consulted.  On January 31, 2002, Dr. Laneta Simmers performed a coronary artery bypass grafting x4 with a  left internal mammary artery anastomosis to the left anterior descending  coronary artery, a saphenous vein graft to the right coronary artery, and a  sequential saphenous vein graft to the intermediate second obtuse marginal  artery.  Postoperatively the patient had some postoperative anemia secondary  to blood loss, which he tolerated well.  No blood transfusions were  required.  His hemoglobin stabilized at 10.2 and 31.3.  His postoperative  course was complicated by atrial fibrillation.  He was rate controlled with  digoxin and Lopressor.  He failed to convert to normal sinus rhythm  spontaneously, so he was started on amiodarone.  After an IV loading dose of  amiodarone he converted back to normal sinus rhythm, in which he remained  throughout the remainder of his hospital course.   DISCHARGE ACTIVITIES:  Subsequently he remained stable for discharge home on  February 05, 2002.   DISCHARGE MEDICATIONS:  1. Aspirin 325 mg one q.d.  2. Lopressor 50 mg 1/2 tab q.12h.  3. Digoxin 0.25 mg one q.d.  4. Altace 2.5 mg one q.d.  5. Pravachol 30 mg, two tab q.d.  6. Amiodarone 200 mg, two tab b.i.d. for seven days, then the patient will     take one tab b.i.d.  7. Tylox one to two tab q.4-6h. p.r.n. pain.   ACTIVITY:  The patient is to abstain from driving, strenuous activity, or  heavy lifting of heavy objects.  He is told __________ exercises p.r.n.   DIET:  __________ salt diet.   WOUND CARE:  The patient is told that he may shower and clean his incisions  with soap and water.   FOLLOW UP:  The patient is told to call his cardiologist, Dr. Cecil Cranker,  for a two-week follow-up appointment.  He is told to see Dr. Laneta Simmers  at the [facility] office on February 22, 2002, at 9:30 a.m.  He is told to bring  a chest x-ray with him.        Levin Erp. Steward, P.A.                      Alleen Borne, M.D.    BGS/MEDQ  D:  02/04/2002  T:  02/07/2002  Job:  [id number]   cc:   [facility]   [facility] Office

## 2010-10-25 NOTE — Discharge Summary (Signed)
NAMESAIVION, GOETTEL                            ACCOUNT NO.:  [id number]   MEDICAL RECORD NO.:  [id number]                   PATIENT TYPE:  INP   LOCATION:  2027                                 FACILITY:  [facility]   PHYSICIAN:  Di Kindle. [name], M.D.        DATE OF BIRTH:  09-30-1931   DATE OF ADMISSION:  02/10/2002  DATE OF DISCHARGE:  02/20/2002                                 DISCHARGE SUMMARY   DISCHARGE DIAGNOSES:  1. 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  2. Left transient ischemic attacks x2 at the time of admission in the     emergency room at [facility].  3. Atrial fibrillation postoperatively this admission with controlled     ventricular rate, home on therapeutic Coumadin.  4. Difficult passage of Foley catheter preoperatively with postoperative     traumatic hematuria, now resolved.  5. Current Pseudomonas urinary tract infection, asymptomatic.  The patient     is asked to present to his primary care giver for follow up urinalysis     and culture in two weeks.   SECONDARY DIAGNOSES:  1. Coronary artery disease, status post myocardial infarction, 1994, with     percutaneous transluminal coronary angioplasty, right coronary artery,     status post coronary artery bypass grafting x4 January 31, 2002, by Evelene Croon, M.D.  2. History of infrainguinal arterial occlusive  disease.  3. Hypertension.  4. Hyperlipidemia.  5. Long-term current tobacco habituation.  6. Extracranial cerebrovascular occlusive disease with swelling of mild     heterogeneous plaque in both internal carotid arteries by duplex     ultrasound prior to coronary artery bypass grafting on January 28, 2002.  7. Left heart  catheterization, January 28, 2002, demonstrating left main     with severe three-vessel coronary artery disease.  8. Atrial fibrillation with rapid ventricular rate, post coronary artery     bypass grafting surgery, converted to a sinus rhythm, on a beta blocker  and amiodarone.  9. Urethral stricture with difficult Foley placement to preop pre-coronary     artery bypass grafting.  10.      Left ventricular dysfunction with an ejection fraction of 25-30%.   PROCEDURES:  1. February 10, 2002, aortogram.  This study showed long segment occlusion     in the right iliac artery and possibility of extension to the right     superficial femoral artery.  2. February 10, 2002, right femoral embolectomy with intraoperative     arteriogram x2, Di Kindle. [name], M.D.  3. February 11, 2002, postoperative ankle brachial indexes; on the right     0.40, on the left 0.75.  4. February 11, 2002, 2-D echocardiogram to rule out thrombus.  No thrombus     noted.  Left ventricular ejection fraction measured 25-30%.  5. February 15, 2002, heart aortogram.  The study demonstrated left common     carotid artery  stenosis without significant internal carotid artery     disease.  The right iliac artery was without stenosis or occlusion but     the right femoral artery was found to be reoccluded.  There was a left     facial droop after angiography but this has resolved.  6. February 15, 2002, right femoral thrombectomy, right common femoral     artery endarterectomy and profundoplasty with Dacron patch angioplasty,     Di Kindle. [name], M.D., noted that the proximal right deep femoral     artery stenosis likely causing the thrombosis.  7. February 16, 2002, ankle brachial indexes on the right elevated to 0.51,     on the left 0.90.   DISCHARGE DISPOSITION:  The patient is ready for discharge, postoperative  day 10, after the original right femoral involved embolectomy and  postoperative day 5 after the right common femoral artery endarterectomy and  second attempt at right femoral embolectomy.  He has remained afebrile  postoperatively.  He is ambulating independently.  His incision is healing  nicely in the right groin.  He has strong Doppler flow to both  right and  left lower extremity.  He did have some postoperative anemia, which is  resolving.  His hemoglobin September 14 was 8.5, hematocrit 26.4.  After his  second surgery for right femoral artery occlusion, he was started on  Coumadin.  He was also maintained on IV heparin.  The Coumadin is  therapeutic.  Postoperative day 5, September 14, his pain is very adequately  controlled with oral Percocet.  His mental status has been clear in the  postoperative period.  He did have some bleeding after his first surgery  from the urethra.  This encompassed of period of 2-3 days and was quite  intense at times.  However, this has now resolved.  He was status post  difficult passage of Foley catheter prior to his surgeries and in fact,  required urology's help.  He was found to have a Pseudomonas urinary tract  infection by culture, although, he is asymptomatic.  This treatment is  referred to Bruce H. Swords, M.D. for a redoing of his urinalysis in two  weeks and treatment if appropriate.   DISCHARGE MEDICATIONS:  1. Pravachol 40 mg 2 tabs daily at bedtime.  2. Multivitamin daily.  3. Folic acid 400 mcg 2 tabs daily.  4. Toprol XL 75 mg daily, one 50 mg tablet and one 25 mg tablet daily.  5. Flomax 0.4 mg daily at bedtime.  6. Coumadin 5 mg daily, or as directed by his cardiologist.  7. Cardizem CD 120 mg daily.  8. Protonix 40 mg daily.  9. Percocet 5/325 mg 1-2 tabs p.o. q.4-6h. p.r.n. pain.  10.      Altace 2.5 mg daily.   DISCHARGE ACTIVITIES:  He is to walk daily to keep up his strength and to  keep the circulation in his legs healthy.   DISCHARGE DIET:  Low sodium and low cholesterol diet.   HOME CARE:  He may shower daily, keep his incision clean and dry.  He will  follow up with Dr. Laneta Simmers Tuesday, February 22, 2002.  He will get a chest  x-ray at [facility] one hour before his appointment.  He has another visit with Dr. Graceann Congress Tuesday, March 08, 2002, at  11:30 in the morning and another visit with Dr. Waverly Ferrari  Wednesday, March 09, 2002, at 23.  He is also recommended  to contact Dr.  Birdie Sons for follow up on his Pseudomonas urinary tract infection.  He  will get blood tested at the [facility] Wednesday,  September 17 with the aim of correcting his Coumadin dosage if necessary.  Repeat blood tests are recommended until his Coumadin dose is stable.   BRIEF HISTORY:  The patient is a 77 year old African-American male.  He is  postoperative day 10, after undergoing coronary artery bypass grafting  surgery by Dr. Laneta Simmers.  He was recovering at home on the morning of his  admission on September 4.  He noticed a sudden onset of pain from the right  hip, down his whole leg, and became subsequently numb and when he stood up  to change position, he fell to his left knee, he was standing on his stump.  He called his cardiologist who advised him to come to the [facility] Emergency Room.  Trying to leave the house an hour after his  initial symptoms, he was actually unable to bear weight on his leg.  On  arrival, his right foot is cool, numb, but not particularly painful.  Of  note, just prior to onset of symptoms the morning of his admission, he  dropped the spoon he was holding in his right hand.  At that time, he  thought nothing of it, just that he was mildly clumsy.  Later on today in  the emergency room, he and his wife both noted an episode of slurred speech  and distortion of the left angle of his mouth.  The symptoms lasted 3-5  minutes and were completely resolved at the time of his examination.  He is  admitted to Dr. Chuck Hint service with arterial occlusion of the  right lower extremity.  He will undergo an aortogram with bilateral lower  extremity runoff to determine the site of the occlusion and the extent.  After admission to [facility], the patient  underwent aortogram with  bilateral lower extremity arteriogram.  The study showed that the right  common iliac artery at the origin has occlusive thrombus which extends to  the distal right external iliac.  There is segmental reconstitution of the  superficial femoral artery and popliteal.  A poorly visualized peroneal and  posterior tibial artery serves as runoff.  He has mild left external iliac  and common femoral atheromatous disease with two-vessel runoff on the left.  After angiography, the patient was taken to the operating room where right  femoral embolectomy with return of the circulation to the right lower  extremity.  Since he had some symptoms of TIAs prior to this admission, he  was scheduled for arteriogram.  This was done on September 9 and the study  showed no internal carotid artery disease.  At the same time, the right  iliac artery was imaged and found to be free of stenosis but the right femoral artery was found to be reoccluded.  Subsequently, that same day he  underwent right femoral thrombectomy, right common femoral artery  endarterectomy, and profundoplasty with Dacron patch angioplasty.  He was  placed on IV heparin after that and started on Coumadin.  He did have some  return of atrial fibrillation with rapid ventricular rate on postoperative  day 3, but this was treated with an increase in beta blocker as well as  starting Cardizem 30 mg every six hours.  He converted readily to a sinus  rhythm and remained in sinus rhythm  for 36 hours.  However, on midday of the  day of his discharge, September 14, he went into atrial flutter which  devolved through atrial fibrillation but with controlled ventricular rate.  At this time, his Coumadin was therapeutic and after consultation with his  cardiologist, it was seen fit for the patient to be discharged since he had  no symptoms of dizziness and his blood pressure was adequate in a range of  98/48 and was felt suitable  for discharge home.  He goes home with the  medications and follow up as dictated above.     Maple Mirza, P.A.                    Di Kindle. [name], M.D.    GM/MEDQ  D:  02/20/2002  T:  02/21/2002  Job:  [id number]   cc:   Di Kindle. [name], M.D.  [address]  [place]  [place] [address]  Fax: [phone]   E. Graceann Congress, M.D. [facility]   Bruce H. Swords, M.D. [facility]   Alleen Borne, M.D.  [address]  [place]  [place] [address]  Fax: [phone]

## 2010-10-25 NOTE — Op Note (Signed)
[name], [name]                ACCOUNT NO.:  [id number]   MEDICAL RECORD NO.:  [id number]          PATIENT TYPE:  INP   LOCATION:  3710                         FACILITY:  [facility]   PHYSICIAN:  Doylene Canning. Ladona Ridgel, M.D.  DATE OF BIRTH:  [birth year]   DATE OF PROCEDURE:  05/12/2005  DATE OF DISCHARGE:                                 OPERATIVE REPORT   PROCEDURE PERFORMED:  Electrophysiology study and radiofrequency catheter  ablation of atrial flutter.   ATTENDING:  Doylene Canning. Ladona Ridgel, M.D.   I. INTRODUCTION:  The patient is a 77 year old man who was admitted to  hospital with atrial flutter and syncope.  He underwent catheterization  demonstrating no new graft blockages or worsening coronary disease.  His EF  was 50%.  He had no palpitations prior to his near-syncopal episode.  He is  now referred for EP study and catheter ablation of atrial flutter.   PROCEDURE:  After informed consent was obtained, the patient was taken to  the diagnostic EP lab in the fasting state.  After the usual preparation and  draping, intravenous fentanyl and midazolam were given for sedation.  A 6-  [place] Hexapolar catheter was inserted percutaneously into the right jugular  vein and inserted into the coronary sinus.  A 7-French 20-pole halo catheter  was inserted percutaneously in the right femoral vein and advanced to the  right atrium.  A 5-French quadripolar catheter was inserted percutaneously  in the right femoral vein and advanced to the His bundle region.  After  measurement of the basic intervals, mapping was carried out demonstrating  typical counterclockwise tricuspid annular reentrant atrial flutter at a  cycle length of 245 milliseconds.  The ablation catheter was maneuvered into  the usual atrial flutter isthmus and initially 4 RF energy applications were  delivered resulting in termination of atrial flutter and restoration of  sinus rhythm.  Pacing from the coronary sinus demonstrated isthmus  conduction to be present.  Two additional RF energy applications were  delivered which resulted in isthmus block.  Following this, 3 bonus RF  energy applications were delivered and the patient was observed for 45  minutes and at that time had no isthmus conduction.  Rapid atrial pacing was  then carried out from the coronary sinus as well as from the high right  atrium at pacing cycle lengths of 600 milliseconds and stepwise decreased  down to 420 milliseconds where A-V Wenckebach was observed.  During rapid  atrial pacing, the P-R interval was shorter than the R-R interval and there  was no inducible SVT following ablation.  Next, programmed atrial  stimulation was carried out from the coronary sinus as well as the high  right atrium at basic drive cycle lengths of 161 and 500 milliseconds.  The  S1-S2 interval was stepwise decreased down to 370 milliseconds, where the AV  node ERP was observed.  During programmed atrial stimulation, there were A-H  jumps, but there no echo beats noted.  There is no inducible SVT noted.  Next, rapid ventricular pacing was carried out from the RV apex and this  demonstrated V-A dissociation at 600 milliseconds.  At this point, the  patient was observed for 45 minutes and had no recurrent atrial flutter  isthmus conduction.  The catheters were then removed, hemostasis was  assured, and the patient was returned to his room in satisfactory condition.   III. COMPLICATIONS:  There were no immediate procedural complications.   IV. RESULTS:  A.  Baseline ECG:  The baseline ECG demonstrates atrial  flutter with variable A-V conduction.  B.  Baseline intervals:  The atrial flutter cycle length was 245  milliseconds.  The QRS duration was 120 milliseconds.  The A-H interval  following ablation was 92 milliseconds and there was no rate change in the H-  V interval after ablation.  C.  Rapid ventricular pacing: Rapid ventricular pacing was carried out from  the RV  apex, demonstrating V-A dissociation.  D.  Programmed ventricular stimulation:  Programmed ventricular stimulation  was carried out from the RV apex at basic drive cycle length of 528  milliseconds, demonstrating V-A dissociation.  E.  Rapid atrial pacing:  Rapid atrial pacing was carried out the coronary  sinus as well as the high right atrium at pacing cycle length of 600  milliseconds and stepwise decreased down to 420 milliseconds, where A-V  Wenckebach was observed.  During rapid atrial pacing, the P-R interval was  less than the R-R interval and there was no inducible SVT.  F.  Programmed atrial stimulation:  Programmed atrial stimulation was  carried out from the coronary sinus as well as the high right atrium at base  drive cycle length of 413 milliseconds.  This S1-S2 interval was stepwise  decreased down to 370 milliseconds, resulting in the A-V node ERP.  During  programmed atrial stimulation, there were multiple A-H jumps, but no echo  beats noted.  G.  Arrhythmias observed:  1.  Atrial flutter:  Initiation present time of EP study.  Duration      sustained.  Cycle length 245 milliseconds.  Method of termination was      with catheter ablation.      1.  Mapping. Mapping of the patient's atrial flutter demonstrated          typical counterclockwise tricuspid annular reentrant atrial flutter.          1.  RF energy application:  A total of 9 RF energy applications were              delivered.  During the fourth RF energy application, sinus              rhythm was restored.  During the sixth RF energy application,              isthmus block was demonstrated and 3 bonus RF energy              applications were delivered.  The patient was observed for 45              minutes and had no recurrent atrial flutter or isthmus              conduction.   V. CONCLUSION:  This study demonstrates successful electrophysiologic study and catheter ablation of typical atrial flutter with a  total of 9 RF energy  applications delivered to the usual atrial flutter isthmus resulting in  termination of atrial flutter, restoration of sinus rhythm, and the creation  of bidirectional block in the atrial flutter isthmus.  ______________________________  Doylene Canning. Ladona Ridgel, M.D.     GWT/MEDQ  D:  05/12/2005  T:  05/13/2005  Job:  [id number]   cc:   Valetta Mole. Swords, M.D. [facility]  [address] [place]  [place] [address]   W. Danella Maiers, M.D.  Fax: [phone]   E. Graceann Congress, M.D.  [address]  [address]  [place]  [place] [address]

## 2010-10-25 NOTE — Op Note (Signed)
[name], [name]                            ACCOUNT NO.:  [id number]   MEDICAL RECORD NO.:  [id number]                   PATIENT TYPE:  INP   LOCATION:  3303                                 FACILITY:  [facility]   PHYSICIAN:  Di Kindle. [name], M.D.        DATE OF BIRTH:  July 06, 1931   DATE OF PROCEDURE:  02/10/2002  DATE OF DISCHARGE:                                 OPERATIVE REPORT   PREOPERATIVE DIAGNOSIS:  Acute arterial occlusion of the right lower  extremity.   POSTOPERATIVE DIAGNOSIS:  Acute arterial occlusion of the right lower  extremity.   PROCEDURE:  Right femoral embolectomy with intraoperative arteriogram x two.   SURGEON:  Di Kindle. [name], M.D.   ASSISTANT:  Loura Pardon, P.A.   ANESTHESIA:  General.   INDICATIONS:  This is a 77 year old gentleman who developed the sudden onset  of right lower extremity pain and paresthesias at approximately 10:00 a.m.  this morning.  He presented to the emergency department and was found to  have an absent right femoral pulse and no Doppler flow on the right foot.  He really had no obvious source for embolization and it was felt that he  probably had some iliac occlusive disease and may have thrombosed this.  He  had a normal left femoral pulse.  He underwent arteriogram which showed a  long segment occlusion of the iliac artery on the right starting at the  common iliac artery and extending into the external iliac artery.  There was  reconstitution of the deep and superficial femoral arteries.  He was taken  urgently to the operating room for femoral thrombectomy.  The procedure and  potential complications were discussed with him and his wife preoperatively.   TECHNIQUE:  The patient was taken to the operating room and received a  general anesthetic.  We were unable to place a catheter because of a history  of a stricture and Dr. Vernie Ammons was able to successfully place a catheter.  This will be dictated separately.   The entire right lower extremity and  right groin was prepped and draped in the usual sterile fashion as was the  left groin in case he needed fem-fem bypass graft.   A longitudinal incision was made in the right groin through which the common  femoral, superficial and deep femoral arteries were all dissected free and  controlled with vessel loops.  The patient received 7000 units of IV  heparin.   A longitudinal arteriotomy was made in the common femoral artery and the  Fogarty catheter was passed down the superficial femoral artery although it  only went a short distance.  It appeared to be chronically occluded.  The  catheter went into the deep femoral artery easily and there was good back-  bleeding from both the superficial and deep femoral arteries.   Next a proximal thrombectomy was performed and a large amount of clot was  retrieved.  This appeared to be fairly fresh clot and not organized clot.  The artery was then flushed with heparinized saline and clamped.  A short  piece of vein had previously been harvested and this was opened  longitudinally to be used as a vein patch.  This a sewn using continuous 6-0  Prolene suture.  Prior to completing closure, the vessels were back-bled and  flushed appropriately and the anastomosis completed.  Flow was re-  established to the right leg and at completion there was a posterior tibial  and peroneal signal with the Doppler.  I shot two intraoperative  arteriograms.  The first one was without inflow occlusion and there was very  poor visualization so another one was repeated with inflow occlusion and it  appeared that there was chronic occlusion of the superficial femoral artery  with reconstitution of the popliteal artery with extensive collaterals.  There was two-vessel runoff via the posterior tibial and peroneal arteries.  Hemostasis was obtained in the wound.  The heparin was not reversed.  A 15-  blade drain was placed.  The wound  was closed with a deep layer of 3-0  Vicryl, subcutaneous layer of 3-0 Vicryl and the skin closed with a 4-0  subcuticular stitch.  A sterile dressing was applied.  The patient tolerated  the procedure well and was transferred to the recovery room in satisfactory  condition.  All needle and sponge counts were correct.                                               Di Kindle. [name], M.D.    CSD/MEDQ  D:  02/10/2002  T:  02/11/2002  Job:  [id number]   cc:   Di Kindle. [name], M.D.  [address]  [place]  [place] [address]  Fax: [phone]

## 2010-10-31 ENCOUNTER — Telehealth: Payer: Self-pay | Admitting: Internal Medicine

## 2010-10-31 NOTE — Telephone Encounter (Signed)
All Cardiac faxed to [facility] @ [phone]  10/30/10/km

## 2010-11-13 ENCOUNTER — Ambulatory Visit (INDEPENDENT_AMBULATORY_CARE_PROVIDER_SITE_OTHER): Payer: Medicare Other | Admitting: *Deleted

## 2010-11-13 ENCOUNTER — Other Ambulatory Visit: Payer: Self-pay | Admitting: Oncology

## 2010-11-13 ENCOUNTER — Encounter: Payer: Self-pay | Admitting: Internal Medicine

## 2010-11-13 ENCOUNTER — Encounter (HOSPITAL_BASED_OUTPATIENT_CLINIC_OR_DEPARTMENT_OTHER): Payer: Medicare Other | Admitting: Oncology

## 2010-11-13 ENCOUNTER — Ambulatory Visit (INDEPENDENT_AMBULATORY_CARE_PROVIDER_SITE_OTHER): Payer: Medicare Other | Admitting: Internal Medicine

## 2010-11-13 DIAGNOSIS — M069 Rheumatoid arthritis, unspecified: Secondary | ICD-10-CM

## 2010-11-13 DIAGNOSIS — I4891 Unspecified atrial fibrillation: Secondary | ICD-10-CM

## 2010-11-13 DIAGNOSIS — E785 Hyperlipidemia, unspecified: Secondary | ICD-10-CM

## 2010-11-13 DIAGNOSIS — D638 Anemia in other chronic diseases classified elsewhere: Secondary | ICD-10-CM

## 2010-11-13 DIAGNOSIS — C61 Malignant neoplasm of prostate: Secondary | ICD-10-CM

## 2010-11-13 DIAGNOSIS — Z8679 Personal history of other diseases of the circulatory system: Secondary | ICD-10-CM

## 2010-11-13 DIAGNOSIS — I2510 Atherosclerotic heart disease of native coronary artery without angina pectoris: Secondary | ICD-10-CM

## 2010-11-13 DIAGNOSIS — D649 Anemia, unspecified: Secondary | ICD-10-CM

## 2010-11-13 DIAGNOSIS — C3490 Malignant neoplasm of unspecified part of unspecified bronchus or lung: Secondary | ICD-10-CM

## 2010-11-13 LAB — CBC WITH DIFFERENTIAL/PLATELET
BASO%: 1.7 % (ref 0.0–2.0)
Basophils Absolute: 0.1 10*3/uL (ref 0.0–0.1)
EOS%: 3.8 % (ref 0.0–7.0)
HCT: 27.2 % — ABNORMAL LOW (ref 38.4–49.9)
HGB: 8.8 g/dL — ABNORMAL LOW (ref 13.0–17.1)
MCH: 30 pg (ref 27.2–33.4)
MCHC: 32.1 g/dL (ref 32.0–36.0)
MCV: 93.3 fL (ref 79.3–98.0)
MONO%: 10.9 % (ref 0.0–14.0)
NEUT%: 57.9 % (ref 39.0–75.0)
RDW: 14.9 % — ABNORMAL HIGH (ref 11.0–14.6)

## 2010-11-13 LAB — POCT INR: INR: 3.8

## 2010-11-13 NOTE — Assessment & Plan Note (Addendum)
Patient has regular followup with oncology. He has regular monitoring of laboratory work at the oncology office. I will not repeat lab work.

## 2010-11-13 NOTE — Assessment & Plan Note (Signed)
Well controlled. Continue current medications  

## 2010-11-13 NOTE — Assessment & Plan Note (Signed)
Patient is not having any symptoms. Will continue risk factor modification.

## 2010-11-13 NOTE — Assessment & Plan Note (Signed)
Likely multifactorial. Note he is on warfarin. He will have followup with the oncology service later today.

## 2010-11-13 NOTE — Progress Notes (Signed)
  Subjective:    Patient ID: [name], male    DOB: [birth year], 77 y.o.   MRN: [id number]  HPI  Lung cancer---followed by dr Clelia Croft..has laboratory work scheduled Renal insuff---check labs regularly Anemia---followed by dr Clelia Croft Chronic anticoagulation---followed at cardiology AFIB---no palpitations, no neurologic deficits Lipids---tolerating meds without difficulty RA---has not seen rheumatologist recently. Currently on no meds  Past Medical History  Diagnosis Date  . CARCINOMA, LUNG, SQUAMOUS CELL 08/21/2008  . HYPERLIPIDEMIA 11/13/2006  . ANEMIA 09/07/2009  . MYOCARDIAL INFARCTION, HX OF 11/13/2006  . CORONARY ARTERY DISEASE 11/13/2006  . Atrial fibrillation 11/13/2006  . CONGESTIVE HEART FAILURE 02/01/2007  . CAROTID ARTERY STENOSIS, WITHOUT INFARCTION 09/07/2008  . KIDNEY DISEASE, CHRONIC, STAGE II 10/13/2007  . Rheumatoid arthritis 11/13/2006  . OSTEOPENIA 11/13/2006  . PROSTATE SPECIFIC ANTIGEN, ELEVATED 10/24/2009  . TRANSIENT ISCHEMIC ATTACK, HX OF 11/13/2006  . COLONIC POLYPS, HX OF 07/04/2003  . Femoral artery occlusion     right   Past Surgical History  Procedure Date  . Angioplasty 1995  . Embolectomy   . Femoral artery - popliteal artery bypass graft 06/21/08    rt bellow-knee  . Coronary artery bypass graft 01/31/02  . Cardiac electrophysiology study and ablation     radiofrequency catheter of atrial flutter  . Lobectomy 2009    right lower  . Bilateral vats ablation 2009    right minithoracotomy    reports that he quit smoking about 9 years ago. He does not have any smokeless tobacco history on file. He reports that he does not drink alcohol or use illicit drugs. family history includes Tuberculosis in his mother. No Known Allergies   Review of Systems  patient denies chest pain, shortness of breath, orthopnea. Denies lower extremity edema, abdominal pain, change in appetite, change in bowel movements. Patient denies rashes, musculoskeletal complaints. No other  specific complaints in a complete review of systems.  Notes occasional, minimal epistaxis    Objective:   Physical Exam  well-developed well-nourished male in no acute distress. HEENT exam atraumatic, normocephalic, neck supple without jugular venous distention. Chest clear to auscultation cardiac exam S1-S2 are regular. Abdominal exam overweight with bowel sounds, soft and nontender. Extremities no edema. 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 No tenosynovitis. Neurologic exam is alert with a normal gait.      Assessment & Plan:

## 2010-11-13 NOTE — Assessment & Plan Note (Signed)
Patient has regular followup with the warfarin clinic. He has had intermittent epistaxis. I advised him to make the warfarin clinic aware of his epistaxis. He may need ENT referral. I will leave this up to the warfarin clinic providers.

## 2010-12-03 ENCOUNTER — Ambulatory Visit (INDEPENDENT_AMBULATORY_CARE_PROVIDER_SITE_OTHER): Payer: Medicare Other | Admitting: *Deleted

## 2010-12-03 DIAGNOSIS — Z8679 Personal history of other diseases of the circulatory system: Secondary | ICD-10-CM

## 2010-12-03 DIAGNOSIS — I4891 Unspecified atrial fibrillation: Secondary | ICD-10-CM

## 2010-12-09 ENCOUNTER — Other Ambulatory Visit (HOSPITAL_COMMUNITY): Payer: Self-pay | Admitting: Urology

## 2010-12-09 DIAGNOSIS — C61 Malignant neoplasm of prostate: Secondary | ICD-10-CM

## 2010-12-12 ENCOUNTER — Other Ambulatory Visit: Payer: Self-pay | Admitting: Oncology

## 2010-12-12 ENCOUNTER — Encounter (HOSPITAL_BASED_OUTPATIENT_CLINIC_OR_DEPARTMENT_OTHER): Payer: Medicare Other | Admitting: Oncology

## 2010-12-12 DIAGNOSIS — D638 Anemia in other chronic diseases classified elsewhere: Secondary | ICD-10-CM

## 2010-12-12 DIAGNOSIS — M069 Rheumatoid arthritis, unspecified: Secondary | ICD-10-CM

## 2010-12-12 DIAGNOSIS — C61 Malignant neoplasm of prostate: Secondary | ICD-10-CM

## 2010-12-12 LAB — CBC WITH DIFFERENTIAL/PLATELET
BASO%: 1.9 % (ref 0.0–2.0)
EOS%: 5.3 % (ref 0.0–7.0)
LYMPH%: 31.9 % (ref 14.0–49.0)
MCHC: 32.2 g/dL (ref 32.0–36.0)
MCV: 88.2 fL (ref 79.3–98.0)
MONO%: 12.2 % (ref 0.0–14.0)
Platelets: 352 10*3/uL (ref 140–400)
RBC: 3.57 10*6/uL — ABNORMAL LOW (ref 4.20–5.82)
RDW: 15.2 % — ABNORMAL HIGH (ref 11.0–14.6)

## 2010-12-18 ENCOUNTER — Encounter (HOSPITAL_COMMUNITY): Payer: Self-pay

## 2010-12-18 ENCOUNTER — Encounter (HOSPITAL_COMMUNITY)
Admission: RE | Admit: 2010-12-18 | Discharge: 2010-12-18 | Disposition: A | Discharge status: Home / Self Care | Payer: Medicare Other | Source: Ambulatory Visit | Attending: Urology | Admitting: Urology

## 2010-12-18 DIAGNOSIS — C61 Malignant neoplasm of prostate: Secondary | ICD-10-CM | POA: Insufficient documentation

## 2010-12-18 DIAGNOSIS — Z85118 Personal history of other malignant neoplasm of bronchus and lung: Secondary | ICD-10-CM | POA: Insufficient documentation

## 2010-12-18 MED ORDER — TECHNETIUM TC 99M MEDRONATE IV KIT
24.0000 | PACK | Freq: Once | INTRAVENOUS | Status: AC | PRN
  Administered 2010-12-18: 24 via INTRAVENOUS

## 2010-12-31 ENCOUNTER — Ambulatory Visit (INDEPENDENT_AMBULATORY_CARE_PROVIDER_SITE_OTHER): Payer: Medicare Other | Admitting: *Deleted

## 2010-12-31 DIAGNOSIS — I4891 Unspecified atrial fibrillation: Secondary | ICD-10-CM

## 2010-12-31 DIAGNOSIS — Z8679 Personal history of other diseases of the circulatory system: Secondary | ICD-10-CM

## 2010-12-31 LAB — POCT INR: INR: 4.6

## 2011-01-08 ENCOUNTER — Other Ambulatory Visit: Payer: Self-pay | Admitting: Oncology

## 2011-01-08 ENCOUNTER — Encounter (HOSPITAL_BASED_OUTPATIENT_CLINIC_OR_DEPARTMENT_OTHER): Payer: Medicare Other | Admitting: Oncology

## 2011-01-08 DIAGNOSIS — C61 Malignant neoplasm of prostate: Secondary | ICD-10-CM

## 2011-01-08 LAB — CBC WITH DIFFERENTIAL/PLATELET
Eosinophils Absolute: 0.2 10*3/uL (ref 0.0–0.5)
MONO#: 0.8 10*3/uL (ref 0.1–0.9)
NEUT#: 2.8 10*3/uL (ref 1.5–6.5)
RBC: 4.05 10*6/uL — ABNORMAL LOW (ref 4.20–5.82)
RDW: 16.3 % — ABNORMAL HIGH (ref 11.0–14.6)
WBC: 5.3 10*3/uL (ref 4.0–10.3)
lymph#: 1.5 10*3/uL (ref 0.9–3.3)

## 2011-01-13 ENCOUNTER — Ambulatory Visit
Admission: RE | Admit: 2011-01-13 | Discharge: 2011-01-13 | Disposition: A | Discharge status: Home / Self Care | Payer: Medicare Other | Source: Ambulatory Visit | Attending: Radiation Oncology | Admitting: Radiation Oncology

## 2011-01-13 DIAGNOSIS — R351 Nocturia: Secondary | ICD-10-CM | POA: Insufficient documentation

## 2011-01-13 DIAGNOSIS — C61 Malignant neoplasm of prostate: Secondary | ICD-10-CM | POA: Insufficient documentation

## 2011-01-13 DIAGNOSIS — Z510 Encounter for antineoplastic radiation therapy: Secondary | ICD-10-CM | POA: Insufficient documentation

## 2011-01-16 ENCOUNTER — Ambulatory Visit (INDEPENDENT_AMBULATORY_CARE_PROVIDER_SITE_OTHER): Payer: Medicare Other | Admitting: *Deleted

## 2011-01-16 DIAGNOSIS — Z8679 Personal history of other diseases of the circulatory system: Secondary | ICD-10-CM

## 2011-01-16 DIAGNOSIS — I4891 Unspecified atrial fibrillation: Secondary | ICD-10-CM

## 2011-01-16 LAB — POCT INR: INR: 3.8

## 2011-01-22 ENCOUNTER — Telehealth: Payer: Self-pay | Admitting: Internal Medicine

## 2011-01-22 NOTE — Telephone Encounter (Signed)
Forwarded to Dr. Gessner for Review. °

## 2011-01-27 ENCOUNTER — Encounter: Payer: Self-pay | Admitting: Internal Medicine

## 2011-01-27 ENCOUNTER — Ambulatory Visit (INDEPENDENT_AMBULATORY_CARE_PROVIDER_SITE_OTHER): Payer: Medicare Other | Admitting: Internal Medicine

## 2011-01-27 VITALS — BP 100/48 | HR 64 | Ht 69.0 in | Wt 145.4 lb

## 2011-01-27 DIAGNOSIS — C61 Malignant neoplasm of prostate: Secondary | ICD-10-CM

## 2011-01-27 DIAGNOSIS — K649 Unspecified hemorrhoids: Secondary | ICD-10-CM

## 2011-01-27 DIAGNOSIS — K625 Hemorrhage of anus and rectum: Secondary | ICD-10-CM

## 2011-01-27 DIAGNOSIS — Z7901 Long term (current) use of anticoagulants: Secondary | ICD-10-CM

## 2011-01-27 NOTE — Patient Instructions (Addendum)
I think the rectal bleeding was the result of hemorrhoids and your blood being too thin from warfarin. I would not do any further testing now but if there is more bleeding, let me know. Please read the hemorrhoid handout.  Iva Boop, MD, Clementeen Graham

## 2011-01-27 NOTE — Progress Notes (Signed)
  Subjective:    Patient ID: [name], male    DOB: Oct 21, 1931, 77 y.o.   MRN: [id number]  HPI3-4 weeks after he had his prostate biopsy he noticed 3-4 days of painless bright red blood per rectum. Early August occurrence. No constipation or diarrhea associated with that. Seeped into underwear, was on toilet paper and was in the toilet bowel also. Has not recurred since - not before either though maybe a spot of blood at times. His INR was 4.6 a few days before this occurred, warfarin modified but repeat INR was 3.8 on Aug 9. To have marking and then XRT in next 1-2 weeks.   Review of Systems Some arthritis symptoms (RA), appetite less in heat and weight is down     Objective:   Physical Exam Thin, NAD Rectal: slightly reduced resting tone, no mass, brown stool. ANOSCOPY: small internal hemorrhoids in anal canal      Assessment & Plan:

## 2011-01-27 NOTE — Assessment & Plan Note (Signed)
I think his rectal bleeding was due to these. His INR was > 4 right before this and likely related and causative. Given no recurrence and the overall scenario, do not think additional work-up needed. He has had a colonoscopy as recently as 09/2009. Hemorrhoids not seen then but visible on anoscopy today. His anemia is chronic x years and though there is iron deficiency, also has chronic disease components like RA and ESRD.  Would proceed with XRT. He could get radiation proctitis and I explained. If he does have recurrent bleeding will reassess need for endoscopic evaluation.

## 2011-01-28 ENCOUNTER — Telehealth: Payer: Self-pay | Admitting: Pharmacist

## 2011-01-28 NOTE — Telephone Encounter (Signed)
Spoke with pt.  He has appt with Coumadin clinic on 8/24.  Will take last dose of Coumadin on 8/23 and will dose Lovenox at that time.

## 2011-01-28 NOTE — Telephone Encounter (Signed)
Message copied by Velda Shell on Tue Jan 28, 2011  8:58 AM ------      Message from: Tonny Bollman      Created: Wed Jan 22, 2011  9:33 PM       Yes I think with TIA history, bridging with lovenox is warranted. thx Kennon Rounds      ----- Message -----         From: Mariane Masters, PHARMD         Sent: 01/21/2011  11:15 AM           To: Micheline Chapman, MD            Pt having radioactive pellets placed in prostate on 8/28.  Needs to hold Coumadin.  Has history of TIAs.  Would you like [place] to bridge pt? Thanks.

## 2011-01-30 ENCOUNTER — Other Ambulatory Visit: Payer: Self-pay | Admitting: Cardiology

## 2011-01-30 ENCOUNTER — Encounter: Payer: Medicare Other | Admitting: *Deleted

## 2011-01-30 DIAGNOSIS — I6529 Occlusion and stenosis of unspecified carotid artery: Secondary | ICD-10-CM

## 2011-01-31 ENCOUNTER — Encounter (INDEPENDENT_AMBULATORY_CARE_PROVIDER_SITE_OTHER): Payer: Medicare Other | Admitting: *Deleted

## 2011-01-31 ENCOUNTER — Ambulatory Visit (INDEPENDENT_AMBULATORY_CARE_PROVIDER_SITE_OTHER): Payer: Medicare Other | Admitting: *Deleted

## 2011-01-31 DIAGNOSIS — Z8679 Personal history of other diseases of the circulatory system: Secondary | ICD-10-CM

## 2011-01-31 DIAGNOSIS — I6529 Occlusion and stenosis of unspecified carotid artery: Secondary | ICD-10-CM

## 2011-01-31 DIAGNOSIS — I4891 Unspecified atrial fibrillation: Secondary | ICD-10-CM

## 2011-01-31 LAB — POCT INR: INR: 3.5

## 2011-02-03 ENCOUNTER — Other Ambulatory Visit: Payer: Self-pay | Admitting: Oncology

## 2011-02-03 ENCOUNTER — Ambulatory Visit (HOSPITAL_COMMUNITY)
Admission: RE | Admit: 2011-02-03 | Discharge: 2011-02-03 | Disposition: A | Discharge status: Home / Self Care | Payer: Medicare Other | Source: Ambulatory Visit | Attending: Oncology | Admitting: Oncology

## 2011-02-03 ENCOUNTER — Encounter (HOSPITAL_BASED_OUTPATIENT_CLINIC_OR_DEPARTMENT_OTHER): Payer: Medicare Other | Admitting: Oncology

## 2011-02-03 DIAGNOSIS — I708 Atherosclerosis of other arteries: Secondary | ICD-10-CM | POA: Insufficient documentation

## 2011-02-03 DIAGNOSIS — I517 Cardiomegaly: Secondary | ICD-10-CM | POA: Insufficient documentation

## 2011-02-03 DIAGNOSIS — C3490 Malignant neoplasm of unspecified part of unspecified bronchus or lung: Secondary | ICD-10-CM

## 2011-02-03 DIAGNOSIS — C3430 Malignant neoplasm of lower lobe, unspecified bronchus or lung: Secondary | ICD-10-CM

## 2011-02-03 DIAGNOSIS — I700 Atherosclerosis of aorta: Secondary | ICD-10-CM | POA: Insufficient documentation

## 2011-02-03 DIAGNOSIS — I701 Atherosclerosis of renal artery: Secondary | ICD-10-CM | POA: Insufficient documentation

## 2011-02-03 DIAGNOSIS — J438 Other emphysema: Secondary | ICD-10-CM | POA: Insufficient documentation

## 2011-02-03 DIAGNOSIS — I2510 Atherosclerotic heart disease of native coronary artery without angina pectoris: Secondary | ICD-10-CM | POA: Insufficient documentation

## 2011-02-03 DIAGNOSIS — K449 Diaphragmatic hernia without obstruction or gangrene: Secondary | ICD-10-CM | POA: Insufficient documentation

## 2011-02-03 DIAGNOSIS — R599 Enlarged lymph nodes, unspecified: Secondary | ICD-10-CM | POA: Insufficient documentation

## 2011-02-03 DIAGNOSIS — N281 Cyst of kidney, acquired: Secondary | ICD-10-CM | POA: Insufficient documentation

## 2011-02-03 DIAGNOSIS — Z8546 Personal history of malignant neoplasm of prostate: Secondary | ICD-10-CM | POA: Insufficient documentation

## 2011-02-03 LAB — CMP (CANCER CENTER ONLY)
ALT(SGPT): 23 U/L (ref 10–47)
AST: 30 U/L (ref 11–38)
Albumin: 3 g/dL — ABNORMAL LOW (ref 3.3–5.5)
Alkaline Phosphatase: 99 U/L — ABNORMAL HIGH (ref 26–84)
Glucose, Bld: 100 mg/dL (ref 73–118)
Potassium: 4.7 mEq/L (ref 3.3–4.7)
Sodium: 132 mEq/L (ref 128–145)
Total Bilirubin: 0.8 mg/dl (ref 0.20–1.60)
Total Protein: 7.9 g/dL (ref 6.4–8.1)

## 2011-02-03 LAB — CBC WITH DIFFERENTIAL/PLATELET
Basophils Absolute: 0.1 10*3/uL (ref 0.0–0.1)
EOS%: 3.7 % (ref 0.0–7.0)
Eosinophils Absolute: 0.2 10*3/uL (ref 0.0–0.5)
HCT: 27.7 % — ABNORMAL LOW (ref 38.4–49.9)
HGB: 8.8 g/dL — ABNORMAL LOW (ref 13.0–17.1)
MCH: 26.6 pg — ABNORMAL LOW (ref 27.2–33.4)
MCV: 83.4 fL (ref 79.3–98.0)
MONO%: 13.6 % (ref 0.0–14.0)
NEUT#: 2.5 10*3/uL (ref 1.5–6.5)
NEUT%: 49 % (ref 39.0–75.0)

## 2011-02-03 MED ORDER — IOHEXOL 300 MG/ML  SOLN
80.0000 mL | Freq: Once | INTRAMUSCULAR | Status: AC | PRN
  Administered 2011-02-03: 80 mL via INTRAVENOUS

## 2011-02-05 ENCOUNTER — Encounter (HOSPITAL_BASED_OUTPATIENT_CLINIC_OR_DEPARTMENT_OTHER): Payer: Medicare Other | Admitting: Oncology

## 2011-02-05 DIAGNOSIS — D649 Anemia, unspecified: Secondary | ICD-10-CM

## 2011-02-05 DIAGNOSIS — C3430 Malignant neoplasm of lower lobe, unspecified bronchus or lung: Secondary | ICD-10-CM

## 2011-02-05 DIAGNOSIS — Z7901 Long term (current) use of anticoagulants: Secondary | ICD-10-CM

## 2011-02-05 DIAGNOSIS — N289 Disorder of kidney and ureter, unspecified: Secondary | ICD-10-CM

## 2011-02-14 ENCOUNTER — Ambulatory Visit (INDEPENDENT_AMBULATORY_CARE_PROVIDER_SITE_OTHER): Payer: Medicare Other | Admitting: *Deleted

## 2011-02-14 DIAGNOSIS — Z8679 Personal history of other diseases of the circulatory system: Secondary | ICD-10-CM

## 2011-02-14 DIAGNOSIS — I4891 Unspecified atrial fibrillation: Secondary | ICD-10-CM

## 2011-03-05 ENCOUNTER — Encounter (HOSPITAL_BASED_OUTPATIENT_CLINIC_OR_DEPARTMENT_OTHER): Payer: Medicare Other | Admitting: Oncology

## 2011-03-05 ENCOUNTER — Other Ambulatory Visit: Payer: Self-pay | Admitting: Medical

## 2011-03-05 DIAGNOSIS — N289 Disorder of kidney and ureter, unspecified: Secondary | ICD-10-CM

## 2011-03-05 DIAGNOSIS — D649 Anemia, unspecified: Secondary | ICD-10-CM

## 2011-03-05 DIAGNOSIS — C61 Malignant neoplasm of prostate: Secondary | ICD-10-CM

## 2011-03-05 LAB — CBC WITH DIFFERENTIAL/PLATELET
BASO%: 2.8 % — ABNORMAL HIGH (ref 0.0–2.0)
HCT: 29.5 % — ABNORMAL LOW (ref 38.4–49.9)
LYMPH%: 30.5 % (ref 14.0–49.0)
MCH: 27.3 pg (ref 27.2–33.4)
MCHC: 32.1 g/dL (ref 32.0–36.0)
MCV: 84.8 fL (ref 79.3–98.0)
MONO#: 0.7 10*3/uL (ref 0.1–0.9)
MONO%: 15.4 % — ABNORMAL HIGH (ref 0.0–14.0)
NEUT%: 45.7 % (ref 39.0–75.0)
Platelets: 297 10*3/uL (ref 140–400)
RBC: 3.48 10*6/uL — ABNORMAL LOW (ref 4.20–5.82)
WBC: 4.4 10*3/uL (ref 4.0–10.3)

## 2011-03-07 ENCOUNTER — Ambulatory Visit (INDEPENDENT_AMBULATORY_CARE_PROVIDER_SITE_OTHER): Payer: Medicare Other | Admitting: *Deleted

## 2011-03-07 DIAGNOSIS — I4891 Unspecified atrial fibrillation: Secondary | ICD-10-CM

## 2011-03-07 DIAGNOSIS — Z8679 Personal history of other diseases of the circulatory system: Secondary | ICD-10-CM

## 2011-03-07 LAB — POCT I-STAT, CHEM 8
BUN: 33 — ABNORMAL HIGH
Calcium, Ion: 1.25
Creatinine, Ser: 1.5
Glucose, Bld: 90
Hemoglobin: 11.6 — ABNORMAL LOW
Sodium: 139
TCO2: 25

## 2011-03-07 LAB — POCT INR: INR: 4.1

## 2011-03-07 LAB — APTT: aPTT: 32

## 2011-03-10 LAB — COMPREHENSIVE METABOLIC PANEL
ALT: 12
AST: 14
Calcium: 9.6
Creatinine, Ser: 1.78 — ABNORMAL HIGH
GFR calc Af Amer: 45 — ABNORMAL LOW
GFR calc non Af Amer: 38 — ABNORMAL LOW
Sodium: 136
Total Protein: 6.8

## 2011-03-10 LAB — URINALYSIS, ROUTINE W REFLEX MICROSCOPIC
Bilirubin Urine: NEGATIVE
Glucose, UA: NEGATIVE
Ketones, ur: NEGATIVE
Nitrite: NEGATIVE
Protein, ur: NEGATIVE
pH: 5

## 2011-03-10 LAB — ABO/RH: ABO/RH(D): A POS

## 2011-03-10 LAB — APTT: aPTT: 33

## 2011-03-10 LAB — TYPE AND SCREEN
ABO/RH(D): A POS
Antibody Screen: NEGATIVE

## 2011-03-10 LAB — CBC
MCHC: 31.8
MCV: 91.4
Platelets: 262
RDW: 14.3

## 2011-03-11 LAB — CBC
HCT: 30.1 — ABNORMAL LOW
HCT: 31.4 — ABNORMAL LOW
HCT: 33 — ABNORMAL LOW
Hemoglobin: 10.7 — ABNORMAL LOW
Hemoglobin: 9.1 — ABNORMAL LOW
Hemoglobin: 9.9 — ABNORMAL LOW
MCHC: 32.4
MCHC: 32.4
MCHC: 32.8
MCV: 91.9
MCV: 92.2
MCV: 92.3
Platelets: 198
Platelets: 212
Platelets: 220
Platelets: 280
RBC: 3.05 — ABNORMAL LOW
RBC: 3.26 — ABNORMAL LOW
RDW: 13.8
RDW: 14.3
WBC: 10
WBC: 10.6 — ABNORMAL HIGH
WBC: 12.2 — ABNORMAL HIGH

## 2011-03-11 LAB — POCT I-STAT 3, ART BLOOD GAS (G3+)
Bicarbonate: 23.4
O2 Saturation: 93
Patient temperature: 100.1
TCO2: 24
pCO2 arterial: 33.9 — ABNORMAL LOW
pH, Arterial: 7.451 — ABNORMAL HIGH

## 2011-03-11 LAB — BASIC METABOLIC PANEL
BUN: 9
Calcium: 8.5
Chloride: 100
Chloride: 97
Creatinine, Ser: 1.03
GFR calc Af Amer: >60
GFR calc Af Amer: >60
GFR calc non Af Amer: 58 — ABNORMAL LOW
Potassium: 4.6
Sodium: 132 — ABNORMAL LOW

## 2011-03-11 LAB — APTT: aPTT: 28

## 2011-03-11 LAB — CROSSMATCH
ABO/RH(D): A POS
Antibody Screen: NEGATIVE

## 2011-03-11 LAB — COMPREHENSIVE METABOLIC PANEL
Albumin: 2.6 — ABNORMAL LOW
Albumin: 3.8
Alkaline Phosphatase: 42
BUN: 27 — ABNORMAL HIGH
BUN: 9
Calcium: 8.2 — ABNORMAL LOW
Calcium: 9.5
Creatinine, Ser: 1.1
Glucose, Bld: 106 — ABNORMAL HIGH
Glucose, Bld: 123 — ABNORMAL HIGH
Potassium: 4.2
Potassium: 5.6 — ABNORMAL HIGH
Total Protein: 5.4 — ABNORMAL LOW
Total Protein: 7.3

## 2011-03-11 LAB — PROTIME-INR
INR: 1.3
INR: 1.3
INR: 1.6 — ABNORMAL HIGH
Prothrombin Time: 16.6 — ABNORMAL HIGH
Prothrombin Time: 16.8 — ABNORMAL HIGH
Prothrombin Time: 19.4 — ABNORMAL HIGH

## 2011-03-14 ENCOUNTER — Encounter: Payer: Self-pay | Admitting: Internal Medicine

## 2011-03-14 ENCOUNTER — Ambulatory Visit (INDEPENDENT_AMBULATORY_CARE_PROVIDER_SITE_OTHER): Payer: Medicare Other | Admitting: Internal Medicine

## 2011-03-14 VITALS — BP 118/64 | HR 80 | Temp 97.9°F | Wt 150.0 lb

## 2011-03-14 DIAGNOSIS — Z7901 Long term (current) use of anticoagulants: Secondary | ICD-10-CM

## 2011-03-14 DIAGNOSIS — I4891 Unspecified atrial fibrillation: Secondary | ICD-10-CM

## 2011-03-14 DIAGNOSIS — E785 Hyperlipidemia, unspecified: Secondary | ICD-10-CM

## 2011-03-14 DIAGNOSIS — C61 Malignant neoplasm of prostate: Secondary | ICD-10-CM

## 2011-03-14 DIAGNOSIS — Z23 Encounter for immunization: Secondary | ICD-10-CM

## 2011-03-14 DIAGNOSIS — M069 Rheumatoid arthritis, unspecified: Secondary | ICD-10-CM

## 2011-03-14 NOTE — Assessment & Plan Note (Signed)
Has regular f/u with cardiology and coumadin clinic

## 2011-03-14 NOTE — Assessment & Plan Note (Signed)
Has had recent LFTs No further labs today

## 2011-03-14 NOTE — Assessment & Plan Note (Signed)
Currently undergoing prostate rdiation 40 treatment days planned

## 2011-03-14 NOTE — Assessment & Plan Note (Signed)
Has not seen rheumatology in quite some time

## 2011-03-16 NOTE — Progress Notes (Signed)
  Subjective:    Patient ID: [name], male    DOB: January 18, 1932, 77 y.o.   MRN: [id number]  HPI  Patient comes in for followup. Patient has atrial fibrillation. Tolerating warfarin.  Patient has prostate cancer.  Patient with rheumatoid arthritis has not followup with rheumatology. Not taking methotrexate at this time. He is having significant joint pain of wrist and fingers.  Past Medical History  Diagnosis Date  . CARCINOMA, LUNG, SQUAMOUS CELL 08/21/2008  . HYPERLIPIDEMIA 11/13/2006  . ANEMIA 09/07/2009  . MYOCARDIAL INFARCTION, HX OF 11/13/2006  . CORONARY ARTERY DISEASE 11/13/2006  . Atrial fibrillation 11/13/2006  . CONGESTIVE HEART FAILURE 02/01/2007  . CAROTID ARTERY STENOSIS, WITHOUT INFARCTION 09/07/2008  . KIDNEY DISEASE, CHRONIC, STAGE II 10/13/2007  . Rheumatoid arthritis 11/13/2006  . OSTEOPENIA 11/13/2006  . Cancer of prostate 10/24/2009  . TRANSIENT ISCHEMIC ATTACK, HX OF 11/13/2006  . COLONIC POLYPS, HX OF 07/04/2003    6 mm adenoma  . Femoral artery occlusion     right  . PAD (peripheral artery disease)    Past Surgical History  Procedure Date  . Angioplasty 1995  . Embolectomy   . Femoral artery - popliteal artery bypass graft 06/21/08    rt bellow-knee  . Coronary artery bypass graft 01/31/02  . Cardiac electrophysiology study and ablation     radiofrequency catheter of atrial flutter  . Lobectomy 2009    right lower  . Bilateral vats ablation 2009    right minithoracotomy  . Colonoscopy w/ biopsies and polypectomy 06/2003, 09/2009    tubular adenoma, diverticulosis,   . Esophagogastroduodenoscopy 09/2009    2 gastric antral ulcers, antral gastritis    reports that he quit smoking about 9 years ago. He has never used smokeless tobacco. He reports that he does not drink alcohol or use illicit drugs. family history includes Tuberculosis in his mother. No Known Allergies   Review of Systems     patient denies chest pain, shortness of breath, orthopnea. Denies lower  extremity edema, abdominal pain, change in appetite, change in bowel movements. Patient denies rashes, musculoskeletal complaints. No other specific complaints in a complete review of systems.   Objective:   Physical Exam  well-developed well-nourished male in no acute distress. HEENT exam atraumatic, normocephalic, neck supple without jugular venous distention. Chest clear to auscultation cardiac exam S1-S2 are regular. Abdominal exam overweight with bowel sounds, soft and nontender. Extremities no edema. Neurologic exam is alert with a normal gait.       Assessment & Plan:

## 2011-03-16 NOTE — Assessment & Plan Note (Signed)
Continue warfarin therapy.

## 2011-03-20 ENCOUNTER — Encounter: Payer: Self-pay | Admitting: *Deleted

## 2011-03-21 ENCOUNTER — Ambulatory Visit (INDEPENDENT_AMBULATORY_CARE_PROVIDER_SITE_OTHER): Payer: Medicare Other | Admitting: *Deleted

## 2011-03-21 DIAGNOSIS — Z8679 Personal history of other diseases of the circulatory system: Secondary | ICD-10-CM

## 2011-03-21 DIAGNOSIS — I4891 Unspecified atrial fibrillation: Secondary | ICD-10-CM

## 2011-03-28 ENCOUNTER — Other Ambulatory Visit: Payer: Self-pay | Admitting: Cardiovascular Disease

## 2011-03-28 MED ORDER — WARFARIN SODIUM 5 MG PO TABS: ORAL_TABLET | ORAL | Status: DC

## 2011-04-02 ENCOUNTER — Other Ambulatory Visit: Payer: Self-pay | Admitting: Medical

## 2011-04-02 ENCOUNTER — Encounter: Payer: Self-pay | Admitting: *Deleted

## 2011-04-02 ENCOUNTER — Encounter (HOSPITAL_BASED_OUTPATIENT_CLINIC_OR_DEPARTMENT_OTHER): Payer: Medicare Other | Admitting: Oncology

## 2011-04-02 DIAGNOSIS — Z85118 Personal history of other malignant neoplasm of bronchus and lung: Secondary | ICD-10-CM

## 2011-04-02 DIAGNOSIS — D649 Anemia, unspecified: Secondary | ICD-10-CM

## 2011-04-02 DIAGNOSIS — C61 Malignant neoplasm of prostate: Secondary | ICD-10-CM

## 2011-04-02 LAB — CBC WITH DIFFERENTIAL/PLATELET
Basophils Absolute: 0.1 10*3/uL (ref 0.0–0.1)
EOS%: 8.8 % — ABNORMAL HIGH (ref 0.0–7.0)
HCT: 31.9 % — ABNORMAL LOW (ref 38.4–49.9)
HGB: 10.1 g/dL — ABNORMAL LOW (ref 13.0–17.1)
MCH: 27 pg — ABNORMAL LOW (ref 27.2–33.4)
MCV: 85.5 fL (ref 79.3–98.0)
MONO%: 12.5 % (ref 0.0–14.0)
NEUT%: 42.3 % (ref 39.0–75.0)
Platelets: 292 10*3/uL (ref 140–400)

## 2011-04-14 ENCOUNTER — Ambulatory Visit
Admission: RE | Admit: 2011-04-14 | Discharge: 2011-04-14 | Disposition: A | Discharge status: Home / Self Care | Payer: Medicare Other | Source: Ambulatory Visit | Admitting: Radiation Oncology

## 2011-04-14 ENCOUNTER — Ambulatory Visit
Admission: RE | Admit: 2011-04-14 | Discharge: 2011-04-14 | Disposition: A | Discharge status: Home / Self Care | Payer: Medicare Other | Source: Ambulatory Visit | Attending: Radiation Oncology | Admitting: Radiation Oncology

## 2011-04-14 DIAGNOSIS — Z510 Encounter for antineoplastic radiation therapy: Secondary | ICD-10-CM | POA: Insufficient documentation

## 2011-04-14 DIAGNOSIS — C61 Malignant neoplasm of prostate: Secondary | ICD-10-CM | POA: Insufficient documentation

## 2011-04-14 NOTE — Progress Notes (Signed)
DIAGNOSIS:  Prostate cancer.  NARRATIVE:  Mr. Nylen is seen today for weekly assessment.  He has completed 38 of 40 planned treatments directed at the prostate area (7410 cGy of a planned 7800 cGy).  The patient has some very mild dysuria upon initiation of urination.  He denies any hematuria or bowel complaints.  The patient's nocturia is actually down a little bit this week, down to 1 to 2.  The patient denies any hematuria.  PHYSICAL EXAMINATION:  General:  The patient's weight is stable at 148. Abdomen:  Soft and nontender with normal bowel sounds.  IMPRESSION AND PLAN:  The patient is tolerating his radiation treatments well at this time.  The patient's radiation fields are setting up accurately.  The patient's radiation chart was checked today.  The plan is to continue to a cumulative dose of 7800 cGy.    ______________________________ Billie Lade, Ph.D., M.D. JDK/MEDQ  D:  04/14/2011  T:  04/14/2011  Job:  [id number]

## 2011-04-15 ENCOUNTER — Ambulatory Visit
Admission: RE | Admit: 2011-04-15 | Discharge: 2011-04-15 | Disposition: A | Discharge status: Home / Self Care | Payer: Medicare Other | Source: Ambulatory Visit | Attending: Radiation Oncology | Admitting: Radiation Oncology

## 2011-04-16 ENCOUNTER — Ambulatory Visit
Admission: RE | Admit: 2011-04-16 | Discharge: 2011-04-16 | Disposition: A | Discharge status: Home / Self Care | Payer: Medicare Other | Source: Ambulatory Visit | Attending: Radiation Oncology | Admitting: Radiation Oncology

## 2011-04-17 ENCOUNTER — Ambulatory Visit: Payer: Medicare Other | Admitting: Cardiovascular Disease

## 2011-04-18 ENCOUNTER — Encounter: Payer: Self-pay | Admitting: Cardiovascular Disease

## 2011-04-18 ENCOUNTER — Ambulatory Visit (INDEPENDENT_AMBULATORY_CARE_PROVIDER_SITE_OTHER): Payer: Medicare Other | Admitting: *Deleted

## 2011-04-18 ENCOUNTER — Ambulatory Visit (INDEPENDENT_AMBULATORY_CARE_PROVIDER_SITE_OTHER): Payer: Medicare Other | Admitting: Cardiovascular Disease

## 2011-04-18 DIAGNOSIS — E785 Hyperlipidemia, unspecified: Secondary | ICD-10-CM

## 2011-04-18 DIAGNOSIS — Z8679 Personal history of other diseases of the circulatory system: Secondary | ICD-10-CM

## 2011-04-18 DIAGNOSIS — I6529 Occlusion and stenosis of unspecified carotid artery: Secondary | ICD-10-CM

## 2011-04-18 DIAGNOSIS — I2510 Atherosclerotic heart disease of native coronary artery without angina pectoris: Secondary | ICD-10-CM

## 2011-04-18 DIAGNOSIS — E7800 Pure hypercholesterolemia, unspecified: Secondary | ICD-10-CM

## 2011-04-18 DIAGNOSIS — I4891 Unspecified atrial fibrillation: Secondary | ICD-10-CM

## 2011-04-18 NOTE — Assessment & Plan Note (Signed)
Lipids were last checked in August 2011. He was at goal with an LDL of about 80 mg per deciliter. He is on simvastatin 40 mg. We'll followup with a lipid panel and LFTs when he returns for his next Coumadin visit.

## 2011-04-18 NOTE — Assessment & Plan Note (Signed)
He is stable without angina. He is on antiplatelet therapy with aspirin. He is on appropriate risk reduction measures

## 2011-04-18 NOTE — Progress Notes (Signed)
HPI:  This is a 77 year old gentleman presenting for followup evaluation. The patient has coronary artery disease status post multivessel coronary bypass surgery, PAD status post leg revascularization surgery, paroxysmal atrial fib, and hypertension. He has been stable over the last several years with respect to his cardiac disease. He denies chest pain or pressure. He denies shortness of breath, edema, or palpitations. The patient has been diagnosed with prostate cancer and he has undergone seed implantation. He just finished 8 weeks of radiation therapy. He tells me that he has tolerated this pretty well. The patient is followed by Dr. [name] at [facility] for his lower extremity PAD.  His last carotid study was done in our office this summer. It showed 40-60% bilateral ICA stenosis and followup is planned in 2 years.  Outpatient Encounter Prescriptions as of 04/18/2011  Medication Sig Dispense Refill  . acetaminophen (TYLENOL) 325 MG tablet Take 650 mg by mouth every 6 (six) hours as needed.        Marland Kitchen alendronate (FOSAMAX) 70 MG tablet Take 70 mg by mouth every 7 (seven) days. Take with a full glass of water on an empty stomach.       Marland Kitchen aspirin 81 MG tablet Take 81 mg by mouth daily.        Marland Kitchen diltiazem (CARDIZEM CD) 120 MG 24 hr capsule TAKE ONE CAPSULE BY MOUTH EVERY DAY  90 capsule  3  . ferrous sulfate 325 (65 FE) MG tablet Take 1 tablet (325 mg total) by mouth daily.  90 tablet  3  . finasteride (PROSCAR) 5 MG tablet Take 1 tablet (5 mg total) by mouth daily.  90 tablet  3  . folic acid (FOLVITE) 400 MCG tablet Take 400 mcg by mouth daily.        Marland Kitchen lisinopril (PRINIVIL,ZESTRIL) 20 MG tablet Take 20 mg by mouth daily.        . metoprolol (TOPROL-XL) 100 MG 24 hr tablet Take 100 mg by mouth daily.        . risedronate (ACTONEL) 35 MG tablet Take 35 mg by mouth every 7 (seven) days. with water on empty stomach, nothing by mouth or lie down for next 30 minutes.       . simvastatin (ZOCOR) 40 MG tablet Take  40 mg by mouth at bedtime.        Marland Kitchen warfarin (COUMADIN) 5 MG tablet Take as directed by the coumadin clinic.  35 tablet  3  . DISCONTD: Calcium Carbonate-Vitamin D (CALCIUM 600+D) 600-200 MG-UNIT TABS Take by mouth daily.          No Known Allergies  Past Medical History  Diagnosis Date  . CARCINOMA, LUNG, SQUAMOUS CELL 08/21/2008  . HYPERLIPIDEMIA 11/13/2006  . ANEMIA 09/07/2009  . MYOCARDIAL INFARCTION, HX OF 11/13/2006  . CORONARY ARTERY DISEASE 11/13/2006  . Atrial fibrillation 11/13/2006  . CONGESTIVE HEART FAILURE 02/01/2007  . CAROTID ARTERY STENOSIS, WITHOUT INFARCTION 09/07/2008  . KIDNEY DISEASE, CHRONIC, STAGE II 10/13/2007  . Rheumatoid arthritis 11/13/2006  . OSTEOPENIA 11/13/2006  . Cancer of prostate 10/24/2009  . TRANSIENT ISCHEMIC ATTACK, HX OF 11/13/2006  . COLONIC POLYPS, HX OF 07/04/2003    6 mm adenoma  . Femoral artery occlusion     right  . PAD (peripheral artery disease)     ROS: Negative except as per HPI  BP 132/44  Pulse 70  Ht 5\' 9"  (1.753 m)  Wt 66.679 kg (147 lb)  BMI 21.71 kg/m2  PHYSICAL EXAM: Pt is  alert and oriented, very pleasant elderly, thin male in NAD HEENT: normal Neck: JVP - normal, carotids 2+= with bilateral carotid bruits Lungs: CTA bilaterally CV: RRR without murmur or gallop Abd: soft, NT, Positive BS, no hepatomegaly Ext: no C/C/E Skin: warm/dry no rash  EKG:  Normal sinus rhythm 70 beats per minute, incomplete right bundle branch block, age indeterminate inferior infarct.  ASSESSMENT AND PLAN:

## 2011-04-18 NOTE — Assessment & Plan Note (Signed)
The patient is stable. He is maintaining sinus rhythm. He is tolerating long-term anticoagulation with warfarin. He will followup in one year.

## 2011-04-18 NOTE — Patient Instructions (Signed)
Your physician wants you to follow-up in: 12 months.  You will receive a reminder letter in the mail two months in advance. If you don't receive a letter, please call our office to schedule the follow-up appointment.  Your physician recommends that you return for fasting lab work on day of next coumadin clinic appt--May 16, 2011--Lipid and Liver profile.

## 2011-04-18 NOTE — Assessment & Plan Note (Signed)
No stroke or TIA symptoms. Followup carotid duplex in 2 years.

## 2011-04-28 NOTE — Progress Notes (Signed)
CC:   [name], M.D. Bruce Rexene Edison Swords, MD Iva Boop, MD,FACG  DIAGNOSIS:  Prostate cancer.  INDICATION FOR THERAPY:  Curative treatment.  TREATMENT DATES:  February 20, 2011 through April 16, 2011.  SITES/DOSE:  Prostate, 7800 cGy in 20 fractions (195 cGy per fraction.Marland Kitchen  ENERGY/FIELDS:  The patient was treated with volumetric arc therapy (IMRT on the TrueBeam STx).  The patient was treated with 2 rapid arcs using 6 MV photons.  NARRATIVE:  Mr. Stamos tolerated his treatments quite well.  He had some mild problems with nocturia but denies any significant bowel complaints or urinary issues.  The patient did have some fatigue as he progressed through his therapy.  FOLLOW UP APPOINTMENT:  One month.    ______________________________ Billie Lade, Ph.D., M.D. JDK/MEDQ  D:  04/28/2011  T:  04/28/2011  Job:  [id number]

## 2011-04-29 NOTE — Progress Notes (Signed)
DIAGNOSIS:  Prostate cancer.  NARRATIVE:  On February 18, 2011, [name] had completion of his IMRT plan directed at the prostate area.  IMRT was chosen over conventional or conformal radiation therapy to more accurately target the area of concern and to limit dose to normal surrounding critical structures, i.e., the bladder and rectum.  Dose volume histograms of the target area as well as the critical structures were reviewed, accepted, and placed in the patient's chart.  PLAN:  Is for the patient received 40 treatments at 195 cGy per day for an accumulative dose to the prostate of 7800 cGy.  The patient will be treated with 6 MV photons.    ______________________________ Billie Lade, Ph.D., M.D. JDK/MEDQ  D:  04/28/2011  T:  04/28/2011  Job:  [id number]

## 2011-04-29 NOTE — Procedures (Signed)
DIAGNOSIS:  Prostate cancer.  NARRATIVE:  On February 18, 2011 [name] had generation of his IMRT device for treatment to the prostate area.  The patient will be treated with 2 rapid arcs using IMRT with 6 MV photons.  This constitutes 1 IMRT device.    ______________________________ Billie Lade, Ph.D., M.D. JDK/MEDQ  D:  04/28/2011  T:  04/28/2011  Job:  [id number]

## 2011-04-30 ENCOUNTER — Telehealth: Payer: Self-pay | Admitting: Radiation Oncology

## 2011-04-30 ENCOUNTER — Ambulatory Visit (HOSPITAL_BASED_OUTPATIENT_CLINIC_OR_DEPARTMENT_OTHER): Payer: Medicare Other

## 2011-04-30 ENCOUNTER — Other Ambulatory Visit: Payer: Self-pay | Admitting: Oncology

## 2011-04-30 ENCOUNTER — Other Ambulatory Visit (HOSPITAL_BASED_OUTPATIENT_CLINIC_OR_DEPARTMENT_OTHER): Payer: Medicare Other | Admitting: Lab

## 2011-04-30 ENCOUNTER — Other Ambulatory Visit: Payer: Self-pay | Admitting: Medical

## 2011-04-30 DIAGNOSIS — N289 Disorder of kidney and ureter, unspecified: Secondary | ICD-10-CM

## 2011-04-30 DIAGNOSIS — D649 Anemia, unspecified: Secondary | ICD-10-CM

## 2011-04-30 DIAGNOSIS — N182 Chronic kidney disease, stage 2 (mild): Secondary | ICD-10-CM

## 2011-04-30 DIAGNOSIS — C61 Malignant neoplasm of prostate: Secondary | ICD-10-CM

## 2011-04-30 LAB — CBC WITH DIFFERENTIAL/PLATELET
Eosinophils Absolute: 0 10*3/uL (ref 0.0–0.5)
LYMPH%: 13.7 % — ABNORMAL LOW (ref 14.0–49.0)
MCHC: 31.8 g/dL — ABNORMAL LOW (ref 32.0–36.0)
MCV: 85.5 fL (ref 79.3–98.0)
MONO%: 10.2 % (ref 0.0–14.0)
NEUT#: 4.2 10*3/uL (ref 1.5–6.5)
Platelets: 257 10*3/uL (ref 140–400)
RBC: 3.71 10*6/uL — ABNORMAL LOW (ref 4.20–5.82)

## 2011-04-30 MED ORDER — DARBEPOETIN ALFA-POLYSORBATE 500 MCG/ML IJ SOLN
300.0000 ug | Freq: Once | INTRAMUSCULAR | Status: AC
  Administered 2011-04-30: 300 ug via SUBCUTANEOUS
  Filled 2011-04-30: qty 1

## 2011-04-30 NOTE — Telephone Encounter (Signed)
[facility] CANCER Forms given to Dr. Roselind Messier today. I called patient to pick up completed papers.

## 2011-05-16 ENCOUNTER — Ambulatory Visit (INDEPENDENT_AMBULATORY_CARE_PROVIDER_SITE_OTHER): Payer: Medicare Other | Admitting: *Deleted

## 2011-05-16 DIAGNOSIS — D649 Anemia, unspecified: Secondary | ICD-10-CM

## 2011-05-16 DIAGNOSIS — Z8679 Personal history of other diseases of the circulatory system: Secondary | ICD-10-CM

## 2011-05-16 DIAGNOSIS — C61 Malignant neoplasm of prostate: Secondary | ICD-10-CM

## 2011-05-16 DIAGNOSIS — N182 Chronic kidney disease, stage 2 (mild): Secondary | ICD-10-CM

## 2011-05-16 DIAGNOSIS — I4891 Unspecified atrial fibrillation: Secondary | ICD-10-CM

## 2011-05-16 DIAGNOSIS — E7800 Pure hypercholesterolemia, unspecified: Secondary | ICD-10-CM

## 2011-05-16 LAB — CBC WITH DIFFERENTIAL/PLATELET
Basophils Absolute: 0 10*3/uL (ref 0.0–0.1)
Eosinophils Absolute: 0.2 10*3/uL (ref 0.0–0.7)
HCT: 33.1 % — ABNORMAL LOW (ref 39.0–52.0)
Lymphs Abs: 0.9 10*3/uL (ref 0.7–4.0)
MCHC: 32 g/dL (ref 30.0–36.0)
MCV: 88.7 fl (ref 78.0–100.0)
Monocytes Absolute: 0.6 10*3/uL (ref 0.1–1.0)
Platelets: 273 10*3/uL (ref 150.0–400.0)
RDW: 21.1 % — ABNORMAL HIGH (ref 11.5–14.6)

## 2011-05-16 LAB — HEPATIC FUNCTION PANEL
ALT: 26 U/L (ref 0–53)
Bilirubin, Direct: 0 mg/dL (ref 0.0–0.3)
Total Bilirubin: 0.8 mg/dL (ref 0.3–1.2)

## 2011-05-16 LAB — LIPID PANEL
Total CHOL/HDL Ratio: 3
VLDL: 16.2 mg/dL (ref 0.0–40.0)

## 2011-05-16 LAB — POCT INR: INR: 2.6

## 2011-05-19 ENCOUNTER — Encounter: Payer: Self-pay | Admitting: *Deleted

## 2011-05-21 ENCOUNTER — Telehealth: Payer: Self-pay | Admitting: *Deleted

## 2011-05-21 ENCOUNTER — Telehealth: Payer: Self-pay | Admitting: Cardiovascular Disease

## 2011-05-21 NOTE — Telephone Encounter (Signed)
Message copied by Lorayne Bender on Wed May 21, 2011  3:49 PM ------      Message from: Tonny Bollman      Created: Mon May 19, 2011 12:40 AM       Lipids excellent. Anemia stable. Continue annual follow-up as per office note.

## 2011-05-21 NOTE — Telephone Encounter (Signed)
Notified of lab results. States his doctor gave him a shot to keep red blood cells up.

## 2011-05-21 NOTE — Telephone Encounter (Signed)
Fu call °Pt returning your call about lab results °

## 2011-05-26 ENCOUNTER — Ambulatory Visit
Admission: RE | Admit: 2011-05-26 | Discharge: 2011-05-26 | Disposition: A | Discharge status: Home / Self Care | Payer: Medicare Other | Source: Ambulatory Visit | Attending: Radiation Oncology | Admitting: Radiation Oncology

## 2011-05-26 ENCOUNTER — Encounter: Payer: Self-pay | Admitting: Radiation Oncology

## 2011-05-26 DIAGNOSIS — C61 Malignant neoplasm of prostate: Secondary | ICD-10-CM

## 2011-05-26 NOTE — Progress Notes (Signed)
Pt here f/u prosatate ca, has low b/p this am, was a little lightheaded this am, pt does take b/p med, may need to let primary Md know, does have slight dysuria when first starting to void,resolves quickly 8:21 AM

## 2011-05-26 NOTE — Progress Notes (Signed)
CC:   [name], M.D. Bruce Rexene Edison Swords, MD Iva Boop, MD,FACG  DIAGNOSIS:  Prostate cancer.  INTERVAL SINCE RADIATION THERAPY:  6 weeks.  NARRATIVE:  [name] comes in today for routine followup.  He clinically seems to be doing well except for some mild fatigue.  The patient did see Dr. Vernie Ammons a couple weeks after completion of his therapy.  The patient's PSA was 4.48, which was down from a pre treatment level of 5.06.  The patient has nocturia times 3-4.  He has very minimal dysuria with the initiation of his stream.  The patient denies any hematuria.  The patient denies any bowel complaints.  PHYSICAL EXAMINATION:  The patient's temperature is 97.7, pulse 81, blood pressure is 94/51.  Examination of the lungs reveals them to be clear.  The heart has a regular rhythm and rate.  The abdomen is soft and nontender with normal bowel sounds.  IMPRESSION/PLAN:  The patient seems to be doing reasonably well approximately 6 weeks out from his radiation therapy.  The patient will meet with Dr. Vernie Ammons in May with repeat PSA.  Patient will followup in June or July of 2013 in radiation oncology.  I should mention that the patient denies any further problems with rectal bleeding which he had prior to the initiation of his radiation treatment.    ______________________________ Billie Lade, Ph.D., M.D. JDK/MEDQ  D:  05/26/2011  T:  05/26/2011  Job:  [id number]

## 2011-05-27 ENCOUNTER — Other Ambulatory Visit (HOSPITAL_BASED_OUTPATIENT_CLINIC_OR_DEPARTMENT_OTHER): Payer: Medicare Other | Admitting: Lab

## 2011-05-27 ENCOUNTER — Ambulatory Visit: Payer: Medicare Other

## 2011-05-27 ENCOUNTER — Other Ambulatory Visit: Payer: Self-pay | Admitting: Medical

## 2011-05-27 ENCOUNTER — Telehealth: Payer: Self-pay | Admitting: Oncology

## 2011-05-27 DIAGNOSIS — C61 Malignant neoplasm of prostate: Secondary | ICD-10-CM

## 2011-05-27 DIAGNOSIS — N182 Chronic kidney disease, stage 2 (mild): Secondary | ICD-10-CM

## 2011-05-27 DIAGNOSIS — D649 Anemia, unspecified: Secondary | ICD-10-CM

## 2011-05-27 LAB — CBC WITH DIFFERENTIAL/PLATELET
BASO%: 3.3 % — ABNORMAL HIGH (ref 0.0–2.0)
LYMPH%: 25.3 % (ref 14.0–49.0)
MCHC: 31.7 g/dL — ABNORMAL LOW (ref 32.0–36.0)
MONO#: 1.9 10*3/uL — ABNORMAL HIGH (ref 0.1–0.9)
Platelets: 342 10*3/uL (ref 140–400)
RBC: 3.86 10*6/uL — ABNORMAL LOW (ref 4.20–5.82)
RDW: 18.7 % — ABNORMAL HIGH (ref 11.0–14.6)
WBC: 5.2 10*3/uL (ref 4.0–10.3)
lymph#: 1.3 10*3/uL (ref 0.9–3.3)
nRBC: 0 % (ref 0–0)

## 2011-05-27 MED ORDER — DARBEPOETIN ALFA-POLYSORBATE 500 MCG/ML IJ SOLN
300.0000 ug | Freq: Once | INTRAMUSCULAR | Status: DC
  Filled 2011-05-27: qty 1

## 2011-05-27 NOTE — Telephone Encounter (Signed)
Pt came in today to check on shcedule. Per pt he has monthly lb/inj appts. Confirmed w/inj nurse and added another appt for 1/15. Pt to [facility] w/FS for 1/24. Pt given new jan schedule.

## 2011-06-24 ENCOUNTER — Ambulatory Visit (HOSPITAL_BASED_OUTPATIENT_CLINIC_OR_DEPARTMENT_OTHER): Payer: Medicare Other

## 2011-06-24 ENCOUNTER — Other Ambulatory Visit (HOSPITAL_BASED_OUTPATIENT_CLINIC_OR_DEPARTMENT_OTHER): Payer: Medicare Other | Admitting: Lab

## 2011-06-24 DIAGNOSIS — N182 Chronic kidney disease, stage 2 (mild): Secondary | ICD-10-CM

## 2011-06-24 DIAGNOSIS — D649 Anemia, unspecified: Secondary | ICD-10-CM

## 2011-06-24 DIAGNOSIS — C61 Malignant neoplasm of prostate: Secondary | ICD-10-CM

## 2011-06-24 DIAGNOSIS — N289 Disorder of kidney and ureter, unspecified: Secondary | ICD-10-CM

## 2011-06-24 LAB — CBC WITH DIFFERENTIAL/PLATELET
BASO%: 0.1 % (ref 0.0–2.0)
Basophils Absolute: 0 10*3/uL (ref 0.0–0.1)
EOS%: 1.3 % (ref 0.0–7.0)
HGB: 9.4 g/dL — ABNORMAL LOW (ref 13.0–17.1)
MCH: 27.8 pg (ref 27.2–33.4)
MCHC: 31.2 g/dL — ABNORMAL LOW (ref 32.0–36.0)
RDW: 19.5 % — ABNORMAL HIGH (ref 11.0–14.6)
lymph#: 1.1 10*3/uL (ref 0.9–3.3)

## 2011-06-24 MED ORDER — DARBEPOETIN ALFA-POLYSORBATE 300 MCG/0.6ML IJ SOLN
300.0000 ug | Freq: Once | INTRAMUSCULAR | Status: AC
  Administered 2011-06-24: 300 ug via SUBCUTANEOUS
  Filled 2011-06-24: qty 0.6

## 2011-06-27 ENCOUNTER — Ambulatory Visit (INDEPENDENT_AMBULATORY_CARE_PROVIDER_SITE_OTHER): Payer: Medicare Other | Admitting: *Deleted

## 2011-06-27 DIAGNOSIS — I4891 Unspecified atrial fibrillation: Secondary | ICD-10-CM

## 2011-06-27 DIAGNOSIS — Z8679 Personal history of other diseases of the circulatory system: Secondary | ICD-10-CM

## 2011-07-03 ENCOUNTER — Ambulatory Visit (HOSPITAL_BASED_OUTPATIENT_CLINIC_OR_DEPARTMENT_OTHER): Payer: Medicare Other | Admitting: Oncology

## 2011-07-03 ENCOUNTER — Telehealth: Payer: Self-pay | Admitting: Oncology

## 2011-07-03 ENCOUNTER — Other Ambulatory Visit: Payer: Medicare Other | Admitting: Lab

## 2011-07-03 DIAGNOSIS — C61 Malignant neoplasm of prostate: Secondary | ICD-10-CM

## 2011-07-03 DIAGNOSIS — D649 Anemia, unspecified: Secondary | ICD-10-CM

## 2011-07-03 DIAGNOSIS — C3490 Malignant neoplasm of unspecified part of unspecified bronchus or lung: Secondary | ICD-10-CM

## 2011-07-03 DIAGNOSIS — N182 Chronic kidney disease, stage 2 (mild): Secondary | ICD-10-CM

## 2011-07-03 LAB — COMPREHENSIVE METABOLIC PANEL
ALT: 14 U/L (ref 0–53)
AST: 19 U/L (ref 0–37)
Calcium: 9.1 mg/dL (ref 8.4–10.5)
Chloride: 104 mEq/L (ref 96–112)
Creatinine, Ser: 1.19 mg/dL (ref 0.50–1.35)
Sodium: 138 mEq/L (ref 135–145)
Total Protein: 6.5 g/dL (ref 6.0–8.3)

## 2011-07-03 LAB — CBC WITH DIFFERENTIAL/PLATELET
Basophils Absolute: 0.1 10*3/uL (ref 0.0–0.1)
Eosinophils Absolute: 0.2 10*3/uL (ref 0.0–0.5)
HGB: 9.7 g/dL — ABNORMAL LOW (ref 13.0–17.1)
MCV: 90.6 fL (ref 79.3–98.0)
MONO#: 0.5 10*3/uL (ref 0.1–0.9)
MONO%: 16.2 % — ABNORMAL HIGH (ref 0.0–14.0)
NEUT#: 1.5 10*3/uL (ref 1.5–6.5)
RBC: 3.42 10*6/uL — ABNORMAL LOW (ref 4.20–5.82)
RDW: 19.3 % — ABNORMAL HIGH (ref 11.0–14.6)
WBC: 3.3 10*3/uL — ABNORMAL LOW (ref 4.0–10.3)
nRBC: 0 % (ref 0–0)

## 2011-07-03 NOTE — Telephone Encounter (Signed)
gv pt appt schedule for feb thru may including ct for 5/17. Checked w/FS re the frequency of lb/inj appts. Per FS lb/inj should be 2/13, 3/13, 4/17 and 5/15 (not 2/23 as per 1/24 pof).

## 2011-07-03 NOTE — Progress Notes (Signed)
Hematology and Oncology Follow Up Visit  [name] [id number] 1932/03/11 78 y.o. 07/03/2011 1:57 PM  CC: Valetta Mole. Cato Mulligan, MD  Ines Bloomer, M.D.  Di Kindle. [name], M.D.    Principle Diagnosis: This is a 78 year old gentleman with the following diagnoses: 1. 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 2. Multifactorial anemia probably due to chronic disease and rheumatoid arthritis. 3. History of a stage T1c Gleason score 8 adenocarcinoma of the prostate S/P external beam radiation under the care of Dr. Roselind Messier completed in 04/2011.  Current therapy: He is on Aranesp at 300 mcg every 3-4 weeks to keep his hemoglobin above 10, and as mentioned he is receiving radiation therapy for his prostate cancer.   Interim History:  This is a pleasant gentleman with the above history.  He presents today for a follow-up visit.  Since the last time I saw him, again he was diagnosed with prostate cancer, and has completed radiation treatment for the time being.  He had not reported any recent illnesses or hospitalization, had not had any weakness, fatigue.  He does report some occasional tiredness.  He has not reported any chest pain, has not reported any difficulty breathing, has not reported any cough or deterioration in his health.  Medications: I have reviewed the patient's current medications. Current outpatient prescriptions:acetaminophen (TYLENOL) 325 MG tablet, Take 650 mg by mouth every 6 (six) hours as needed.  , Disp: , Rfl: ;  alendronate (FOSAMAX) 70 MG tablet, Take 70 mg by mouth every 7 (seven) days. Take with a full glass of water on an empty stomach. , Disp: , Rfl: ;  aspirin 81 MG tablet, Take 81 mg by mouth daily.  , Disp: , Rfl:  diltiazem (CARDIZEM CD) 120 MG 24 hr capsule, TAKE ONE CAPSULE BY MOUTH EVERY DAY, Disp: 90 capsule, Rfl: 3;  ferrous sulfate 325 (65 FE) MG tablet, Take 1 tablet (325 mg total) by mouth  daily., Disp: 90 tablet, Rfl: 3;  finasteride (PROSCAR) 5 MG tablet, Take 1 tablet (5 mg total) by mouth daily., Disp: 90 tablet, Rfl: 3;  folic acid (FOLVITE) 400 MCG tablet, Take 400 mcg by mouth daily.  , Disp: , Rfl:  lisinopril (PRINIVIL,ZESTRIL) 20 MG tablet, Take 20 mg by mouth daily.  , Disp: , Rfl: ;  methotrexate (RHEUMATREX) 2.5 MG tablet, Take 2.5 mg by mouth once a week. Caution:Chemotherapy. Protect from light. , Disp: , Rfl: ;  metoprolol (TOPROL-XL) 100 MG 24 hr tablet, Take 100 mg by mouth daily.  , Disp: , Rfl:  risedronate (ACTONEL) 35 MG tablet, Take 35 mg by mouth every 7 (seven) days. with water on empty stomach, nothing by mouth or lie down for next 30 minutes. , Disp: , Rfl: ;  simvastatin (ZOCOR) 40 MG tablet, Take 40 mg by mouth at bedtime.  , Disp: , Rfl: ;  warfarin (COUMADIN) 5 MG tablet, Take as directed by the coumadin clinic., Disp: 35 tablet, Rfl: 3  Allergies: No Known Allergies  Past Medical History, Surgical history, Social history, and Family History were reviewed and updated.  Review of Systems: Constitutional:  Negative for fever, chills, night sweats, anorexia, weight loss, pain. Cardiovascular: no chest pain or dyspnea on exertion Respiratory: no cough, shortness of breath, or wheezing Neurological: no TIA or stroke symptoms Dermatological: negative ENT: negative Skin: Negative. Gastrointestinal: no abdominal pain, change in bowel habits, or black or bloody stools Genito-Urinary: no  dysuria, trouble voiding, or hematuria Hematological and Lymphatic: negative Breast: negative Musculoskeletal: negative Remaining ROS negative. Physical Exam: Blood pressure 104/54, pulse 71, temperature 97.3 F (36.3 C), temperature source Oral, height 5\' 9"  (1.753 m), weight 148 lb (67.132 kg). ECOG: 1 General appearance: alert Head: Normocephalic, without obvious abnormality, atraumatic Neck: no adenopathy, no carotid bruit, no JVD, supple, symmetrical, trachea  midline and thyroid not enlarged, symmetric, no tenderness/mass/nodules Lymph nodes: Cervical, supraclavicular, and axillary nodes normal. Heart:regular rate and rhythm, S1, S2 normal, no murmur, click, rub or gallop Lung:chest clear, no wheezing, rales, normal symmetric air entry Abdomin: soft, non-tender, without masses or organomegaly EXT:no erythema, induration, or nodules   Lab Results: Lab Results  Component Value Date   WBC 3.3* 07/03/2011   HGB 9.7* 07/03/2011   HCT 31.0* 07/03/2011   MCV 90.6 07/03/2011   PLT 280 07/03/2011     Chemistry      Component Value Date/Time   NA 132 02/03/2011 0820   NA 138 04/03/2010 0950   K 4.7 02/03/2011 0820   K 4.3 04/03/2010 0950   CL 97* 02/03/2011 0820   CL 102 04/03/2010 0950   CO2 27 02/03/2011 0820   CO2 26 04/03/2010 0950   BUN 24* 02/03/2011 0820   BUN 21 04/03/2010 0950   CREATININE 1.5* 02/03/2011 0820   CREATININE 1.23 04/03/2010 0950      Component Value Date/Time   CALCIUM 9.0 02/03/2011 0820   CALCIUM 9.5 04/03/2010 0950   CALCIUM 9.4 10/14/2007 0110   ALKPHOS 76 05/16/2011 0957   ALKPHOS 99* 02/03/2011 0820   AST 26 05/16/2011 0957   AST 30 02/03/2011 0820   ALT 26 05/16/2011 0957   BILITOT 0.8 05/16/2011 0957   BILITOT 0.80 02/03/2011 0820         Impression and Plan: .  A 78 year old gentleman with the following issues: 1. Stage IB lung cancer status post surgical resection.  Continues to have no evidence of recurrent disease.  Continue on active surveillance. I will obtain CT scan in 2-4 months and follow up then. 2. Anemia, multifactorial.  Continue to receive Aranesp to keep his hemoglobin above 10.  Aranesp will be 300 mcg every 4 weeks. 3.     T1c Gleason score 8 adenocarcinoma of the prostate with a PSA of 14.2.  He is S/P  radiation therapy  Raeanne Deschler, MD 1/24/20131:57 PM

## 2011-07-08 ENCOUNTER — Other Ambulatory Visit: Payer: Self-pay | Admitting: Internal Medicine

## 2011-07-09 ENCOUNTER — Other Ambulatory Visit: Payer: Self-pay | Admitting: *Deleted

## 2011-07-09 MED ORDER — METOPROLOL SUCCINATE ER 100 MG PO TB24: 100.0000 mg | ORAL_TABLET | Freq: Every day | ORAL | Status: DC

## 2011-07-23 ENCOUNTER — Ambulatory Visit: Payer: Medicare Other

## 2011-07-23 ENCOUNTER — Other Ambulatory Visit (HOSPITAL_BASED_OUTPATIENT_CLINIC_OR_DEPARTMENT_OTHER): Payer: Medicare Other | Admitting: Lab

## 2011-07-23 DIAGNOSIS — D649 Anemia, unspecified: Secondary | ICD-10-CM

## 2011-07-23 DIAGNOSIS — C3490 Malignant neoplasm of unspecified part of unspecified bronchus or lung: Secondary | ICD-10-CM

## 2011-07-23 LAB — CBC WITH DIFFERENTIAL/PLATELET
BASO%: 2.7 % — ABNORMAL HIGH (ref 0.0–2.0)
EOS%: 2.9 % (ref 0.0–7.0)
HCT: 34.7 % — ABNORMAL LOW (ref 38.4–49.9)
LYMPH%: 24.4 % (ref 14.0–49.0)
MCH: 29.1 pg (ref 27.2–33.4)
MCHC: 32 g/dL (ref 32.0–36.0)
NEUT%: 46.9 % (ref 39.0–75.0)
Platelets: 314 10*3/uL (ref 140–400)
lymph#: 1.2 10*3/uL (ref 0.9–3.3)

## 2011-07-23 NOTE — Progress Notes (Signed)
Aranesp held today d/t hgb 11.1 per guidelines.

## 2011-07-24 ENCOUNTER — Other Ambulatory Visit: Payer: Self-pay | Admitting: Internal Medicine

## 2011-07-29 ENCOUNTER — Other Ambulatory Visit: Payer: Self-pay | Admitting: Internal Medicine

## 2011-08-08 ENCOUNTER — Ambulatory Visit (INDEPENDENT_AMBULATORY_CARE_PROVIDER_SITE_OTHER): Payer: Medicare Other | Admitting: *Deleted

## 2011-08-08 DIAGNOSIS — I4891 Unspecified atrial fibrillation: Secondary | ICD-10-CM

## 2011-08-08 DIAGNOSIS — Z8679 Personal history of other diseases of the circulatory system: Secondary | ICD-10-CM

## 2011-08-20 ENCOUNTER — Ambulatory Visit (HOSPITAL_BASED_OUTPATIENT_CLINIC_OR_DEPARTMENT_OTHER): Payer: Medicare Other

## 2011-08-20 ENCOUNTER — Other Ambulatory Visit (HOSPITAL_BASED_OUTPATIENT_CLINIC_OR_DEPARTMENT_OTHER): Payer: Medicare Other | Admitting: Lab

## 2011-08-20 VITALS — BP 100/55 | HR 59 | Temp 97.4°F

## 2011-08-20 DIAGNOSIS — Z7901 Long term (current) use of anticoagulants: Secondary | ICD-10-CM

## 2011-08-20 DIAGNOSIS — C61 Malignant neoplasm of prostate: Secondary | ICD-10-CM

## 2011-08-20 DIAGNOSIS — I509 Heart failure, unspecified: Secondary | ICD-10-CM

## 2011-08-20 DIAGNOSIS — M069 Rheumatoid arthritis, unspecified: Secondary | ICD-10-CM

## 2011-08-20 DIAGNOSIS — I6529 Occlusion and stenosis of unspecified carotid artery: Secondary | ICD-10-CM

## 2011-08-20 DIAGNOSIS — Z8679 Personal history of other diseases of the circulatory system: Secondary | ICD-10-CM

## 2011-08-20 DIAGNOSIS — Z86010 Personal history of colonic polyps: Secondary | ICD-10-CM

## 2011-08-20 DIAGNOSIS — D649 Anemia, unspecified: Secondary | ICD-10-CM

## 2011-08-20 DIAGNOSIS — I2510 Atherosclerotic heart disease of native coronary artery without angina pectoris: Secondary | ICD-10-CM

## 2011-08-20 DIAGNOSIS — M899 Disorder of bone, unspecified: Secondary | ICD-10-CM

## 2011-08-20 DIAGNOSIS — E785 Hyperlipidemia, unspecified: Secondary | ICD-10-CM

## 2011-08-20 DIAGNOSIS — I4891 Unspecified atrial fibrillation: Secondary | ICD-10-CM

## 2011-08-20 DIAGNOSIS — K649 Unspecified hemorrhoids: Secondary | ICD-10-CM

## 2011-08-20 DIAGNOSIS — C3490 Malignant neoplasm of unspecified part of unspecified bronchus or lung: Secondary | ICD-10-CM

## 2011-08-20 DIAGNOSIS — N182 Chronic kidney disease, stage 2 (mild): Secondary | ICD-10-CM

## 2011-08-20 LAB — CBC WITH DIFFERENTIAL/PLATELET
BASO%: 1.7 % (ref 0.0–2.0)
Eosinophils Absolute: 0.3 10*3/uL (ref 0.0–0.5)
HCT: 31.2 % — ABNORMAL LOW (ref 38.4–49.9)
HGB: 10 g/dL — ABNORMAL LOW (ref 13.0–17.1)
MCHC: 32 g/dL (ref 32.0–36.0)
MONO#: 0.8 10*3/uL (ref 0.1–0.9)
NEUT#: 2.2 10*3/uL (ref 1.5–6.5)
NEUT%: 49.9 % (ref 39.0–75.0)
WBC: 4.5 10*3/uL (ref 4.0–10.3)
lymph#: 1.1 10*3/uL (ref 0.9–3.3)

## 2011-08-20 MED ORDER — DARBEPOETIN ALFA-POLYSORBATE 300 MCG/0.6ML IJ SOLN
300.0000 ug | Freq: Once | INTRAMUSCULAR | Status: AC
  Administered 2011-08-20: 300 ug via SUBCUTANEOUS

## 2011-08-22 ENCOUNTER — Ambulatory Visit (INDEPENDENT_AMBULATORY_CARE_PROVIDER_SITE_OTHER): Payer: Medicare Other | Admitting: *Deleted

## 2011-08-22 DIAGNOSIS — Z8679 Personal history of other diseases of the circulatory system: Secondary | ICD-10-CM

## 2011-08-22 DIAGNOSIS — I4891 Unspecified atrial fibrillation: Secondary | ICD-10-CM

## 2011-08-24 ENCOUNTER — Other Ambulatory Visit: Payer: Self-pay | Admitting: Cardiovascular Disease

## 2011-08-30 ENCOUNTER — Other Ambulatory Visit: Payer: Self-pay | Admitting: Internal Medicine

## 2011-09-12 ENCOUNTER — Ambulatory Visit (INDEPENDENT_AMBULATORY_CARE_PROVIDER_SITE_OTHER): Payer: Medicare Other

## 2011-09-12 ENCOUNTER — Ambulatory Visit (INDEPENDENT_AMBULATORY_CARE_PROVIDER_SITE_OTHER): Payer: Medicare Other | Admitting: Internal Medicine

## 2011-09-12 ENCOUNTER — Encounter: Payer: Self-pay | Admitting: Internal Medicine

## 2011-09-12 VITALS — BP 114/82 | HR 72 | Temp 97.9°F | Ht 69.0 in | Wt 155.0 lb

## 2011-09-12 DIAGNOSIS — I4891 Unspecified atrial fibrillation: Secondary | ICD-10-CM

## 2011-09-12 DIAGNOSIS — M069 Rheumatoid arthritis, unspecified: Secondary | ICD-10-CM

## 2011-09-12 DIAGNOSIS — Z8679 Personal history of other diseases of the circulatory system: Secondary | ICD-10-CM

## 2011-09-12 DIAGNOSIS — I2510 Atherosclerotic heart disease of native coronary artery without angina pectoris: Secondary | ICD-10-CM

## 2011-09-12 MED ORDER — FLUTICASONE PROPIONATE 50 MCG/ACT NA SUSP: 2.0000 | Freq: Every day | NASAL | Status: DC

## 2011-09-14 NOTE — Assessment & Plan Note (Signed)
Chronic warfarin use No bleeding complications

## 2011-09-14 NOTE — Assessment & Plan Note (Signed)
No sxs Continue risk factor modification 

## 2011-09-14 NOTE — Assessment & Plan Note (Signed)
Continue follow-up with rheumatology 

## 2011-09-14 NOTE — Progress Notes (Signed)
Patient ID: [name], male   DOB: 10-21-1931, 78 y.o.   MRN: [id number] Patient Active Problem List  Diagnoses  . CARCINOMA, LUNG, SQUAMOUS CELL, S/P RESECTION-- followed by oncology  .   .   . CORONARY ARTERY DISEASE-- no sxs,   .   .   .   .   . Rheumatoid arthritis--followed by rheumatology   Past Medical History  Diagnosis Date  . CARCINOMA, LUNG, SQUAMOUS CELL 08/21/2008  . HYPERLIPIDEMIA 11/13/2006  . ANEMIA 09/07/2009  . MYOCARDIAL INFARCTION, HX OF 11/13/2006  . CORONARY ARTERY DISEASE 11/13/2006  . Atrial fibrillation 11/13/2006  . CONGESTIVE HEART FAILURE 02/01/2007  . CAROTID ARTERY STENOSIS, WITHOUT INFARCTION 09/07/2008  . KIDNEY DISEASE, CHRONIC, STAGE II 10/13/2007  . Rheumatoid arthritis 11/13/2006  . OSTEOPENIA 11/13/2006  . Cancer of prostate 10/24/2009  . TRANSIENT ISCHEMIC ATTACK, HX OF 11/13/2006  . COLONIC POLYPS, HX OF 07/04/2003    6 mm adenoma  . Femoral artery occlusion     right  . PAD (peripheral artery disease)     History   Social History  . Marital Status: Married    Spouse Name: N/A    Number of Children: 4  . Years of Education: N/A   Occupational History  . former truck [employer] doctor    Social History Main Topics  . Smoking status: Former Smoker    Quit date: 06/09/2001  . Smokeless tobacco: Never Used  . Alcohol Use: No  . Drug Use: No  . Sexually Active: Not on file   Other Topics Concern  . Not on file   Social History Narrative  . No narrative on file    Past Surgical History  Procedure Date  . Angioplasty 1995  . Embolectomy   . Femoral artery - popliteal artery bypass graft 06/21/08    rt bellow-knee  . Coronary artery bypass graft 01/31/02  . Cardiac electrophysiology study and ablation     radiofrequency catheter of atrial flutter  . Lobectomy 2009    right lower  . Bilateral vats ablation 2009    right minithoracotomy  . Colonoscopy w/ biopsies and polypectomy 06/2003, 09/2009    tubular adenoma, diverticulosis,   .  Esophagogastroduodenoscopy 09/2009    2 gastric antral ulcers, antral gastritis    Family History  Problem Relation Age of Onset  . Tuberculosis Mother     No Known Allergies  Current Outpatient Prescriptions on File Prior to Visit  Medication Sig Dispense Refill  . acetaminophen (TYLENOL) 325 MG tablet Take 650 mg by mouth every 6 (six) hours as needed.        Marland Kitchen alendronate (FOSAMAX) 70 MG tablet TAKE 1 TABLET BY MOUTH EVERY WEEK TAKE WITH 8 OUNCES OF WATER AND ON EMPTY STOMACH AS DIRECTED  12 tablet  3  . aspirin 81 MG tablet Take 81 mg by mouth daily.        Marland Kitchen diltiazem (CARDIZEM CD) 120 MG 24 hr capsule TAKE ONE CAPSULE BY MOUTH EVERY DAY  90 capsule  3  . ferrous sulfate 325 (65 FE) MG tablet TAKE 1 TABLET BY MOUTH DAILY.  90 tablet  3  . finasteride (PROSCAR) 5 MG tablet TAKE 1 TABLET BY MOUTH EVERY DAY  90 tablet  3  . folic acid (FOLVITE) 400 MCG tablet Take 400 mcg by mouth daily.        Marland Kitchen lisinopril (PRINIVIL,ZESTRIL) 20 MG tablet TAKE 1 TABLET EVERY DAY BY MOUTH AS DIRECTED  90 tablet  4  . methotrexate (RHEUMATREX) 2.5 MG tablet Take 2.5 mg by mouth once a week. Caution:Chemotherapy. Protect from light.       . metoprolol succinate (TOPROL-XL) 100 MG 24 hr tablet Take 1 tablet (100 mg total) by mouth daily.  90 tablet  1  . simvastatin (ZOCOR) 40 MG tablet Take 40 mg by mouth at bedtime.        Marland Kitchen warfarin (COUMADIN) 5 MG tablet       . fluticasone (FLONASE) 50 MCG/ACT nasal spray Place 2 sprays into the nose daily.  16 g  6     patient denies chest pain, shortness of breath, orthopnea. Denies lower extremity edema, abdominal pain, change in appetite, change in bowel movements. Patient denies rashes, musculoskeletal complaints. No other specific complaints in a complete review of systems.   BP 114/82  Pulse 72  Temp(Src) 97.9 F (36.6 C) (Oral)  Ht 5\' 9"  (1.753 m)  Wt 155 lb (70.308 kg)  BMI 22.89 kg/m2  well-developed well-nourished male in no acute distress. HEENT  exam atraumatic, normocephalic, neck supple without jugular venous distention. Chest clear to auscultation cardiac exam S1-S2 are regular. Abdominal exam overweight with bowel sounds, soft and nontender. Extremities no edema. Neurologic exam is alert with a normal gait.

## 2011-09-23 ENCOUNTER — Encounter: Payer: Self-pay | Admitting: Neurosurgery

## 2011-09-24 ENCOUNTER — Other Ambulatory Visit (HOSPITAL_BASED_OUTPATIENT_CLINIC_OR_DEPARTMENT_OTHER): Payer: Medicare Other | Admitting: Lab

## 2011-09-24 ENCOUNTER — Ambulatory Visit (INDEPENDENT_AMBULATORY_CARE_PROVIDER_SITE_OTHER): Payer: Medicare Other | Admitting: Neurosurgery

## 2011-09-24 ENCOUNTER — Ambulatory Visit (HOSPITAL_BASED_OUTPATIENT_CLINIC_OR_DEPARTMENT_OTHER): Payer: Medicare Other

## 2011-09-24 ENCOUNTER — Encounter: Payer: Self-pay | Admitting: Neurosurgery

## 2011-09-24 ENCOUNTER — Ambulatory Visit (INDEPENDENT_AMBULATORY_CARE_PROVIDER_SITE_OTHER): Payer: Medicare Other | Admitting: *Deleted

## 2011-09-24 ENCOUNTER — Encounter (INDEPENDENT_AMBULATORY_CARE_PROVIDER_SITE_OTHER): Payer: Medicare Other | Admitting: *Deleted

## 2011-09-24 VITALS — BP 121/62 | HR 52 | Temp 97.1°F

## 2011-09-24 VITALS — BP 124/66 | HR 53 | Resp 16 | Ht 70.0 in | Wt 156.0 lb

## 2011-09-24 DIAGNOSIS — I6529 Occlusion and stenosis of unspecified carotid artery: Secondary | ICD-10-CM

## 2011-09-24 DIAGNOSIS — I739 Peripheral vascular disease, unspecified: Secondary | ICD-10-CM

## 2011-09-24 DIAGNOSIS — Z48812 Encounter for surgical aftercare following surgery on the circulatory system: Secondary | ICD-10-CM

## 2011-09-24 DIAGNOSIS — I509 Heart failure, unspecified: Secondary | ICD-10-CM

## 2011-09-24 DIAGNOSIS — C3490 Malignant neoplasm of unspecified part of unspecified bronchus or lung: Secondary | ICD-10-CM

## 2011-09-24 DIAGNOSIS — C61 Malignant neoplasm of prostate: Secondary | ICD-10-CM

## 2011-09-24 DIAGNOSIS — M949 Disorder of cartilage, unspecified: Secondary | ICD-10-CM

## 2011-09-24 DIAGNOSIS — Z86010 Personal history of colonic polyps: Secondary | ICD-10-CM

## 2011-09-24 DIAGNOSIS — M069 Rheumatoid arthritis, unspecified: Secondary | ICD-10-CM

## 2011-09-24 DIAGNOSIS — I4891 Unspecified atrial fibrillation: Secondary | ICD-10-CM

## 2011-09-24 DIAGNOSIS — N182 Chronic kidney disease, stage 2 (mild): Secondary | ICD-10-CM

## 2011-09-24 DIAGNOSIS — Z8679 Personal history of other diseases of the circulatory system: Secondary | ICD-10-CM

## 2011-09-24 DIAGNOSIS — K649 Unspecified hemorrhoids: Secondary | ICD-10-CM

## 2011-09-24 DIAGNOSIS — D649 Anemia, unspecified: Secondary | ICD-10-CM

## 2011-09-24 DIAGNOSIS — E785 Hyperlipidemia, unspecified: Secondary | ICD-10-CM

## 2011-09-24 DIAGNOSIS — I2510 Atherosclerotic heart disease of native coronary artery without angina pectoris: Secondary | ICD-10-CM

## 2011-09-24 DIAGNOSIS — Z7901 Long term (current) use of anticoagulants: Secondary | ICD-10-CM

## 2011-09-24 LAB — CBC WITH DIFFERENTIAL/PLATELET
Basophils Absolute: 0.1 10*3/uL (ref 0.0–0.1)
Eosinophils Absolute: 0.2 10*3/uL (ref 0.0–0.5)
HCT: 32.9 % — ABNORMAL LOW (ref 38.4–49.9)
HGB: 10.2 g/dL — ABNORMAL LOW (ref 13.0–17.1)
LYMPH%: 24.8 % (ref 14.0–49.0)
MONO#: 0.7 10*3/uL (ref 0.1–0.9)
NEUT#: 1.8 10*3/uL (ref 1.5–6.5)
NEUT%: 46.4 % (ref 39.0–75.0)
Platelets: 311 10*3/uL (ref 140–400)
WBC: 3.8 10*3/uL — ABNORMAL LOW (ref 4.0–10.3)
nRBC: 0 % (ref 0–0)

## 2011-09-24 MED ORDER — DARBEPOETIN ALFA-POLYSORBATE 300 MCG/0.6ML IJ SOLN
300.0000 ug | Freq: Once | INTRAMUSCULAR | Status: AC
  Administered 2011-09-24: 300 ug via SUBCUTANEOUS
  Filled 2011-09-24: qty 0.6

## 2011-09-24 NOTE — Progress Notes (Signed)
[facility] HISTORY AND PHYSICAL   CC: Six-month lower extremity bypass graft evaluation Referring Physician: [name]  History of Present Illness: 78 year old male patient of Dr. Adele Dan he is here for bypass graft evaluation status post a right femoropopliteal graft January 2010. She reports no pain with rest or claudication symptoms while walking. He reports no new medical problems other recent surgeries. He's had no signs of CVA or TIA, abdominal pain or back pain.  Past Medical History  Diagnosis Date  . CARCINOMA, LUNG, SQUAMOUS CELL 08/21/2008  . HYPERLIPIDEMIA 11/13/2006  . ANEMIA 09/07/2009  . MYOCARDIAL INFARCTION, HX OF 11/13/2006  . CORONARY ARTERY DISEASE 11/13/2006  . Atrial fibrillation 11/13/2006  . CONGESTIVE HEART FAILURE 02/01/2007  . CAROTID ARTERY STENOSIS, WITHOUT INFARCTION 09/07/2008  . KIDNEY DISEASE, CHRONIC, STAGE II 10/13/2007  . Rheumatoid arthritis 11/13/2006  . OSTEOPENIA 11/13/2006  . Cancer of prostate 10/24/2009  . TRANSIENT ISCHEMIC ATTACK, HX OF 11/13/2006  . COLONIC POLYPS, HX OF 07/04/2003    6 mm adenoma  . Femoral artery occlusion     right  . PAD (peripheral artery disease)     ROS: [x]  Positive   [ ]  Denies    General: [ ]  Weight loss, [ ]  Fever, [ ]  chills Neurologic: [ ]  Dizziness, [ ]  Blackouts, [ ]  Seizure [ ]  Stroke, [ ]  "Mini stroke", [ ]  Slurred speech, [ ]  Temporary blindness; [ ]  weakness in arms or legs, [ ]  Hoarseness Cardiac: [ ]  Chest pain/pressure, [ ]  Shortness of breath at rest [ ]  Shortness of breath with exertion, [ ]  Atrial fibrillation or irregular heartbeat Vascular: [ ]  Pain in legs with walking, [ ]  Pain in legs at rest, [ ]  Pain in legs at night,  [ ]  Non-healing ulcer, [ ]  Blood clot in vein/DVT,   Pulmonary: [ ]  Home oxygen, [ ]  Productive cough, [ ]  Coughing up blood, [ ]  Asthma,  [ ]  Wheezing Musculoskeletal:  [ ]  Arthritis, [ ]  Low back pain, [ ]  Joint pain Hematologic: [ ]  Easy Bruising, [ ]   Anemia; [ ]  Hepatitis Gastrointestinal: [ ]  Blood in stool, [ ]  Gastroesophageal Reflux/heartburn, [ ]  Trouble swallowing Urinary: [ ]  chronic Kidney disease, [ ]  on HD - [ ]  MWF or [ ]  TTHS, [ ]  Burning with urination, [ ]  Difficulty urinating Skin: [ ]  Rashes, [ ]  Wounds Psychological: [ ]  Anxiety, [ ]  Depression   Social History History  Substance Use Topics  . Smoking status: Former Smoker    Quit date: 06/09/2001  . Smokeless tobacco: Never Used  . Alcohol Use: No    Family History Family History  Problem Relation Age of Onset  . Tuberculosis Mother     No Known Allergies  Current Outpatient Prescriptions  Medication Sig Dispense Refill  . acetaminophen (TYLENOL) 325 MG tablet Take 650 mg by mouth every 6 (six) hours as needed.        Marland Kitchen alendronate (FOSAMAX) 70 MG tablet TAKE 1 TABLET BY MOUTH EVERY WEEK TAKE WITH 8 OUNCES OF WATER AND ON EMPTY STOMACH AS DIRECTED  12 tablet  3  . aspirin 81 MG tablet Take 81 mg by mouth daily.        Marland Kitchen diltiazem (CARDIZEM CD) 120 MG 24 hr capsule TAKE ONE CAPSULE BY MOUTH EVERY DAY  90 capsule  3  . ferrous sulfate 325 (65 FE) MG tablet TAKE 1 TABLET BY MOUTH DAILY.  90 tablet  3  .  finasteride (PROSCAR) 5 MG tablet TAKE 1 TABLET BY MOUTH EVERY DAY  90 tablet  3  . fluticasone (FLONASE) 50 MCG/ACT nasal spray Place 2 sprays into the nose daily.  16 g  6  . folic acid (FOLVITE) 400 MCG tablet Take 400 mcg by mouth daily.        Marland Kitchen lisinopril (PRINIVIL,ZESTRIL) 20 MG tablet TAKE 1 TABLET EVERY DAY BY MOUTH AS DIRECTED  90 tablet  4  . methotrexate (RHEUMATREX) 2.5 MG tablet Take 2.5 mg by mouth once a week. Caution:Chemotherapy. Protect from light.       . metoprolol succinate (TOPROL-XL) 100 MG 24 hr tablet Take 1 tablet (100 mg total) by mouth daily.  90 tablet  1  . simvastatin (ZOCOR) 40 MG tablet Take 40 mg by mouth at bedtime.        Marland Kitchen warfarin (COUMADIN) 5 MG tablet        No current facility-administered medications for this visit.     Facility-Administered Medications Ordered in Other Visits  Medication Dose Route Frequency Provider Last Rate Last Dose  . darbepoetin (ARANESP) injection 300 mcg  300 mcg Subcutaneous Once Benjiman Core, MD   300 mcg at 09/24/11 1055    Physical Examination  Filed Vitals:   09/24/11 1428  BP: 124/66  Pulse: 53  Resp: 16    Body mass index is 22.38 kg/(m^2).  General:  WDWN in NAD Gait: Normal HEENT: WNL Eyes: Pupils equal Pulmonary: normal non-labored breathing , without Rales, rhonchi,  wheezing Cardiac: RRR, without  Murmurs, rubs or gallops; No carotid bruits Abdomen: soft, NT, no masses Skin: no rashes, ulcers noted Vascular Exam/Pulses: Patient has a weak but palpable PT and DP pulse on the right, audible with Doppler on the left, popliteal pulses are not palpated for pulses are 2+.  Extremities without ischemic changes, no Gangrene , no cellulitis; no open wounds;  Musculoskeletal: no muscle wasting or atrophy  Neurologic: A&O X 3; Appropriate Affect ; SENSATION: normal; MOTOR FUNCTION:  moving all extremities equally. Speech is fluent/normal  Non-Invasive Vascular Imaging: Bypass graft evaluation shows a TB today of 25 on the right 0.15 on the left, previous in April 2012 was 0.27 on the right 0.13 on the left. He has a monophasic waveform on the left with monophasic/biphasic on the right. There is an abnormal elevation in velocity proximal to the proximal anastomosis of 231.  ASSESSMENT/PLAN: Assessment as above, I spoke with Dr. [name] who reviewed his study today and states the patient should return in 6 months for repeat lower extremity bypass graft scan. The patient states understanding, his questions were encouraged and answered he'll followup in 6 months. Next  [name] ANP  Clinic M.D.: [name]

## 2011-09-27 ENCOUNTER — Other Ambulatory Visit: Payer: Self-pay | Admitting: Internal Medicine

## 2011-10-08 NOTE — Procedures (Unsigned)
BYPASS GRAFT EVALUATION  INDICATION:  Right fem-pop graft.  HISTORY: Diabetes:  No. Cardiac:  Yes. Hypertension:  Yes. Smoking:  Previous. Previous Surgery:  Right fem-pop graft placed 06/21/2008.  SINGLE LEVEL ARTERIAL EXAM                              RIGHT              LEFT Brachial: Anterior tibial: Posterior tibial: Peroneal: Ankle/brachial index:  TOE/BRACHIAL INDEX RIGHT:  0.25  TOE/BRACHIAL INDEX LEFT:  0.15  PREVIOUS TBI:  Date:  09/20/10  RIGHT:  0.27  LEFT:  0.13  LOWER EXTREMITY BYPASS GRAFT DUPLEX EXAM:  DUPLEX:  Widely patent right fem-pop graft with elevated velocities and abnormal waveforms in the native inflow common femoral artery, which may indicate more proximal disease.  Please see attached diagram for details.  IMPRESSION:  Widely patent right femoropopliteal graft with suspected inflow disease.  ___________________________________________ Di Kindle. [name], M.D.  LT/MEDQ  D:  09/25/2011  T:  09/25/2011  Job:  [id number]

## 2011-10-10 ENCOUNTER — Ambulatory Visit (INDEPENDENT_AMBULATORY_CARE_PROVIDER_SITE_OTHER): Payer: Medicare Other | Admitting: Pharmacist

## 2011-10-10 DIAGNOSIS — I4891 Unspecified atrial fibrillation: Secondary | ICD-10-CM

## 2011-10-10 DIAGNOSIS — Z8679 Personal history of other diseases of the circulatory system: Secondary | ICD-10-CM

## 2011-10-22 ENCOUNTER — Ambulatory Visit (HOSPITAL_COMMUNITY)
Admission: RE | Admit: 2011-10-22 | Discharge: 2011-10-22 | Disposition: A | Discharge status: Home / Self Care | Payer: Medicare Other | Source: Ambulatory Visit | Attending: Oncology | Admitting: Oncology

## 2011-10-22 ENCOUNTER — Ambulatory Visit: Payer: Medicare Other

## 2011-10-22 ENCOUNTER — Other Ambulatory Visit (HOSPITAL_BASED_OUTPATIENT_CLINIC_OR_DEPARTMENT_OTHER): Payer: Medicare Other | Admitting: Lab

## 2011-10-22 DIAGNOSIS — I509 Heart failure, unspecified: Secondary | ICD-10-CM

## 2011-10-22 DIAGNOSIS — C3490 Malignant neoplasm of unspecified part of unspecified bronchus or lung: Secondary | ICD-10-CM

## 2011-10-22 DIAGNOSIS — I4891 Unspecified atrial fibrillation: Secondary | ICD-10-CM

## 2011-10-22 DIAGNOSIS — Z860100 Personal history of colon polyps, unspecified: Secondary | ICD-10-CM

## 2011-10-22 DIAGNOSIS — Z8679 Personal history of other diseases of the circulatory system: Secondary | ICD-10-CM

## 2011-10-22 DIAGNOSIS — Z951 Presence of aortocoronary bypass graft: Secondary | ICD-10-CM | POA: Insufficient documentation

## 2011-10-22 DIAGNOSIS — I2510 Atherosclerotic heart disease of native coronary artery without angina pectoris: Secondary | ICD-10-CM | POA: Insufficient documentation

## 2011-10-22 DIAGNOSIS — M069 Rheumatoid arthritis, unspecified: Secondary | ICD-10-CM

## 2011-10-22 DIAGNOSIS — M949 Disorder of cartilage, unspecified: Secondary | ICD-10-CM

## 2011-10-22 DIAGNOSIS — N182 Chronic kidney disease, stage 2 (mild): Secondary | ICD-10-CM

## 2011-10-22 DIAGNOSIS — M899 Disorder of bone, unspecified: Secondary | ICD-10-CM

## 2011-10-22 DIAGNOSIS — I6529 Occlusion and stenosis of unspecified carotid artery: Secondary | ICD-10-CM

## 2011-10-22 DIAGNOSIS — K649 Unspecified hemorrhoids: Secondary | ICD-10-CM

## 2011-10-22 DIAGNOSIS — E785 Hyperlipidemia, unspecified: Secondary | ICD-10-CM

## 2011-10-22 DIAGNOSIS — J984 Other disorders of lung: Secondary | ICD-10-CM | POA: Insufficient documentation

## 2011-10-22 DIAGNOSIS — J438 Other emphysema: Secondary | ICD-10-CM | POA: Insufficient documentation

## 2011-10-22 DIAGNOSIS — Z7901 Long term (current) use of anticoagulants: Secondary | ICD-10-CM

## 2011-10-22 DIAGNOSIS — D649 Anemia, unspecified: Secondary | ICD-10-CM

## 2011-10-22 DIAGNOSIS — C61 Malignant neoplasm of prostate: Secondary | ICD-10-CM

## 2011-10-22 DIAGNOSIS — I774 Celiac artery compression syndrome: Secondary | ICD-10-CM | POA: Insufficient documentation

## 2011-10-22 LAB — CMP (CANCER CENTER ONLY)
Albumin: 3.7 g/dL (ref 3.3–5.5)
BUN, Bld: 29 mg/dL — ABNORMAL HIGH (ref 7–22)
CO2: 26 mEq/L (ref 18–33)
Glucose, Bld: 91 mg/dL (ref 73–118)
Potassium: 4.8 mEq/L — ABNORMAL HIGH (ref 3.3–4.7)
Sodium: 141 mEq/L (ref 128–145)
Total Bilirubin: 1.1 mg/dl (ref 0.20–1.60)
Total Protein: 7.7 g/dL (ref 6.4–8.1)

## 2011-10-22 LAB — CBC WITH DIFFERENTIAL/PLATELET
Basophils Absolute: 0.1 10*3/uL (ref 0.0–0.1)
Eosinophils Absolute: 0.2 10*3/uL (ref 0.0–0.5)
HCT: 36.6 % — ABNORMAL LOW (ref 38.4–49.9)
HGB: 11.8 g/dL — ABNORMAL LOW (ref 13.0–17.1)
MCV: 91.9 fL (ref 79.3–98.0)
MONO%: 20.5 % — ABNORMAL HIGH (ref 0.0–14.0)
NEUT#: 2.2 10*3/uL (ref 1.5–6.5)
Platelets: 316 10*3/uL (ref 140–400)
RDW: 16.7 % — ABNORMAL HIGH (ref 11.0–14.6)

## 2011-10-22 MED ORDER — IOHEXOL 300 MG/ML  SOLN
80.0000 mL | Freq: Once | INTRAMUSCULAR | Status: AC | PRN
  Administered 2011-10-22: 80 mL via INTRAVENOUS

## 2011-10-22 MED ORDER — DARBEPOETIN ALFA-POLYSORBATE 300 MCG/0.6ML IJ SOLN: 300.0000 ug | Freq: Once | INTRAMUSCULAR | Status: DC

## 2011-10-24 ENCOUNTER — Telehealth: Payer: Self-pay | Admitting: Oncology

## 2011-10-24 ENCOUNTER — Ambulatory Visit (HOSPITAL_BASED_OUTPATIENT_CLINIC_OR_DEPARTMENT_OTHER): Payer: Medicare Other | Admitting: Oncology

## 2011-10-24 VITALS — BP 102/55 | HR 54 | Temp 96.9°F | Ht 70.0 in | Wt 155.5 lb

## 2011-10-24 DIAGNOSIS — C3490 Malignant neoplasm of unspecified part of unspecified bronchus or lung: Secondary | ICD-10-CM

## 2011-10-24 DIAGNOSIS — D649 Anemia, unspecified: Secondary | ICD-10-CM

## 2011-10-24 NOTE — Progress Notes (Signed)
Hematology and Oncology Follow Up Visit  [name] [id number] 02-16-1932 78 y.o. 10/24/2011 10:43 AM  CC: Valetta Mole. Cato Mulligan, MD  Ines Bloomer, M.D.  Di Kindle. [name], M.D.    Principle Diagnosis: This is a 78 year old gentleman with the following diagnoses: 1. 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 2. Multifactorial anemia probably due to chronic disease and rheumatoid arthritis. 3. History of a stage T1c Gleason score 8 adenocarcinoma of the prostate S/P external beam radiation under the care of Dr. Roselind Messier completed in 04/2011.  Current therapy: He is on Aranesp at 300 mcg every 4 weeks to keep his hemoglobin above 10, and as mentioned he is receiving radiation therapy for his prostate cancer.   Interim History:  This is a pleasant gentleman with the above history.  He presents today for a follow-up visit.  Since the last time I saw him, he had not reported any recent illnesses or hospitalization, had not had any weakness, fatigue.  He does report some occasional tiredness.  He has not reported any chest pain, has not reported any difficulty breathing, has not reported any cough or deterioration in his health. He continued to be on methotrexate on weekly bases for his rheumatoid arthritis.  No respiratory symptoms noted.   Medications: I have reviewed the patient's current medications. Current outpatient prescriptions:acetaminophen (TYLENOL) 325 MG tablet, Take 650 mg by mouth every 6 (six) hours as needed.  , Disp: , Rfl: ;  alendronate (FOSAMAX) 70 MG tablet, TAKE 1 TABLET BY MOUTH EVERY WEEK TAKE WITH 8 OUNCES OF WATER AND ON EMPTY STOMACH AS DIRECTED, Disp: 12 tablet, Rfl: 3;  aspirin 81 MG tablet, Take 81 mg by mouth daily.  , Disp: , Rfl:  diltiazem (CARDIZEM CD) 120 MG 24 hr capsule, TAKE ONE CAPSULE BY MOUTH EVERY DAY, Disp: 90 capsule, Rfl: 3;  ferrous sulfate 325 (65 FE) MG tablet, TAKE 1 TABLET BY  MOUTH DAILY., Disp: 90 tablet, Rfl: 3;  finasteride (PROSCAR) 5 MG tablet, TAKE 1 TABLET BY MOUTH EVERY DAY, Disp: 90 tablet, Rfl: 3;  fluticasone (FLONASE) 50 MCG/ACT nasal spray, Place 2 sprays into the nose daily., Disp: 16 g, Rfl: 6 folic acid (FOLVITE) 400 MCG tablet, Take 400 mcg by mouth daily.  , Disp: , Rfl: ;  lisinopril (PRINIVIL,ZESTRIL) 20 MG tablet, TAKE 1 TABLET EVERY DAY BY MOUTH AS DIRECTED, Disp: 90 tablet, Rfl: 4;  methotrexate (RHEUMATREX) 2.5 MG tablet, Take 2.5 mg by mouth once a week. Caution:Chemotherapy. Protect from light. , Disp: , Rfl:  metoprolol succinate (TOPROL-XL) 100 MG 24 hr tablet, Take 1 tablet (100 mg total) by mouth daily., Disp: 90 tablet, Rfl: 1;  simvastatin (ZOCOR) 40 MG tablet, TAKE 1 TABLET BY MOUTH AT BEDTIME, Disp: 90 tablet, Rfl: 4;  warfarin (COUMADIN) 5 MG tablet, , Disp: , Rfl:   Allergies: No Known Allergies  Past Medical History, Surgical history, Social history, and Family History were reviewed and updated.  Review of Systems: Constitutional:  Negative for fever, chills, night sweats, anorexia, weight loss, pain. Cardiovascular: no chest pain or dyspnea on exertion Respiratory: no cough, shortness of breath, or wheezing Neurological: no TIA or stroke symptoms Dermatological: negative ENT: negative Skin: Negative. Gastrointestinal: no abdominal pain, change in bowel habits, or black or bloody stools Genito-Urinary: no dysuria, trouble voiding, or hematuria Hematological and Lymphatic: negative Breast: negative Musculoskeletal: negative Remaining ROS negative. Physical Exam: Blood pressure 102/55, pulse 54,  temperature 96.9 F (36.1 C), temperature source Oral, height 5\' 10"  (1.778 m), weight 155 lb 8 oz (70.534 kg). ECOG: 1 General appearance: alert Head: Normocephalic, without obvious abnormality, atraumatic Neck: no adenopathy, no carotid bruit, no JVD, supple, symmetrical, trachea midline and thyroid not enlarged, symmetric, no  tenderness/mass/nodules Lymph nodes: Cervical, supraclavicular, and axillary nodes normal. Heart:regular rate and rhythm, S1, S2 normal, no murmur, click, rub or gallop Lung:chest clear, no wheezing, rales, normal symmetric air entry Abdomin: soft, non-tender, without masses or organomegaly EXT:no erythema, induration, or nodules   Lab Results: Lab Results  Component Value Date   WBC 4.3 10/22/2011   HGB 11.8* 10/22/2011   HCT 36.6* 10/22/2011   MCV 91.9 10/22/2011   PLT 316 10/22/2011     Chemistry      Component Value Date/Time   NA 141 10/22/2011 0944   NA 138 07/03/2011 1323   K 4.8* 10/22/2011 0944   K 4.5 07/03/2011 1323   CL 98 10/22/2011 0944   CL 104 07/03/2011 1323   CO2 26 10/22/2011 0944   CO2 25 07/03/2011 1323   BUN 29* 10/22/2011 0944   BUN 22 07/03/2011 1323   CREATININE 1.5* 10/22/2011 0944   CREATININE 1.19 07/03/2011 1323      Component Value Date/Time   CALCIUM 8.8 10/22/2011 0944   CALCIUM 9.1 07/03/2011 1323   CALCIUM 9.4 10/14/2007 0110   ALKPHOS 76 10/22/2011 0944   ALKPHOS 68 07/03/2011 1323   AST 23 10/22/2011 0944   AST 19 07/03/2011 1323   ALT 14 07/03/2011 1323   BILITOT 1.10 10/22/2011 0944   BILITOT 0.8 07/03/2011 1323     CT CHEST, ABDOMEN AND PELVIS WITH CONTRAST  Technique: Multidetector CT imaging of the chest, abdomen and  pelvis was performed following the standard protocol during bolus  administration of intravenous contrast.  Contrast: 80mL OMNIPAQUE IOHEXOL 300 MG/ML SOLN  Comparison: Chest , abdomen and pelvic CT 02/03/2011  CT CHEST  Findings: There are stable postsurgical changes status post right  lower lobectomy. Peripheral fibrotic changes in both lungs are  stable. There is stable biapical scarring with asymmetric  component anteriorly in the right upper lobe. No new or enlarging  nodules are identified. There is no endobronchial lesion.  11 mm right paratracheal node on image 28 is stable. There is no  progressive mediastinal or hilar  lymphadenopathy. There is no  pleural or pericardial effusion.  There is extensive atherosclerosis status post CABG. The  brachiocephalic and left common carotid arteries share a common  origin from the aortic arch. There is stenosis of the origin of  the left subclavian artery. No destructive osseous lesions are  identified.  IMPRESSION:  1. Stable postsurgical changes status post right lower lobe  resection.  2. Stable fibrosis, emphysema and scattered parenchymal scarring.  3. Stable mildly prominent right paratracheal lymph node.  4. No evidence of local recurrence or metastatic disease.  5. Extensive atherosclerosis.  CT ABDOMEN AND PELVIS  Findings: The liver is imaged prior to portal vein opacification.  No liver lesions are identified. The gallbladder, biliary system  and pancreas appear normal. The spleen and adrenal glands appear  normal. There is a stable right renal cyst and left renal  scarring.  There are no enlarged abdominal pelvic lymph nodes. No bowel  lesions are identified. There are new surgical clips or  brachytherapy seeds within the prostate gland. There is diffuse  bladder wall thickening as noted previously.  There is extensive atherosclerosis  of the aorta, visceral and iliac  arteries. There is apparent chronic ostial stenosis at the celiac  trunk. The splenic artery may arise separately from the aorta. No  acute vascular occlusion is identified. There are no suspicious  osseous findings.  IMPRESSION:  1. No evidence of abdominal pelvic metastatic disease.  2. Extensive atherosclerosis with apparent chronic ostial stenosis  at the celiac trunk.  3. Chronic bladder wall thickening with new surgical clips or  brachytherapy seeds in the prostate gland.      Impression and Plan: .  A 78 year old gentleman with the following issues: 1. Stage IB lung cancer status post surgical resection.  Continues to have no evidence of recurrent disease.  Continue on  active surveillance. CT scan did not show any relapse disease. I will obtain repeat CT scan in 8 months and follow up in 4 months. 2. Anemia, multifactorial.  Continue to receive Aranesp to keep his hemoglobin above 10.  Aranesp will be 300 mcg every 4 weeks. 3.     T1c Gleason score 8 adenocarcinoma of the prostate with a PSA of 14.2.  He is S/P  radiation therapy without evidence of relapse.   [facility], MD 5/17/201310:43 AM

## 2011-10-24 NOTE — Telephone Encounter (Signed)
appts mad and printed for pt aom 

## 2011-11-21 ENCOUNTER — Ambulatory Visit (INDEPENDENT_AMBULATORY_CARE_PROVIDER_SITE_OTHER): Payer: Medicare Other | Admitting: Pharmacist

## 2011-11-21 DIAGNOSIS — Z8679 Personal history of other diseases of the circulatory system: Secondary | ICD-10-CM

## 2011-11-21 DIAGNOSIS — I4891 Unspecified atrial fibrillation: Secondary | ICD-10-CM

## 2011-11-21 LAB — POCT INR: INR: 3.1

## 2011-11-24 ENCOUNTER — Other Ambulatory Visit (HOSPITAL_BASED_OUTPATIENT_CLINIC_OR_DEPARTMENT_OTHER): Payer: Medicare Other

## 2011-11-24 ENCOUNTER — Ambulatory Visit: Payer: Medicare Other

## 2011-11-24 ENCOUNTER — Encounter: Payer: Self-pay | Admitting: Radiation Oncology

## 2011-11-24 ENCOUNTER — Ambulatory Visit
Admission: RE | Admit: 2011-11-24 | Discharge: 2011-11-24 | Disposition: A | Discharge status: Home / Self Care | Payer: Medicare Other | Source: Ambulatory Visit | Attending: Radiation Oncology | Admitting: Radiation Oncology

## 2011-11-24 VITALS — BP 116/59 | HR 68 | Temp 97.8°F | Resp 20 | Wt 152.8 lb

## 2011-11-24 DIAGNOSIS — C61 Malignant neoplasm of prostate: Secondary | ICD-10-CM

## 2011-11-24 DIAGNOSIS — I6529 Occlusion and stenosis of unspecified carotid artery: Secondary | ICD-10-CM

## 2011-11-24 DIAGNOSIS — M069 Rheumatoid arthritis, unspecified: Secondary | ICD-10-CM

## 2011-11-24 DIAGNOSIS — E785 Hyperlipidemia, unspecified: Secondary | ICD-10-CM

## 2011-11-24 DIAGNOSIS — M949 Disorder of cartilage, unspecified: Secondary | ICD-10-CM

## 2011-11-24 DIAGNOSIS — K649 Unspecified hemorrhoids: Secondary | ICD-10-CM

## 2011-11-24 DIAGNOSIS — I4891 Unspecified atrial fibrillation: Secondary | ICD-10-CM

## 2011-11-24 DIAGNOSIS — D649 Anemia, unspecified: Secondary | ICD-10-CM

## 2011-11-24 DIAGNOSIS — M899 Disorder of bone, unspecified: Secondary | ICD-10-CM

## 2011-11-24 DIAGNOSIS — Z79899 Other long term (current) drug therapy: Secondary | ICD-10-CM | POA: Insufficient documentation

## 2011-11-24 DIAGNOSIS — I2510 Atherosclerotic heart disease of native coronary artery without angina pectoris: Secondary | ICD-10-CM

## 2011-11-24 DIAGNOSIS — C3490 Malignant neoplasm of unspecified part of unspecified bronchus or lung: Secondary | ICD-10-CM

## 2011-11-24 DIAGNOSIS — N182 Chronic kidney disease, stage 2 (mild): Secondary | ICD-10-CM

## 2011-11-24 DIAGNOSIS — Z860100 Personal history of colon polyps, unspecified: Secondary | ICD-10-CM

## 2011-11-24 DIAGNOSIS — Z7901 Long term (current) use of anticoagulants: Secondary | ICD-10-CM

## 2011-11-24 DIAGNOSIS — Z8679 Personal history of other diseases of the circulatory system: Secondary | ICD-10-CM

## 2011-11-24 DIAGNOSIS — R300 Dysuria: Secondary | ICD-10-CM | POA: Insufficient documentation

## 2011-11-24 DIAGNOSIS — I509 Heart failure, unspecified: Secondary | ICD-10-CM

## 2011-11-24 LAB — CBC WITH DIFFERENTIAL/PLATELET
BASO%: 4 % — ABNORMAL HIGH (ref 0.0–2.0)
Eosinophils Absolute: 0.2 10*3/uL (ref 0.0–0.5)
MCHC: 31.8 g/dL — ABNORMAL LOW (ref 32.0–36.0)
MONO#: 0.8 10*3/uL (ref 0.1–0.9)
NEUT#: 2.2 10*3/uL (ref 1.5–6.5)
RBC: 3.66 10*6/uL — ABNORMAL LOW (ref 4.20–5.82)
WBC: 4.2 10*3/uL (ref 4.0–10.3)
lymph#: 0.8 10*3/uL — ABNORMAL LOW (ref 0.9–3.3)

## 2011-11-24 LAB — URINALYSIS, MICROSCOPIC - CHCC
Glucose: NEGATIVE g/dL
Nitrite: NEGATIVE
Specific Gravity, Urine: 1.02 (ref 1.003–1.035)
pH: 5 (ref 4.6–8.0)

## 2011-11-24 MED ORDER — DARBEPOETIN ALFA-POLYSORBATE 300 MCG/0.6ML IJ SOLN
300.0000 ug | Freq: Once | INTRAMUSCULAR | Status: DC
  Filled 2011-11-24: qty 0.6

## 2011-11-24 NOTE — Progress Notes (Signed)
Radiation Oncology         [phone] ________________________________  Name: [name] MRN: [id number]  Date: 11/24/2011  DOB: 24-Feb-1932  Follow-Up Visit Note  CC: Judie Petit, MD  Garnett Farm, MD  Diagnosis:   T1 C adenocarcinoma prostate  Interval Since Last Radiation:  7 months  Narrative:  The patient returns today for routine follow-up.  He continues to followup closely with Dr. Vernie Ammons. According to patient his last PSA was less than 1. I have requested a copy of this report. Over the past few days this has noticed some dysuria and has strong odor to his urine as well as dark yellow  color to his urine. In light of this issue the patient will undergo a UA C&S to  rule out infection.      He denies any bowel complaints or rectal bleeding.                        ALLERGIES:   has no known allergies.  Meds: Current Outpatient Prescriptions  Medication Sig Dispense Refill  . acetaminophen (TYLENOL) 325 MG tablet Take 650 mg by mouth every 6 (six) hours as needed.        Marland Kitchen alendronate (FOSAMAX) 70 MG tablet TAKE 1 TABLET BY MOUTH EVERY WEEK TAKE WITH 8 OUNCES OF WATER AND ON EMPTY STOMACH AS DIRECTED  12 tablet  3  . aspirin 81 MG tablet Take 81 mg by mouth daily.        Marland Kitchen diltiazem (CARDIZEM CD) 120 MG 24 hr capsule TAKE ONE CAPSULE BY MOUTH EVERY DAY  90 capsule  3  . ferrous sulfate 325 (65 FE) MG tablet TAKE 1 TABLET BY MOUTH DAILY.  90 tablet  3  . finasteride (PROSCAR) 5 MG tablet TAKE 1 TABLET BY MOUTH EVERY DAY  90 tablet  3  . fluticasone (FLONASE) 50 MCG/ACT nasal spray Place 2 sprays into the nose daily.  16 g  6  . folic acid (FOLVITE) 400 MCG tablet Take 400 mcg by mouth daily.        . methotrexate (RHEUMATREX) 2.5 MG tablet Take 2.5 mg by mouth once a week. Caution:Chemotherapy. Protect from light. 8 tablets.      . metoprolol succinate (TOPROL-XL) 100 MG 24 hr tablet Take 1 tablet (100 mg total) by mouth daily.  90 tablet  1  . simvastatin (ZOCOR)  40 MG tablet TAKE 1 TABLET BY MOUTH AT BEDTIME  90 tablet  4  . warfarin (COUMADIN) 5 MG tablet       . lisinopril (PRINIVIL,ZESTRIL) 20 MG tablet TAKE 1 TABLET EVERY DAY BY MOUTH AS DIRECTED  90 tablet  4    Physical Findings: The patient is in no acute distress. Patient is alert and oriented.  weight is 152 lb 12.8 oz (69.31 kg). His oral temperature is 97.8 F (36.6 C). His blood pressure is 116/59 and his pulse is 68. His respiration is 20. Marland Kitchen  There is no palpable cervical or supraclavicular adenopathy. The axillary areas are free of adenopathy. The lungs are clear to auscultation. The heart has regular rhythm and rate. The abdomen is soft and nontender with normal bowel sounds. A prostate exam is not performed in light of recent exam by Dr. Vernie Ammons.  Lab Findings: Lab Results  Component Value Date   WBC 4.3 10/22/2011   HGB 11.8* 10/22/2011   HCT 36.6* 10/22/2011   MCV 91.9 10/22/2011   PLT  316 10/22/2011    @LASTCHEM @  Radiographic Findings: No results found.  Impression:  The patient is recovering from the effects of radiation. His PSA seems to be dropping nicely.  As above the patient will undergo a UA C&S to rule out bladder infection. In light of the patient's close followup with Dr. Vernie Ammons, I  have not scheduled patient for formal followup appointment but would be glad to see him anytime.   _____________________________________    Billie Lade, PhD, MD

## 2011-11-24 NOTE — Progress Notes (Signed)
Patient alert,oriented x3, , no changes in medications, start of last week dysuria at first starting his stream, nocturia again,up every hour , hour and half", sometimwes has urgency feels like he has to go but goes just a litle bit"stated patient Bowel movements normal 8:12 AM

## 2011-11-26 ENCOUNTER — Other Ambulatory Visit: Payer: Self-pay | Admitting: Oncology

## 2011-11-26 LAB — URINE CULTURE

## 2011-11-27 ENCOUNTER — Telehealth: Payer: Self-pay | Admitting: *Deleted

## 2011-11-27 NOTE — Telephone Encounter (Signed)
Per Dr Roselind Messier, called pt and informed him his UA on 11/24/11 was normal, no infection. Pt verbalized understanding.

## 2011-12-22 ENCOUNTER — Ambulatory Visit: Payer: Medicare Other

## 2011-12-22 ENCOUNTER — Other Ambulatory Visit (HOSPITAL_BASED_OUTPATIENT_CLINIC_OR_DEPARTMENT_OTHER): Payer: Medicare Other

## 2011-12-22 DIAGNOSIS — E785 Hyperlipidemia, unspecified: Secondary | ICD-10-CM

## 2011-12-22 DIAGNOSIS — C61 Malignant neoplasm of prostate: Secondary | ICD-10-CM

## 2011-12-22 DIAGNOSIS — Z860100 Personal history of colon polyps, unspecified: Secondary | ICD-10-CM

## 2011-12-22 DIAGNOSIS — M069 Rheumatoid arthritis, unspecified: Secondary | ICD-10-CM

## 2011-12-22 DIAGNOSIS — Z8679 Personal history of other diseases of the circulatory system: Secondary | ICD-10-CM

## 2011-12-22 DIAGNOSIS — I4891 Unspecified atrial fibrillation: Secondary | ICD-10-CM

## 2011-12-22 DIAGNOSIS — D649 Anemia, unspecified: Secondary | ICD-10-CM

## 2011-12-22 DIAGNOSIS — N182 Chronic kidney disease, stage 2 (mild): Secondary | ICD-10-CM

## 2011-12-22 DIAGNOSIS — I509 Heart failure, unspecified: Secondary | ICD-10-CM

## 2011-12-22 DIAGNOSIS — M899 Disorder of bone, unspecified: Secondary | ICD-10-CM

## 2011-12-22 DIAGNOSIS — I6529 Occlusion and stenosis of unspecified carotid artery: Secondary | ICD-10-CM

## 2011-12-22 DIAGNOSIS — K649 Unspecified hemorrhoids: Secondary | ICD-10-CM

## 2011-12-22 DIAGNOSIS — M949 Disorder of cartilage, unspecified: Secondary | ICD-10-CM

## 2011-12-22 DIAGNOSIS — C3490 Malignant neoplasm of unspecified part of unspecified bronchus or lung: Secondary | ICD-10-CM

## 2011-12-22 DIAGNOSIS — I2510 Atherosclerotic heart disease of native coronary artery without angina pectoris: Secondary | ICD-10-CM

## 2011-12-22 DIAGNOSIS — Z7901 Long term (current) use of anticoagulants: Secondary | ICD-10-CM

## 2011-12-22 LAB — CBC WITH DIFFERENTIAL/PLATELET
BASO%: 3 % — ABNORMAL HIGH (ref 0.0–2.0)
Eosinophils Absolute: 0.3 10*3/uL (ref 0.0–0.5)
HCT: 31.2 % — ABNORMAL LOW (ref 38.4–49.9)
MCHC: 32.2 g/dL (ref 32.0–36.0)
MONO#: 0.5 10*3/uL (ref 0.1–0.9)
NEUT#: 2.3 10*3/uL (ref 1.5–6.5)
NEUT%: 57.5 % (ref 39.0–75.0)
RBC: 3.51 10*6/uL — ABNORMAL LOW (ref 4.20–5.82)
WBC: 4 10*3/uL (ref 4.0–10.3)
lymph#: 0.8 10*3/uL — ABNORMAL LOW (ref 0.9–3.3)

## 2011-12-22 MED ORDER — DARBEPOETIN ALFA-POLYSORBATE 300 MCG/0.6ML IJ SOLN
300.0000 ug | Freq: Once | INTRAMUSCULAR | Status: DC
  Filled 2011-12-22: qty 0.6

## 2011-12-27 ENCOUNTER — Other Ambulatory Visit: Payer: Self-pay | Admitting: Internal Medicine

## 2012-01-02 ENCOUNTER — Ambulatory Visit (INDEPENDENT_AMBULATORY_CARE_PROVIDER_SITE_OTHER): Payer: Medicare Other | Admitting: *Deleted

## 2012-01-02 DIAGNOSIS — Z8679 Personal history of other diseases of the circulatory system: Secondary | ICD-10-CM

## 2012-01-02 DIAGNOSIS — I4891 Unspecified atrial fibrillation: Secondary | ICD-10-CM

## 2012-01-02 LAB — POCT INR: INR: 4.6

## 2012-01-16 ENCOUNTER — Ambulatory Visit (INDEPENDENT_AMBULATORY_CARE_PROVIDER_SITE_OTHER): Payer: Medicare Other | Admitting: Pharmacist

## 2012-01-16 DIAGNOSIS — I4891 Unspecified atrial fibrillation: Secondary | ICD-10-CM

## 2012-01-16 DIAGNOSIS — Z8679 Personal history of other diseases of the circulatory system: Secondary | ICD-10-CM

## 2012-01-19 ENCOUNTER — Other Ambulatory Visit (HOSPITAL_BASED_OUTPATIENT_CLINIC_OR_DEPARTMENT_OTHER): Payer: Medicare Other

## 2012-01-19 ENCOUNTER — Ambulatory Visit (HOSPITAL_BASED_OUTPATIENT_CLINIC_OR_DEPARTMENT_OTHER): Payer: Medicare Other

## 2012-01-19 VITALS — BP 86/45 | HR 62 | Temp 97.0°F

## 2012-01-19 DIAGNOSIS — Z860100 Personal history of colon polyps, unspecified: Secondary | ICD-10-CM

## 2012-01-19 DIAGNOSIS — D649 Anemia, unspecified: Secondary | ICD-10-CM

## 2012-01-19 DIAGNOSIS — I2510 Atherosclerotic heart disease of native coronary artery without angina pectoris: Secondary | ICD-10-CM

## 2012-01-19 DIAGNOSIS — C3490 Malignant neoplasm of unspecified part of unspecified bronchus or lung: Secondary | ICD-10-CM

## 2012-01-19 DIAGNOSIS — I509 Heart failure, unspecified: Secondary | ICD-10-CM

## 2012-01-19 DIAGNOSIS — I6529 Occlusion and stenosis of unspecified carotid artery: Secondary | ICD-10-CM

## 2012-01-19 DIAGNOSIS — M949 Disorder of cartilage, unspecified: Secondary | ICD-10-CM

## 2012-01-19 DIAGNOSIS — Z8679 Personal history of other diseases of the circulatory system: Secondary | ICD-10-CM

## 2012-01-19 DIAGNOSIS — K649 Unspecified hemorrhoids: Secondary | ICD-10-CM

## 2012-01-19 DIAGNOSIS — E785 Hyperlipidemia, unspecified: Secondary | ICD-10-CM

## 2012-01-19 DIAGNOSIS — Z7901 Long term (current) use of anticoagulants: Secondary | ICD-10-CM

## 2012-01-19 DIAGNOSIS — M899 Disorder of bone, unspecified: Secondary | ICD-10-CM

## 2012-01-19 DIAGNOSIS — C61 Malignant neoplasm of prostate: Secondary | ICD-10-CM

## 2012-01-19 DIAGNOSIS — M069 Rheumatoid arthritis, unspecified: Secondary | ICD-10-CM

## 2012-01-19 DIAGNOSIS — N182 Chronic kidney disease, stage 2 (mild): Secondary | ICD-10-CM

## 2012-01-19 DIAGNOSIS — I4891 Unspecified atrial fibrillation: Secondary | ICD-10-CM

## 2012-01-19 LAB — CBC WITH DIFFERENTIAL/PLATELET
Eosinophils Absolute: 0.3 10*3/uL (ref 0.0–0.5)
MONO#: 0.8 10*3/uL (ref 0.1–0.9)
NEUT#: 2.3 10*3/uL (ref 1.5–6.5)
RBC: 3.22 10*6/uL — ABNORMAL LOW (ref 4.20–5.82)
RDW: 16.5 % — ABNORMAL HIGH (ref 11.0–14.6)
WBC: 4.5 10*3/uL (ref 4.0–10.3)

## 2012-01-19 MED ORDER — DARBEPOETIN ALFA-POLYSORBATE 300 MCG/0.6ML IJ SOLN
300.0000 ug | Freq: Once | INTRAMUSCULAR | Status: AC
  Administered 2012-01-19: 300 ug via SUBCUTANEOUS

## 2012-01-30 ENCOUNTER — Ambulatory Visit (INDEPENDENT_AMBULATORY_CARE_PROVIDER_SITE_OTHER): Payer: Medicare Other | Admitting: Pharmacist

## 2012-01-30 DIAGNOSIS — Z8679 Personal history of other diseases of the circulatory system: Secondary | ICD-10-CM

## 2012-01-30 DIAGNOSIS — I4891 Unspecified atrial fibrillation: Secondary | ICD-10-CM

## 2012-01-30 LAB — POCT INR: INR: 2.5

## 2012-02-23 ENCOUNTER — Other Ambulatory Visit: Payer: Self-pay | Admitting: Medical Oncology

## 2012-02-23 ENCOUNTER — Other Ambulatory Visit (HOSPITAL_BASED_OUTPATIENT_CLINIC_OR_DEPARTMENT_OTHER): Payer: Medicare Other | Admitting: Lab

## 2012-02-23 ENCOUNTER — Ambulatory Visit: Payer: Medicare Other | Admitting: Internal Medicine

## 2012-02-23 ENCOUNTER — Ambulatory Visit: Payer: Medicare Other

## 2012-02-23 ENCOUNTER — Telehealth: Payer: Self-pay | Admitting: Oncology

## 2012-02-23 VITALS — BP 112/58 | HR 55 | Temp 96.9°F | Resp 18 | Ht 70.0 in | Wt 149.4 lb

## 2012-02-23 DIAGNOSIS — M069 Rheumatoid arthritis, unspecified: Secondary | ICD-10-CM

## 2012-02-23 DIAGNOSIS — D649 Anemia, unspecified: Secondary | ICD-10-CM

## 2012-02-23 DIAGNOSIS — Z7901 Long term (current) use of anticoagulants: Secondary | ICD-10-CM

## 2012-02-23 DIAGNOSIS — E785 Hyperlipidemia, unspecified: Secondary | ICD-10-CM

## 2012-02-23 DIAGNOSIS — Z86010 Personal history of colonic polyps: Secondary | ICD-10-CM

## 2012-02-23 DIAGNOSIS — I509 Heart failure, unspecified: Secondary | ICD-10-CM

## 2012-02-23 DIAGNOSIS — C3490 Malignant neoplasm of unspecified part of unspecified bronchus or lung: Secondary | ICD-10-CM

## 2012-02-23 DIAGNOSIS — I6529 Occlusion and stenosis of unspecified carotid artery: Secondary | ICD-10-CM

## 2012-02-23 DIAGNOSIS — C61 Malignant neoplasm of prostate: Secondary | ICD-10-CM

## 2012-02-23 DIAGNOSIS — M949 Disorder of cartilage, unspecified: Secondary | ICD-10-CM

## 2012-02-23 DIAGNOSIS — I2510 Atherosclerotic heart disease of native coronary artery without angina pectoris: Secondary | ICD-10-CM

## 2012-02-23 DIAGNOSIS — K649 Unspecified hemorrhoids: Secondary | ICD-10-CM

## 2012-02-23 DIAGNOSIS — Z8679 Personal history of other diseases of the circulatory system: Secondary | ICD-10-CM

## 2012-02-23 DIAGNOSIS — N182 Chronic kidney disease, stage 2 (mild): Secondary | ICD-10-CM

## 2012-02-23 DIAGNOSIS — I4891 Unspecified atrial fibrillation: Secondary | ICD-10-CM

## 2012-02-23 LAB — COMPREHENSIVE METABOLIC PANEL (CC13)
AST: 20 U/L (ref 5–34)
Albumin: 3.9 g/dL (ref 3.5–5.0)
BUN: 27 mg/dL — ABNORMAL HIGH (ref 7.0–26.0)
Calcium: 9.8 mg/dL (ref 8.4–10.4)
Chloride: 104 mEq/L (ref 98–107)
Potassium: 5.1 mEq/L (ref 3.5–5.1)

## 2012-02-23 LAB — CBC WITH DIFFERENTIAL/PLATELET
BASO%: 2.9 % — ABNORMAL HIGH (ref 0.0–2.0)
Basophils Absolute: 0.1 10*3/uL (ref 0.0–0.1)
HCT: 35.6 % — ABNORMAL LOW (ref 38.4–49.9)
HGB: 11.1 g/dL — ABNORMAL LOW (ref 13.0–17.1)
MCHC: 31.2 g/dL — ABNORMAL LOW (ref 32.0–36.0)
MONO#: 0.8 10*3/uL (ref 0.1–0.9)
NEUT#: 1.9 10*3/uL (ref 1.5–6.5)
NEUT%: 46.8 % (ref 39.0–75.0)
WBC: 4.1 10*3/uL (ref 4.0–10.3)
lymph#: 1 10*3/uL (ref 0.9–3.3)

## 2012-02-23 MED ORDER — DARBEPOETIN ALFA-POLYSORBATE 300 MCG/0.6ML IJ SOLN: 300.0000 ug | Freq: Once | INTRAMUSCULAR | Status: DC

## 2012-02-23 NOTE — Telephone Encounter (Signed)
I sch pt with dr mkm and it should have been with kc,appt fixed and printed for pt aom

## 2012-02-24 ENCOUNTER — Telehealth: Payer: Self-pay | Admitting: Oncology

## 2012-02-24 ENCOUNTER — Ambulatory Visit (HOSPITAL_BASED_OUTPATIENT_CLINIC_OR_DEPARTMENT_OTHER): Payer: Medicare Other | Admitting: Oncology

## 2012-02-24 ENCOUNTER — Encounter: Payer: Self-pay | Admitting: Oncology

## 2012-02-24 VITALS — BP 111/54 | HR 70 | Temp 96.7°F | Resp 20 | Ht 70.0 in | Wt 150.1 lb

## 2012-02-24 DIAGNOSIS — C61 Malignant neoplasm of prostate: Secondary | ICD-10-CM

## 2012-02-24 DIAGNOSIS — C3490 Malignant neoplasm of unspecified part of unspecified bronchus or lung: Secondary | ICD-10-CM

## 2012-02-24 DIAGNOSIS — C3410 Malignant neoplasm of upper lobe, unspecified bronchus or lung: Secondary | ICD-10-CM

## 2012-02-24 DIAGNOSIS — D649 Anemia, unspecified: Secondary | ICD-10-CM

## 2012-02-24 NOTE — Progress Notes (Signed)
Patient of Dr. Clelia Croft mistakenly placed on my schedule.

## 2012-02-24 NOTE — Progress Notes (Signed)
Hematology and Oncology Follow Up Visit  MANSEL STROTHER [id number] May 25, 1932 78 y.o. 02/24/2012 1:15 PM  CC: Valetta Mole. Cato Mulligan, MD  Ines Bloomer, M.D.  Di Kindle. [name], M.D.    Principle Diagnosis: This is a 78 year old gentleman with the following diagnoses: 1. 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 2. Multifactorial anemia probably due to chronic disease and rheumatoid arthritis. 3. History of a stage T1c Gleason score 8 adenocarcinoma of the prostate S/P external beam radiation under the care of Dr. Roselind Messier completed in 04/2011.  Current therapy:  1. He is on Aranesp at 300 mcg every 4 weeks to keep his hemoglobin above 10. 2. Watchful observation for his lung and prostate cancer.  Interim History:  This is a pleasant gentleman with the above history.  He presents today for a follow-up visit.  Since the last time I saw him, he had not reported any recent illnesses or hospitalization, had not had any weakness, fatigue.  He does report some occasional tiredness.  He has not reported any chest pain, has not reported any difficulty breathing, has not reported any cough or deterioration in his health. He continues to be on methotrexate on weekly bases for his rheumatoid arthritis.  No respiratory symptoms noted. No hemoptysis, hematemesis, hematuria, or melena.   Medications: I have reviewed the patient's current medications. Current outpatient prescriptions:acetaminophen (TYLENOL) 325 MG tablet, Take 650 mg by mouth every 6 (six) hours as needed.  , Disp: , Rfl: ;  alendronate (FOSAMAX) 70 MG tablet, TAKE 1 TABLET BY MOUTH EVERY WEEK TAKE WITH 8 OUNCES OF WATER AND ON EMPTY STOMACH AS DIRECTED, Disp: 12 tablet, Rfl: 3;  aspirin 81 MG tablet, Take 81 mg by mouth daily.  , Disp: , Rfl:  diltiazem (CARDIZEM CD) 120 MG 24 hr capsule, TAKE ONE CAPSULE BY MOUTH EVERY DAY, Disp: 90 capsule, Rfl: 3;  finasteride (PROSCAR) 5  MG tablet, TAKE 1 TABLET BY MOUTH EVERY DAY, Disp: 90 tablet, Rfl: 3;  fluticasone (FLONASE) 50 MCG/ACT nasal spray, Place 2 sprays into the nose daily., Disp: 16 g, Rfl: 6;  folic acid (FOLVITE) 400 MCG tablet, Take 400 mcg by mouth daily.  , Disp: , Rfl:  lisinopril (PRINIVIL,ZESTRIL) 20 MG tablet, TAKE 1 TABLET EVERY DAY BY MOUTH AS DIRECTED, Disp: 90 tablet, Rfl: 4;  methotrexate (RHEUMATREX) 2.5 MG tablet, Take 2.5 mg by mouth once a week. Caution:Chemotherapy. Protect from light. 8 tablets., Disp: , Rfl: ;  metoprolol succinate (TOPROL-XL) 100 MG 24 hr tablet, TAKE 1 TABLET BY MOUTH EVERY DAY, Disp: 90 tablet, Rfl: 1 simvastatin (ZOCOR) 40 MG tablet, TAKE 1 TABLET BY MOUTH AT BEDTIME, Disp: 90 tablet, Rfl: 4;  warfarin (COUMADIN) 5 MG tablet, , Disp: , Rfl:   Allergies: No Known Allergies  Past Medical History, Surgical history, Social history, and Family History were reviewed and updated.  Review of Systems: Constitutional:  Negative for fever, chills, night sweats, anorexia, weight loss, pain. Cardiovascular: no chest pain or dyspnea on exertion Respiratory: no cough, shortness of breath, or wheezing Neurological: no TIA or stroke symptoms Dermatological: negative ENT: negative Skin: Negative. Gastrointestinal: no abdominal pain, change in bowel habits, or black or bloody stools Genito-Urinary: no dysuria, trouble voiding, or hematuria Hematological and Lymphatic: negative Breast: negative Musculoskeletal: negative Remaining ROS negative.  Physical Exam: Blood pressure 111/54, pulse 70, temperature 96.7 F (35.9 C), temperature source Oral, resp. rate 20, height 5\' 10"  (1.778  m), weight 150 lb 1.6 oz (68.085 kg). ECOG: 1 General appearance: alert Head: Normocephalic, without obvious abnormality, atraumatic Neck: no adenopathy, no carotid bruit, no JVD, supple, symmetrical, trachea midline and thyroid not enlarged, symmetric, no tenderness/mass/nodules Lymph nodes: Cervical,  supraclavicular, and axillary nodes normal. Heart:regular rate and rhythm, S1, S2 normal, no murmur, click, rub or gallop Lung:chest clear, no wheezing, rales, normal symmetric air entry Abdomen: soft, non-tender, without masses or organomegaly EXT:no erythema, induration, or nodules  Lab Results: Lab Results  Component Value Date   WBC 4.1 02/23/2012   HGB 11.1* 02/23/2012   HCT 35.6* 02/23/2012   MCV 92.7 02/23/2012   PLT 447* 02/23/2012     Chemistry      Component Value Date/Time   NA 137 02/23/2012 1031   NA 141 10/22/2011 0944   NA 138 07/03/2011 1323   K 5.1 02/23/2012 1031   K 4.8* 10/22/2011 0944   K 4.5 07/03/2011 1323   CL 104 02/23/2012 1031   CL 98 10/22/2011 0944   CL 104 07/03/2011 1323   CO2 25 02/23/2012 1031   CO2 26 10/22/2011 0944   CO2 25 07/03/2011 1323   BUN 27.0* 02/23/2012 1031   BUN 29* 10/22/2011 0944   BUN 22 07/03/2011 1323   CREATININE 1.6* 02/23/2012 1031   CREATININE 1.5* 10/22/2011 0944   CREATININE 1.19 07/03/2011 1323      Component Value Date/Time   CALCIUM 9.8 02/23/2012 1031   CALCIUM 8.8 10/22/2011 0944   CALCIUM 9.1 07/03/2011 1323   CALCIUM 9.4 10/14/2007 0110   ALKPHOS 85 02/23/2012 1031   ALKPHOS 76 10/22/2011 0944   ALKPHOS 68 07/03/2011 1323   AST 20 02/23/2012 1031   AST 23 10/22/2011 0944   AST 19 07/03/2011 1323   ALT 16 02/23/2012 1031   ALT 14 07/03/2011 1323   BILITOT 0.70 02/23/2012 1031   BILITOT 1.10 10/22/2011 0944   BILITOT 0.8 07/03/2011 1323      Impression and Plan: .  A 78 year old gentleman with the following issues: 1. Stage IB lung cancer status post surgical resection.  Continues to have no evidence of recurrent disease.  Continue on active surveillance. He is due for a restaging CT scan in about 4 months which I have scheduled today. 2. Anemia, multifactorial.  Continue to receive Aranesp to keep his hemoglobin above 10.  Aranesp will be 300 mcg every 4 weeks. Aranesp injection was held today due to Hgb of 11.1. 3. T1c Gleason  score 8 adenocarcinoma of the prostate with a PSA of 14.2.  He is S/P  radiation therapy without evidence of relapse. PSA is followed by Urology. 4. Follow-up. In 4 months following his CT scan.  Clenton Pare                  9/17/20131:15 PM

## 2012-02-24 NOTE — Telephone Encounter (Signed)
gv pt appt schedule for October 2013 thru January 2014 including ct scan.

## 2012-02-27 ENCOUNTER — Ambulatory Visit (INDEPENDENT_AMBULATORY_CARE_PROVIDER_SITE_OTHER): Payer: Medicare Other | Admitting: *Deleted

## 2012-02-27 DIAGNOSIS — I4891 Unspecified atrial fibrillation: Secondary | ICD-10-CM

## 2012-02-27 DIAGNOSIS — Z8679 Personal history of other diseases of the circulatory system: Secondary | ICD-10-CM

## 2012-03-15 ENCOUNTER — Encounter: Payer: Self-pay | Admitting: Internal Medicine

## 2012-03-15 ENCOUNTER — Ambulatory Visit (INDEPENDENT_AMBULATORY_CARE_PROVIDER_SITE_OTHER): Payer: Medicare Other | Admitting: Internal Medicine

## 2012-03-15 VITALS — BP 102/58 | HR 64 | Temp 98.2°F | Wt 148.0 lb

## 2012-03-15 DIAGNOSIS — Z7901 Long term (current) use of anticoagulants: Secondary | ICD-10-CM

## 2012-03-15 DIAGNOSIS — I4891 Unspecified atrial fibrillation: Secondary | ICD-10-CM

## 2012-03-15 DIAGNOSIS — M069 Rheumatoid arthritis, unspecified: Secondary | ICD-10-CM

## 2012-03-15 DIAGNOSIS — D649 Anemia, unspecified: Secondary | ICD-10-CM

## 2012-03-15 DIAGNOSIS — I2510 Atherosclerotic heart disease of native coronary artery without angina pectoris: Secondary | ICD-10-CM

## 2012-03-15 LAB — LIPID PANEL
Total CHOL/HDL Ratio: 3
Triglycerides: 69 mg/dL (ref 0.0–149.0)

## 2012-03-15 NOTE — Assessment & Plan Note (Signed)
Has regular f/u with warfarin clinic

## 2012-03-15 NOTE — Assessment & Plan Note (Signed)
Has regular f/u with rheumatology

## 2012-03-15 NOTE — Assessment & Plan Note (Signed)
  Has regular f/u at oncology

## 2012-03-15 NOTE — Assessment & Plan Note (Signed)
No sxs Continue with risk factor modification

## 2012-03-15 NOTE — Progress Notes (Signed)
Patient ID: [name], male   DOB: 1931/12/19, 78 y.o.   MRN: [id number] CAD-- no sxs Lipid Panel     Component Value Date/Time   CHOL 166 05/16/2011 0957   TRIG 81.0 05/16/2011 0957   HDL 55.80 05/16/2011 0957   CHOLHDL 3 05/16/2011 0957   VLDL 16.2 05/16/2011 0957   LDLCALC 94 05/16/2011 0957    Lipids-- needs f/u  RA-- followed by rhematology  LUNG CA-- followed by [facility]  Past Medical History  Diagnosis Date  . CARCINOMA, LUNG, SQUAMOUS CELL 08/21/2008  . HYPERLIPIDEMIA 11/13/2006  . ANEMIA 09/07/2009  . MYOCARDIAL INFARCTION, HX OF 11/13/2006  . CORONARY ARTERY DISEASE 11/13/2006  . Atrial fibrillation 11/13/2006  . CONGESTIVE HEART FAILURE 02/01/2007  . CAROTID ARTERY STENOSIS, WITHOUT INFARCTION 09/07/2008  . KIDNEY DISEASE, CHRONIC, STAGE II 10/13/2007  . Rheumatoid arthritis 11/13/2006  . OSTEOPENIA 11/13/2006  . Cancer of prostate 10/24/2009  . TRANSIENT ISCHEMIC ATTACK, HX OF 11/13/2006  . COLONIC POLYPS, HX OF 07/04/2003    6 mm adenoma  . Femoral artery occlusion     right  . PAD (peripheral artery disease)     History   Social History  . Marital Status: Married    Spouse Name: N/A    Number of Children: 4  . Years of Education: N/A   Occupational History  . former truck [employer] doctor    Social History Main Topics  . Smoking status: Former Smoker    Quit date: 06/09/2001  . Smokeless tobacco: Never Used  . Alcohol Use: No  . Drug Use: No  . Sexually Active: Not on file   Other Topics Concern  . Not on file   Social History Narrative  . No narrative on file    Past Surgical History  Procedure Date  . Angioplasty 1995  . Embolectomy   . Femoral artery - popliteal artery bypass graft 06/21/08    rt bellow-knee  . Coronary artery bypass graft 01/31/02  . Cardiac electrophysiology study and ablation     radiofrequency catheter of atrial flutter  . Lobectomy 2009    right lower  . Bilateral vats ablation 2009    right minithoracotomy  . Colonoscopy w/ biopsies and  polypectomy 06/2003, 09/2009    tubular adenoma, diverticulosis,   . Esophagogastroduodenoscopy 09/2009    2 gastric antral ulcers, antral gastritis    Family History  Problem Relation Age of Onset  . Tuberculosis Mother     No Known Allergies  Current Outpatient Prescriptions on File Prior to Visit  Medication Sig Dispense Refill  . acetaminophen (TYLENOL) 325 MG tablet Take 650 mg by mouth every 6 (six) hours as needed.        Marland Kitchen alendronate (FOSAMAX) 70 MG tablet TAKE 1 TABLET BY MOUTH EVERY WEEK TAKE WITH 8 OUNCES OF WATER AND ON EMPTY STOMACH AS DIRECTED  12 tablet  3  . aspirin 81 MG tablet Take 81 mg by mouth daily.        Marland Kitchen diltiazem (CARDIZEM CD) 120 MG 24 hr capsule TAKE ONE CAPSULE BY MOUTH EVERY DAY  90 capsule  3  . finasteride (PROSCAR) 5 MG tablet TAKE 1 TABLET BY MOUTH EVERY DAY  90 tablet  3  . fluticasone (FLONASE) 50 MCG/ACT nasal spray Place 2 sprays into the nose daily.  16 g  6  . folic acid (FOLVITE) 400 MCG tablet Take 400 mcg by mouth daily.        Marland Kitchen leflunomide (ARAVA) 20  MG tablet Take 1 tablet by mouth daily.      Marland Kitchen lisinopril (PRINIVIL,ZESTRIL) 20 MG tablet TAKE 1 TABLET EVERY DAY BY MOUTH AS DIRECTED  90 tablet  4  . methotrexate (RHEUMATREX) 2.5 MG tablet Take 2.5 mg by mouth daily. Caution:Chemotherapy.      . metoprolol succinate (TOPROL-XL) 100 MG 24 hr tablet TAKE 1 TABLET BY MOUTH EVERY DAY  90 tablet  1  . simvastatin (ZOCOR) 40 MG tablet TAKE 1 TABLET BY MOUTH AT BEDTIME  90 tablet  4  . warfarin (COUMADIN) 5 MG tablet 5 mg Tuesdays and Thursdays, 2.5 mg all others         patient denies chest pain, shortness of breath, orthopnea. Denies lower extremity edema, abdominal pain, change in appetite, change in bowel movements. Patient denies rashes, musculoskeletal complaints. No other specific complaints in a complete review of systems.   BP 102/58  Pulse 64  Temp 98.2 F (36.8 C) (Oral)  Wt 148 lb (67.132 kg)  well-developed well-nourished male in  no acute distress. HEENT exam atraumatic, normocephalic, neck supple without jugular venous distention. Chest clear to auscultation cardiac exam S1-S2 are regular. Abdominal exam overweight with bowel sounds, soft and nontender.

## 2012-03-18 ENCOUNTER — Other Ambulatory Visit: Payer: Self-pay | Admitting: *Deleted

## 2012-03-18 DIAGNOSIS — I70219 Atherosclerosis of native arteries of extremities with intermittent claudication, unspecified extremity: Secondary | ICD-10-CM

## 2012-03-18 DIAGNOSIS — Z48812 Encounter for surgical aftercare following surgery on the circulatory system: Secondary | ICD-10-CM

## 2012-03-19 NOTE — Assessment & Plan Note (Signed)
On exam he is in sinus rhythm Continue warfain

## 2012-03-22 ENCOUNTER — Other Ambulatory Visit (HOSPITAL_BASED_OUTPATIENT_CLINIC_OR_DEPARTMENT_OTHER): Payer: Medicare Other

## 2012-03-22 ENCOUNTER — Ambulatory Visit: Payer: Medicare Other

## 2012-03-22 DIAGNOSIS — D649 Anemia, unspecified: Secondary | ICD-10-CM

## 2012-03-22 DIAGNOSIS — C3490 Malignant neoplasm of unspecified part of unspecified bronchus or lung: Secondary | ICD-10-CM

## 2012-03-22 LAB — CBC WITH DIFFERENTIAL/PLATELET
Basophils Absolute: 0.2 10*3/uL — ABNORMAL HIGH (ref 0.0–0.1)
Eosinophils Absolute: 0.3 10*3/uL (ref 0.0–0.5)
HCT: 33.6 % — ABNORMAL LOW (ref 38.4–49.9)
HGB: 10.9 g/dL — ABNORMAL LOW (ref 13.0–17.1)
MCH: 29.9 pg (ref 27.2–33.4)
MONO#: 0.9 10*3/uL (ref 0.1–0.9)
NEUT%: 55.7 % (ref 39.0–75.0)
WBC: 5.3 10*3/uL (ref 4.0–10.3)
lymph#: 1 10*3/uL (ref 0.9–3.3)

## 2012-03-22 MED ORDER — DARBEPOETIN ALFA-POLYSORBATE 300 MCG/0.6ML IJ SOLN
300.0000 ug | INTRAMUSCULAR | Status: DC
  Filled 2012-03-22: qty 0.6

## 2012-03-23 ENCOUNTER — Encounter: Payer: Self-pay | Admitting: Vascular Surgery

## 2012-03-24 ENCOUNTER — Encounter (INDEPENDENT_AMBULATORY_CARE_PROVIDER_SITE_OTHER): Payer: Medicare Other | Admitting: *Deleted

## 2012-03-24 ENCOUNTER — Encounter: Payer: Self-pay | Admitting: Vascular Surgery

## 2012-03-24 ENCOUNTER — Ambulatory Visit (INDEPENDENT_AMBULATORY_CARE_PROVIDER_SITE_OTHER): Payer: Medicare Other | Admitting: Vascular Surgery

## 2012-03-24 VITALS — BP 118/54 | HR 69 | Ht 70.0 in | Wt 148.3 lb

## 2012-03-24 DIAGNOSIS — Z48812 Encounter for surgical aftercare following surgery on the circulatory system: Secondary | ICD-10-CM

## 2012-03-24 DIAGNOSIS — I739 Peripheral vascular disease, unspecified: Secondary | ICD-10-CM

## 2012-03-24 DIAGNOSIS — I70219 Atherosclerosis of native arteries of extremities with intermittent claudication, unspecified extremity: Secondary | ICD-10-CM

## 2012-03-24 DIAGNOSIS — I6529 Occlusion and stenosis of unspecified carotid artery: Secondary | ICD-10-CM

## 2012-03-24 NOTE — Progress Notes (Signed)
[facility] [place]  Patient name: [name] MRN: [id number] DOB: [birth year] Sex: male  REASON FOR VISIT: follow up of infrainguinal arterial occlusive disease.  HPI: [name] is a 78 y.o. male who is undergone a previous right femoral to below knee pop 2 artery bypass graft with a vein graft in January of 2010. He has severe tibial artery occlusive disease with single vessel runoff via the peroneal artery on the right. He comes in for a routine follow up visit. He denies claudication, rest pain, or nonhealing ulcers. He has had no history of stroke, TIAs, expressive or receptive aphasia, or amaurosis fugax.  Past Medical History  Diagnosis Date  . CARCINOMA, LUNG, SQUAMOUS CELL 08/21/2008  . HYPERLIPIDEMIA 11/13/2006  . ANEMIA 09/07/2009  . MYOCARDIAL INFARCTION, HX OF 11/13/2006  . CORONARY ARTERY DISEASE 11/13/2006  . Atrial fibrillation 11/13/2006  . CONGESTIVE HEART FAILURE 02/01/2007  . CAROTID ARTERY STENOSIS, WITHOUT INFARCTION 09/07/2008  . KIDNEY DISEASE, CHRONIC, STAGE II 10/13/2007  . Rheumatoid arthritis 11/13/2006  . OSTEOPENIA 11/13/2006  . Cancer of prostate 10/24/2009  . TRANSIENT ISCHEMIC ATTACK, HX OF 11/13/2006  . COLONIC POLYPS, HX OF 07/04/2003    6 mm adenoma  . Femoral artery occlusion     right  . PAD (peripheral artery disease)     Family History  Problem Relation Age of Onset  . Tuberculosis Mother     SOCIAL HISTORY: History  Substance Use Topics  . Smoking status: Former Smoker    Quit date: 06/09/2001  . Smokeless tobacco: Never Used  . Alcohol Use: No    No Known Allergies  Current Outpatient Prescriptions  Medication Sig Dispense Refill  . acetaminophen (TYLENOL) 325 MG tablet Take 650 mg by mouth every 6 (six) hours as needed.        Marland Kitchen alendronate (FOSAMAX) 70 MG tablet TAKE 1 TABLET BY MOUTH EVERY WEEK TAKE WITH 8 OUNCES OF WATER AND ON EMPTY STOMACH AS DIRECTED  12 tablet  3  . aspirin 81 MG tablet Take 81 mg by mouth  daily.        Marland Kitchen diltiazem (CARDIZEM CD) 120 MG 24 hr capsule TAKE ONE CAPSULE BY MOUTH EVERY DAY  90 capsule  3  . finasteride (PROSCAR) 5 MG tablet TAKE 1 TABLET BY MOUTH EVERY DAY  90 tablet  3  . fluticasone (FLONASE) 50 MCG/ACT nasal spray Place 2 sprays into the nose daily.  16 g  6  . folic acid (FOLVITE) 400 MCG tablet Take 400 mcg by mouth daily.        Marland Kitchen leflunomide (ARAVA) 20 MG tablet Take 1 tablet by mouth daily.      Marland Kitchen lisinopril (PRINIVIL,ZESTRIL) 20 MG tablet TAKE 1 TABLET EVERY DAY BY MOUTH AS DIRECTED  90 tablet  4  . methotrexate (RHEUMATREX) 2.5 MG tablet Take 2.5 mg by mouth daily. Caution:Chemotherapy.      . metoprolol succinate (TOPROL-XL) 100 MG 24 hr tablet TAKE 1 TABLET BY MOUTH EVERY DAY  90 tablet  1  . simvastatin (ZOCOR) 40 MG tablet TAKE 1 TABLET BY MOUTH AT BEDTIME  90 tablet  4  . warfarin (COUMADIN) 5 MG tablet 5 mg Tuesdays and Thursdays, 2.5 mg all others        REVIEW OF SYSTEMS: Arly.Keller ] denotes positive finding; [  ] denotes negative finding  CARDIOVASCULAR:  [ ]  chest pain   [ ]  chest pressure   [ ]  palpitations   [ ]   orthopnea   [ ]  dyspnea on exertion   [ ]  claudication   [ ]  rest pain   [ ]  DVT   [ ]  phlebitis PULMONARY:   [ ]  productive cough   [ ]  asthma   [ ]  wheezing NEUROLOGIC:   [ ]  weakness  [ ]  paresthesias  [ ]  aphasia  [ ]  amaurosis  [ ]  dizziness HEMATOLOGIC:   [ ]  bleeding problems   [ ]  clotting disorders MUSCULOSKELETAL:  [ ]  joint pain   [ ]  joint swelling [ ]  leg swelling GASTROINTESTINAL: [ ]   blood in stool  [ ]   hematemesis GENITOURINARY:  [ ]   dysuria  [ ]   hematuria PSYCHIATRIC:  [ ]  history of major depression INTEGUMENTARY:  [ ]  rashes  [ ]  ulcers CONSTITUTIONAL:  [ ]  fever   [ ]  chills  PHYSICAL EXAM: Filed Vitals:   03/24/12 1129  BP: 118/54  Pulse: 69  Height: 5\' 10"  (1.778 m)  Weight: 148 lb 4.8 oz (67.268 kg)  SpO2: 100%   Body mass index is 21.28 kg/(m^2). GENERAL: The patient is a well-nourished male, in no acute  distress. The vital signs are documented above. CARDIOVASCULAR: There is a regular rate and rhythm. He has a palpable right femoral pulse with a diminished left femoral pulse. Both feet appear adequately perfused. I cannot palpate pedal pulses. PULMONARY: There is good air exchange bilaterally without wheezing or rales. ABDOMEN: Soft and non-tender with normal pitched bowel sounds.  MUSCULOSKELETAL: There are no major deformities or cyanosis. NEUROLOGIC: No focal weakness or paresthesias are detected. SKIN: There are no ulcers or rashes noted. PSYCHIATRIC: The patient has a normal affect.  DATA:  I have independently interpreted his duplex scan today which shows that his graft is patent with no areas of increased callosity within the graft.  He does have some slightly increased velocities proximal to his graft in the right groin. ABI on the right is 33% which is actually improved slightly compared to April and it was 25%. This is secondary to his severe tibial artery occlusive disease.  MEDICAL ISSUES:  Atherosclerosis of native arteries of the extremities with intermittent claudication His right femoropopliteal bypass graft is patent with stable ABIs bilaterally. He has severe tibial artery occlusive disease with single vessel runoff via the peroneal artery on the right. Fortunately he quit smoking in 2006. I've encouraged him to remain as active as possible. He'll be due for follow up study in 6 months and will see him back at that time. He knows to call sooner if he has problems.  CAROTID ARTERY STENOSIS, WITHOUT INFARCTION He has a right carotid bruit. His carotid duplex scans are done at the [facility] office and his most recent study was in August of 2011. He had bilateral 40-59% stenoses. He states that he are has arrangements for follow duplex scan. He knows to continue taking his aspirin.   DICKSON,[name] S [facility] [place] Beeper: [phone]

## 2012-03-24 NOTE — Addendum Note (Signed)
Addended by: Melodye Ped C on: 03/24/2012 04:11 PM   Modules accepted: Orders

## 2012-03-24 NOTE — Assessment & Plan Note (Signed)
He has a right carotid bruit. His carotid duplex scans are done at the [facility] office and his most recent study was in August of 2011. He had bilateral 40-59% stenoses. He states that he are has arrangements for follow duplex scan. He knows to continue taking his aspirin.

## 2012-03-24 NOTE — Assessment & Plan Note (Signed)
His right femoropopliteal bypass graft is patent with stable ABIs bilaterally. He has severe tibial artery occlusive disease with single vessel runoff via the peroneal artery on the right. Fortunately he quit smoking in 2006. I've encouraged him to remain as active as possible. He'll be due for follow up study in 6 months and will see him back at that time. He knows to call sooner if he has problems.

## 2012-04-09 ENCOUNTER — Ambulatory Visit (INDEPENDENT_AMBULATORY_CARE_PROVIDER_SITE_OTHER): Payer: Medicare Other | Admitting: Cardiovascular Disease

## 2012-04-09 ENCOUNTER — Ambulatory Visit (INDEPENDENT_AMBULATORY_CARE_PROVIDER_SITE_OTHER): Payer: Medicare Other

## 2012-04-09 ENCOUNTER — Encounter: Payer: Self-pay | Admitting: Cardiovascular Disease

## 2012-04-09 VITALS — BP 112/60 | HR 66 | Ht 68.0 in | Wt 145.0 lb

## 2012-04-09 DIAGNOSIS — Z8679 Personal history of other diseases of the circulatory system: Secondary | ICD-10-CM

## 2012-04-09 DIAGNOSIS — I4891 Unspecified atrial fibrillation: Secondary | ICD-10-CM

## 2012-04-09 DIAGNOSIS — I2510 Atherosclerotic heart disease of native coronary artery without angina pectoris: Secondary | ICD-10-CM

## 2012-04-09 DIAGNOSIS — I6529 Occlusion and stenosis of unspecified carotid artery: Secondary | ICD-10-CM

## 2012-04-09 NOTE — Progress Notes (Signed)
HPI:   78 year old gentleman presenting for followup evaluation. The patient has coronary artery disease status post CABG. He also has peripheral arterial disease and has undergone lower extremity bypass surgery. He's been followed for hypertension and paroxysmal atrial fibrillation.  The patient is followed regularly at the cancer Center because of history of squamous cell lung cancer status post wedge resection and prostate cancer status post external beam radiation.  Lipids earlier this month showed a cholesterol of 135, triglycerides 69, HDL 42, and LDL 79. Liver function tests were within normal limits.  Overall the patient feels fairly well. He has generalized fatigue and complains of pain in his hands and wrists from arthritis. He denies chest pain, chest pressure, or shortness of breath. He's had no recent palpitations, lightheadedness, or syncope.  Outpatient Encounter Prescriptions as of 04/09/2012  Medication Sig Dispense Refill  . acetaminophen (TYLENOL) 325 MG tablet Take 650 mg by mouth every 6 (six) hours as needed.        Marland Kitchen alendronate (FOSAMAX) 70 MG tablet TAKE 1 TABLET BY MOUTH EVERY WEEK TAKE WITH 8 OUNCES OF WATER AND ON EMPTY STOMACH AS DIRECTED  12 tablet  3  . aspirin 81 MG tablet Take 81 mg by mouth daily.        Marland Kitchen diltiazem (CARDIZEM CD) 120 MG 24 hr capsule TAKE ONE CAPSULE BY MOUTH EVERY DAY  90 capsule  3  . finasteride (PROSCAR) 5 MG tablet TAKE 1 TABLET BY MOUTH EVERY DAY  90 tablet  3  . fluticasone (FLONASE) 50 MCG/ACT nasal spray Place 2 sprays into the nose daily.  16 g  6  . folic acid (FOLVITE) 400 MCG tablet Take 400 mcg by mouth daily.        Marland Kitchen leflunomide (ARAVA) 20 MG tablet Take 1 tablet by mouth daily.      Marland Kitchen lisinopril (PRINIVIL,ZESTRIL) 20 MG tablet TAKE 1 TABLET EVERY DAY BY MOUTH AS DIRECTED  90 tablet  4  . methotrexate (RHEUMATREX) 2.5 MG tablet Take 2.5 mg by mouth daily. Caution:Chemotherapy.      . metoprolol succinate (TOPROL-XL) 100 MG 24  hr tablet TAKE 1 TABLET BY MOUTH EVERY DAY  90 tablet  1  . simvastatin (ZOCOR) 40 MG tablet TAKE 1 TABLET BY MOUTH AT BEDTIME  90 tablet  4  . warfarin (COUMADIN) 5 MG tablet 5 mg Tuesdays and Thursdays, 2.5 mg all others        No Known Allergies  Past Medical History  Diagnosis Date  . CARCINOMA, LUNG, SQUAMOUS CELL 08/21/2008  . HYPERLIPIDEMIA 11/13/2006  . ANEMIA 09/07/2009  . MYOCARDIAL INFARCTION, HX OF 11/13/2006  . CORONARY ARTERY DISEASE 11/13/2006  . Atrial fibrillation 11/13/2006  . CONGESTIVE HEART FAILURE 02/01/2007  . CAROTID ARTERY STENOSIS, WITHOUT INFARCTION 09/07/2008  . KIDNEY DISEASE, CHRONIC, STAGE II 10/13/2007  . Rheumatoid arthritis 11/13/2006  . OSTEOPENIA 11/13/2006  . Cancer of prostate 10/24/2009  . TRANSIENT ISCHEMIC ATTACK, HX OF 11/13/2006  . COLONIC POLYPS, HX OF 07/04/2003    6 mm adenoma  . Femoral artery occlusion     right  . PAD (peripheral artery disease)     ROS: Negative except as per HPI  BP 112/60  Pulse 66  Ht 5\' 8"  (1.727 m)  Wt 65.772 kg (145 lb)  BMI 22.05 kg/m2  PHYSICAL EXAM: Pt is alert and oriented, NAD HEENT: normal Neck: JVP - normal, carotids 2+= with a right carotid bruit Lungs: CTA bilaterally CV: RRR without murmur  or gallop Abd: soft, NT, Positive BS, no hepatomegaly Ext: no C/C/E Skin: warm/dry no rash  EKG:  Normal sinus rhythm 66 beats per minute, cannot rule out inferior infarct age undetermined.  ASSESSMENT AND PLAN: 1. CAD status post CABG. The patient is stable without anginal symptoms. His EKG is unchanged. He will continue on his current medical program.  2. Paroxysmal atrial fibrillation. He is maintaining sinus rhythm. The patient is anticoagulated with warfarin. I like to see him back in one year for followup.  3. Hypertension. Blood pressure is well controlled on a combination of diltiazem, lisinopril, and metoprolol.  4. Hyperlipidemia. The patient's lipids have been followed by Dr. Cato Mulligan. His recent lipid panel  was reviewed as above.  5. Asymptomatic carotid stenosis. His carotid duplex scan from August 2012 showed 40-59% bilateral ICA stenosis stable over serial exams. 2 year followup was recommended, so we will arrange a followup ultrasound before his office visit in one year.  6. Lower extremity peripheral arterial disease. Followed by Dr. [name].  For followup, I would like to see the patient back in one year after his carotid study.  Tonny Bollman 04/09/2012 11:05 AM

## 2012-04-09 NOTE — Patient Instructions (Addendum)
Your physician has requested that you have a carotid duplex in November 2014. This test is an ultrasound of the carotid arteries in your neck. It looks at blood flow through these arteries that supply the brain with blood. Allow one hour for this exam. There are no restrictions or special instructions.  Your physician wants you to follow-up in: November 2014. You will receive a reminder letter in the mail two months in advance. If you don't receive a letter, please call our office to schedule the follow-up appointment.  Your physician recommends that you continue on your current medications as directed. Please refer to the Current Medication list given to you today.

## 2012-04-19 ENCOUNTER — Ambulatory Visit (HOSPITAL_BASED_OUTPATIENT_CLINIC_OR_DEPARTMENT_OTHER): Payer: Medicare Other

## 2012-04-19 ENCOUNTER — Other Ambulatory Visit (HOSPITAL_BASED_OUTPATIENT_CLINIC_OR_DEPARTMENT_OTHER): Payer: Medicare Other | Admitting: Lab

## 2012-04-19 VITALS — BP 97/54 | HR 67 | Temp 97.0°F

## 2012-04-19 DIAGNOSIS — D649 Anemia, unspecified: Secondary | ICD-10-CM

## 2012-04-19 LAB — CBC WITH DIFFERENTIAL/PLATELET
Basophils Absolute: 0.1 10*3/uL (ref 0.0–0.1)
Eosinophils Absolute: 0.3 10*3/uL (ref 0.0–0.5)
HCT: 29.1 % — ABNORMAL LOW (ref 38.4–49.9)
HGB: 9.3 g/dL — ABNORMAL LOW (ref 13.0–17.1)
LYMPH%: 16.6 % (ref 14.0–49.0)
MCV: 88.2 fL (ref 79.3–98.0)
MONO%: 16.3 % — ABNORMAL HIGH (ref 0.0–14.0)
NEUT#: 3.8 10*3/uL (ref 1.5–6.5)
Platelets: 389 10*3/uL (ref 140–400)

## 2012-04-19 MED ORDER — DARBEPOETIN ALFA-POLYSORBATE 300 MCG/0.6ML IJ SOLN
300.0000 ug | INTRAMUSCULAR | Status: DC
  Administered 2012-04-19: 300 ug via SUBCUTANEOUS
  Filled 2012-04-19: qty 0.6

## 2012-05-05 ENCOUNTER — Ambulatory Visit (INDEPENDENT_AMBULATORY_CARE_PROVIDER_SITE_OTHER): Payer: Medicare Other | Admitting: *Deleted

## 2012-05-05 DIAGNOSIS — I4891 Unspecified atrial fibrillation: Secondary | ICD-10-CM

## 2012-05-05 DIAGNOSIS — Z8679 Personal history of other diseases of the circulatory system: Secondary | ICD-10-CM

## 2012-05-14 ENCOUNTER — Ambulatory Visit (INDEPENDENT_AMBULATORY_CARE_PROVIDER_SITE_OTHER): Payer: Medicare Other | Admitting: *Deleted

## 2012-05-14 DIAGNOSIS — I4891 Unspecified atrial fibrillation: Secondary | ICD-10-CM

## 2012-05-14 DIAGNOSIS — Z8679 Personal history of other diseases of the circulatory system: Secondary | ICD-10-CM

## 2012-05-17 ENCOUNTER — Ambulatory Visit: Payer: Medicare Other

## 2012-05-17 ENCOUNTER — Other Ambulatory Visit (HOSPITAL_BASED_OUTPATIENT_CLINIC_OR_DEPARTMENT_OTHER): Payer: Medicare Other | Admitting: Lab

## 2012-05-17 DIAGNOSIS — D649 Anemia, unspecified: Secondary | ICD-10-CM

## 2012-05-17 LAB — CBC WITH DIFFERENTIAL/PLATELET
Basophils Absolute: 0 10*3/uL (ref 0.0–0.1)
Eosinophils Absolute: 0.2 10*3/uL (ref 0.0–0.5)
HGB: 11 g/dL — ABNORMAL LOW (ref 13.0–17.1)
MCV: 93.3 fL (ref 79.3–98.0)
MONO#: 0.6 10*3/uL (ref 0.1–0.9)
MONO%: 14 % (ref 0.0–14.0)
NEUT#: 2.8 10*3/uL (ref 1.5–6.5)
Platelets: 259 10*3/uL (ref 140–400)
RDW: 17.8 % — ABNORMAL HIGH (ref 11.0–14.6)
WBC: 4.4 10*3/uL (ref 4.0–10.3)

## 2012-05-17 MED ORDER — DARBEPOETIN ALFA-POLYSORBATE 300 MCG/0.6ML IJ SOLN: 300.0000 ug | INTRAMUSCULAR | Status: DC

## 2012-05-18 ENCOUNTER — Ambulatory Visit: Payer: Medicare Other | Admitting: Cardiovascular Disease

## 2012-05-28 ENCOUNTER — Ambulatory Visit (INDEPENDENT_AMBULATORY_CARE_PROVIDER_SITE_OTHER): Payer: Medicare Other | Admitting: *Deleted

## 2012-05-28 DIAGNOSIS — I4891 Unspecified atrial fibrillation: Secondary | ICD-10-CM

## 2012-05-28 DIAGNOSIS — Z8679 Personal history of other diseases of the circulatory system: Secondary | ICD-10-CM

## 2012-05-28 MED ORDER — WARFARIN SODIUM 5 MG PO TABS: 5.0000 mg | ORAL_TABLET | ORAL | Status: DC

## 2012-06-11 ENCOUNTER — Other Ambulatory Visit (HOSPITAL_BASED_OUTPATIENT_CLINIC_OR_DEPARTMENT_OTHER): Payer: Medicare Other | Admitting: Lab

## 2012-06-11 ENCOUNTER — Ambulatory Visit (HOSPITAL_COMMUNITY)
Admission: RE | Admit: 2012-06-11 | Discharge: 2012-06-11 | Disposition: A | Discharge status: Home / Self Care | Payer: Medicare Other | Source: Ambulatory Visit | Attending: Oncology | Admitting: Oncology

## 2012-06-11 DIAGNOSIS — C3490 Malignant neoplasm of unspecified part of unspecified bronchus or lung: Secondary | ICD-10-CM

## 2012-06-11 DIAGNOSIS — C61 Malignant neoplasm of prostate: Secondary | ICD-10-CM | POA: Insufficient documentation

## 2012-06-11 DIAGNOSIS — D649 Anemia, unspecified: Secondary | ICD-10-CM

## 2012-06-11 DIAGNOSIS — I2510 Atherosclerotic heart disease of native coronary artery without angina pectoris: Secondary | ICD-10-CM | POA: Insufficient documentation

## 2012-06-11 DIAGNOSIS — N3289 Other specified disorders of bladder: Secondary | ICD-10-CM | POA: Insufficient documentation

## 2012-06-11 LAB — CBC WITH DIFFERENTIAL/PLATELET
Basophils Absolute: 0.2 10*3/uL — ABNORMAL HIGH (ref 0.0–0.1)
EOS%: 6.7 % (ref 0.0–7.0)
Eosinophils Absolute: 0.3 10*3/uL (ref 0.0–0.5)
HCT: 33.5 % — ABNORMAL LOW (ref 38.4–49.9)
HGB: 11 g/dL — ABNORMAL LOW (ref 13.0–17.1)
LYMPH%: 20.2 % (ref 14.0–49.0)
MCH: 30 pg (ref 27.2–33.4)
MCV: 91.3 fL (ref 79.3–98.0)
MONO%: 16.7 % — ABNORMAL HIGH (ref 0.0–14.0)
NEUT%: 53.2 % (ref 39.0–75.0)
Platelets: 308 10*3/uL (ref 140–400)

## 2012-06-11 LAB — COMPREHENSIVE METABOLIC PANEL (CC13)
AST: 21 U/L (ref 5–34)
Alkaline Phosphatase: 108 U/L (ref 40–150)
BUN: 23 mg/dL (ref 7.0–26.0)
Creatinine: 1.2 mg/dL (ref 0.7–1.3)
Glucose: 94 mg/dl (ref 70–99)

## 2012-06-11 MED ORDER — IOHEXOL 300 MG/ML  SOLN
100.0000 mL | Freq: Once | INTRAMUSCULAR | Status: AC | PRN
  Administered 2012-06-11: 100 mL via INTRAVENOUS

## 2012-06-15 ENCOUNTER — Other Ambulatory Visit: Payer: Medicare Other | Admitting: Lab

## 2012-06-15 ENCOUNTER — Ambulatory Visit: Payer: Medicare Other

## 2012-06-15 ENCOUNTER — Ambulatory Visit (HOSPITAL_BASED_OUTPATIENT_CLINIC_OR_DEPARTMENT_OTHER): Payer: Medicare Other | Admitting: Oncology

## 2012-06-15 ENCOUNTER — Other Ambulatory Visit (HOSPITAL_BASED_OUTPATIENT_CLINIC_OR_DEPARTMENT_OTHER): Payer: Medicare Other

## 2012-06-15 ENCOUNTER — Telehealth: Payer: Self-pay | Admitting: Oncology

## 2012-06-15 VITALS — BP 95/60 | HR 81 | Temp 96.8°F | Resp 20 | Ht 68.0 in | Wt 142.2 lb

## 2012-06-15 DIAGNOSIS — Z8546 Personal history of malignant neoplasm of prostate: Secondary | ICD-10-CM

## 2012-06-15 DIAGNOSIS — Z85118 Personal history of other malignant neoplasm of bronchus and lung: Secondary | ICD-10-CM

## 2012-06-15 DIAGNOSIS — D649 Anemia, unspecified: Secondary | ICD-10-CM

## 2012-06-15 DIAGNOSIS — C3490 Malignant neoplasm of unspecified part of unspecified bronchus or lung: Secondary | ICD-10-CM

## 2012-06-15 LAB — CBC WITH DIFFERENTIAL/PLATELET
Basophils Absolute: 0.2 10*3/uL — ABNORMAL HIGH (ref 0.0–0.1)
EOS%: 8 % — ABNORMAL HIGH (ref 0.0–7.0)
Eosinophils Absolute: 0.4 10*3/uL (ref 0.0–0.5)
HCT: 34.3 % — ABNORMAL LOW (ref 38.4–49.9)
HGB: 11 g/dL — ABNORMAL LOW (ref 13.0–17.1)
MCH: 29.1 pg (ref 27.2–33.4)
MCV: 90.4 fL (ref 79.3–98.0)
MONO%: 14.7 % — ABNORMAL HIGH (ref 0.0–14.0)
NEUT#: 2.7 10*3/uL (ref 1.5–6.5)
NEUT%: 52.1 % (ref 39.0–75.0)
RDW: 16.6 % — ABNORMAL HIGH (ref 11.0–14.6)
lymph#: 1.1 10*3/uL (ref 0.9–3.3)

## 2012-06-15 NOTE — Progress Notes (Signed)
Hematology and Oncology Follow Up Visit  [name] [id number] 15-Apr-1932 79 y.o. 06/15/2012 12:12 PM  CC: Bruce H. Cato Mulligan, MD  Ines Bloomer, M.D.  Di Kindle. [name], M.D.    Principle Diagnosis: This is a 79 year old gentleman with the following diagnoses: 1. 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 2. Multifactorial anemia probably due to chronic disease and rheumatoid arthritis. 3. History of a stage T1c Gleason score 8 adenocarcinoma of the prostate S/P external beam radiation under the care of Dr. Roselind Messier completed in 04/2011.  Current therapy:  1. He is on Aranesp at 300 mcg every 4 weeks to keep his hemoglobin above 10. 2. Watchful observation for his lung and prostate cancer.  Interim History:  This is a pleasant gentleman with the above history.  He presents today for a follow-up visit.  Since the last time I saw him, he had not reported any recent illnesses or hospitalization, had not had any weakness, fatigue.  He does report some occasional tiredness.  He has not reported any chest pain, has not reported any difficulty breathing, has not reported any cough or deterioration in his health. He continues to be on methotrexate on weekly bases for his rheumatoid arthritis.  No respiratory symptoms noted. No hemoptysis, hematemesis, hematuria, or melena.  No new illnesses or hospitalizations.   Medications: I have reviewed the patient's current medications. Current outpatient prescriptions:acetaminophen (TYLENOL) 325 MG tablet, Take 650 mg by mouth every 6 (six) hours as needed.  , Disp: , Rfl: ;  alendronate (FOSAMAX) 70 MG tablet, TAKE 1 TABLET BY MOUTH EVERY WEEK TAKE WITH 8 OUNCES OF WATER AND ON EMPTY STOMACH AS DIRECTED, Disp: 12 tablet, Rfl: 3;  aspirin 81 MG tablet, Take 81 mg by mouth daily.  , Disp: , Rfl:  diltiazem (CARDIZEM CD) 120 MG 24 hr capsule, TAKE ONE CAPSULE BY MOUTH EVERY DAY, Disp: 90  capsule, Rfl: 3;  finasteride (PROSCAR) 5 MG tablet, TAKE 1 TABLET BY MOUTH EVERY DAY, Disp: 90 tablet, Rfl: 3;  fluticasone (FLONASE) 50 MCG/ACT nasal spray, Place 2 sprays into the nose daily., Disp: 16 g, Rfl: 6;  folic acid (FOLVITE) 400 MCG tablet, Take 400 mcg by mouth daily.  , Disp: , Rfl:  leflunomide (ARAVA) 20 MG tablet, Take 1 tablet by mouth daily., Disp: , Rfl: ;  lisinopril (PRINIVIL,ZESTRIL) 20 MG tablet, TAKE 1 TABLET EVERY DAY BY MOUTH AS DIRECTED, Disp: 90 tablet, Rfl: 4;  methotrexate (RHEUMATREX) 2.5 MG tablet, Take 2.5 mg by mouth daily. Caution:Chemotherapy., Disp: , Rfl: ;  metoprolol succinate (TOPROL-XL) 100 MG 24 hr tablet, TAKE 1 TABLET BY MOUTH EVERY DAY, Disp: 90 tablet, Rfl: 1 simvastatin (ZOCOR) 40 MG tablet, TAKE 1 TABLET BY MOUTH AT BEDTIME, Disp: 90 tablet, Rfl: 4;  warfarin (COUMADIN) 5 MG tablet, Take 1 tablet (5 mg total) by mouth as directed., Disp: 30 tablet, Rfl: 3  Allergies: No Known Allergies  Past Medical History, Surgical history, Social history, and Family History were reviewed and updated.  Review of Systems: Constitutional:  Negative for fever, chills, night sweats, anorexia, weight loss, pain. Cardiovascular: no chest pain or dyspnea on exertion Respiratory: no cough, shortness of breath, or wheezing Neurological: no TIA or stroke symptoms Dermatological: negative ENT: negative Skin: Negative. Gastrointestinal: no abdominal pain, change in bowel habits, or black or bloody stools Genito-Urinary: no dysuria, trouble voiding, or hematuria Hematological and Lymphatic: negative Breast: negative Musculoskeletal: negative Remaining  ROS negative.  Physical Exam: Blood pressure 95/60, pulse 81, temperature 96.8 F (36 C), temperature source Oral, resp. rate 20, height 5\' 8"  (1.727 m), weight 142 lb 3.2 oz (64.501 kg). ECOG: 1 General appearance: alert Head: Normocephalic, without obvious abnormality, atraumatic Neck: no adenopathy, no carotid  bruit, no JVD, supple, symmetrical, trachea midline and thyroid not enlarged, symmetric, no tenderness/mass/nodules Lymph nodes: Cervical, supraclavicular, and axillary nodes normal. Heart:regular rate and rhythm, S1, S2 normal, no murmur, click, rub or gallop Lung:chest clear, no wheezing, rales, normal symmetric air entry Abdomen: soft, non-tender, without masses or organomegaly EXT:no erythema, induration, or nodules  Lab Results: Lab Results  Component Value Date   WBC 5.0 06/11/2012   HGB 11.0* 06/11/2012   HCT 33.5* 06/11/2012   MCV 91.3 06/11/2012   PLT 308 06/11/2012     Chemistry      Component Value Date/Time   NA 144 06/11/2012 1102   NA 141 10/22/2011 0944   NA 138 07/03/2011 1323   K 4.1 06/11/2012 1102   K 4.8* 10/22/2011 0944   K 4.5 07/03/2011 1323   CL 111* 06/11/2012 1102   CL 98 10/22/2011 0944   CL 104 07/03/2011 1323   CO2 26 06/11/2012 1102   CO2 26 10/22/2011 0944   CO2 25 07/03/2011 1323   BUN 23.0 06/11/2012 1102   BUN 29* 10/22/2011 0944   BUN 22 07/03/2011 1323   CREATININE 1.2 06/11/2012 1102   CREATININE 1.5* 10/22/2011 0944   CREATININE 1.19 07/03/2011 1323      Component Value Date/Time   CALCIUM 9.5 06/11/2012 1102   CALCIUM 8.8 10/22/2011 0944   CALCIUM 9.1 07/03/2011 1323   CALCIUM 9.4 10/14/2007 0110   ALKPHOS 108 06/11/2012 1102   ALKPHOS 76 10/22/2011 0944   ALKPHOS 68 07/03/2011 1323   AST 21 06/11/2012 1102   AST 23 10/22/2011 0944   AST 19 07/03/2011 1323   ALT 14 06/11/2012 1102   ALT 14 07/03/2011 1323   BILITOT 0.70 06/11/2012 1102   BILITOT 1.10 10/22/2011 0944   BILITOT 0.8 07/03/2011 1323     CT CHEST, ABDOMEN AND PELVIS WITH CONTRAST  Technique: Multidetector CT imaging of the chest, abdomen and  pelvis was performed following the standard protocol during bolus  administration of intravenous contrast.  Contrast: OMNIPAQUE IOHEXOL 300 MG/ML SOLN  Comparison: Multiple exams, including 10/22/2011  CT CHEST  Findings: Precarinal node 1 cm in short axis, with  borderline  prominent lymph nodes in this vicinity similar to prior. Small  bilateral hilar lymph nodes are present there is evidence of  atherosclerosis and prior CABG. Mild right heart enlargement is  observed with flattening of the interventricular septum. Aortic  atherosclerosis is present.  Emphysema noted with biapical pleuroparenchymal scarring and patchy  peripheral regions of fibrosis and mild honeycombing particularly  in the right lung. This appears generally stable although there  are some slightly increased nodular components along the  interstitial accentuation and fibrosis in the right lung, for  example on image 31 of series 6 laterally and on images 40-41 of  series 6 which will merit continued observation. Small subpleural  lymph nodes are present along the left major fissure.  IMPRESSION:  1. Emphysema with slightly progressive peripheral fibrosis and  honeycombing. In several areas this has a slight nodular component  which will merit observation.  2. Atherosclerosis.  3. Right heart enlargement with flattening of the interventricular  septum.  4. Borderline lower paratracheal adenopathy, similar  to prior.  CT ABDOMEN AND PELVIS  Findings: Stable elevated right hemidiaphragm. The liver, spleen,  pancreas, and adrenal glands appear unremarkable.  Right kidney upper pole cyst noted with stable mild bilateral renal  scarring. Aortoiliac atherosclerotic calcification is present.  No pathologic retroperitoneal or porta hepatis adenopathy is  identified.  No pathologic pelvic adenopathy is identified.  Small caliber thick-walled urinary bladder noted. Radiotherapy  seeds noted along the prostate bed. Wall thickening noted in the  rectum.  No pathologic pelvic adenopathy is identified.  IMPRESSION:  1. Wall thickening in the small caliber urinary bladder and in the  rectum, possibly related to prior radiation therapy.  2. Atherosclerosis.  3. No findings of  metastatic disease in the abdomen or pelvis.   Impression and Plan: .  A 79 year old gentleman with the following issues: 1. Stage IB lung cancer status post surgical resection.  Ct scan discussed today and continues to have no evidence of recurrent disease.  Continue on active surveillance. He is due for a restaging CT scan in about 12  Months. 2. Anemia, multifactorial.  Continue to receive Aranesp to keep his hemoglobin above 10.  Aranesp will be 300 mcg every 4 weeks. Aranesp injection was held today due to Hgb of 11.0. 3. T1c Gleason score 8 adenocarcinoma of the prostate with a PSA of 14.2.  He is S/P  radiation therapy without evidence of relapse. PSA is followed by Urology. 4. Follow-up. In 6 months.  [facility]                  1/7/201412:12 PM

## 2012-06-15 NOTE — Telephone Encounter (Signed)
Gave patient appt for lab and injections every month and see MD on July 2014 with injection and labs

## 2012-06-21 ENCOUNTER — Other Ambulatory Visit: Payer: Self-pay | Admitting: Internal Medicine

## 2012-06-25 ENCOUNTER — Ambulatory Visit (INDEPENDENT_AMBULATORY_CARE_PROVIDER_SITE_OTHER): Payer: Medicare Other | Admitting: *Deleted

## 2012-06-25 DIAGNOSIS — I4891 Unspecified atrial fibrillation: Secondary | ICD-10-CM

## 2012-06-25 DIAGNOSIS — Z8679 Personal history of other diseases of the circulatory system: Secondary | ICD-10-CM

## 2012-06-25 LAB — POCT INR: INR: 1.7

## 2012-07-13 ENCOUNTER — Telehealth: Payer: Self-pay

## 2012-07-13 DIAGNOSIS — L98499 Non-pressure chronic ulcer of skin of other sites with unspecified severity: Secondary | ICD-10-CM

## 2012-07-13 NOTE — Telephone Encounter (Signed)
Pt. Called to report pain in left foot and a sore on left great toe.  Stated this started approx. 2 wks. Ago, and has worsened.  Report swelling in left foot to ankle, and a thick yellow dnge. from sore (L) great toe.  Denies fever / chills.  States pain is at rest.  Appt. Given for 07/14/12 @ 10:00 AM.  Agrees w/ plan.

## 2012-07-14 ENCOUNTER — Telehealth: Payer: Self-pay | Admitting: *Deleted

## 2012-07-14 ENCOUNTER — Encounter: Payer: Self-pay | Admitting: Neurosurgery

## 2012-07-14 ENCOUNTER — Ambulatory Visit (INDEPENDENT_AMBULATORY_CARE_PROVIDER_SITE_OTHER): Payer: Medicare Other | Admitting: Neurosurgery

## 2012-07-14 ENCOUNTER — Encounter (HOSPITAL_COMMUNITY): Payer: Self-pay | Admitting: Pharmacy Technician

## 2012-07-14 ENCOUNTER — Other Ambulatory Visit: Payer: Self-pay

## 2012-07-14 ENCOUNTER — Other Ambulatory Visit: Payer: Self-pay | Admitting: Internal Medicine

## 2012-07-14 ENCOUNTER — Telehealth: Payer: Self-pay | Admitting: Cardiovascular Disease

## 2012-07-14 VITALS — BP 98/65 | HR 84 | Temp 97.7°F | Resp 14 | Ht 69.0 in | Wt 137.2 lb

## 2012-07-14 DIAGNOSIS — I70219 Atherosclerosis of native arteries of extremities with intermittent claudication, unspecified extremity: Secondary | ICD-10-CM

## 2012-07-14 DIAGNOSIS — L98499 Non-pressure chronic ulcer of skin of other sites with unspecified severity: Secondary | ICD-10-CM

## 2012-07-14 DIAGNOSIS — M79675 Pain in left toe(s): Secondary | ICD-10-CM

## 2012-07-14 DIAGNOSIS — I739 Peripheral vascular disease, unspecified: Secondary | ICD-10-CM

## 2012-07-14 DIAGNOSIS — M79609 Pain in unspecified limb: Secondary | ICD-10-CM

## 2012-07-14 DIAGNOSIS — M7989 Other specified soft tissue disorders: Secondary | ICD-10-CM

## 2012-07-14 MED ORDER — CIPROFLOXACIN HCL 500 MG PO TABS: 500.0000 mg | ORAL_TABLET | Freq: Two times a day (BID) | ORAL | Status: DC

## 2012-07-14 NOTE — Telephone Encounter (Signed)
Left message to call back  

## 2012-07-14 NOTE — Telephone Encounter (Signed)
Pt is for procedure 07/19/12, Dr Excell Seltzer states,  via [facility]ting,  he needs lovenox bridging, I have called and instructed pt to not take any coumadin today,  he will need lovenox bridging starting tomorrow, I changed his appt to tomorrow am 8:30am, he will bring a family member with him, explanation of why he needs lovenox injections.

## 2012-07-14 NOTE — Telephone Encounter (Signed)
New Problem  Okey Regal called in wanting to speak to you. She states this pt is on coumadin and needs to stop today due to an angiogram being done on lower leg on Monday 2/10. She wanted to speak to you about it. Please call back.

## 2012-07-14 NOTE — Progress Notes (Signed)
[facility] [place] [facility] Office Note  CC: Infected left foot/painful Referring Physician: Dickson  History of Present Illness: 79-year-old male patient of Dr. Dickson status post right femoropopliteal bypass in 2010. The patient called the office asking to be seen due to to a painful open wound on his left foot lateral to the great toe. The patient states this has been opening for 2 weeks with yellow drainage. The patient noticed increasing edema yesterday.  Past Medical History  Diagnosis Date  . CARCINOMA, LUNG, SQUAMOUS CELL 08/21/2008  . HYPERLIPIDEMIA 11/13/2006  . ANEMIA 09/07/2009  . MYOCARDIAL INFARCTION, HX OF 11/13/2006  . CORONARY ARTERY DISEASE 11/13/2006  . Atrial fibrillation 11/13/2006  . CONGESTIVE HEART FAILURE 02/01/2007  . CAROTID ARTERY STENOSIS, WITHOUT INFARCTION 09/07/2008  . KIDNEY DISEASE, CHRONIC, STAGE II 10/13/2007  . Rheumatoid arthritis 11/13/2006  . OSTEOPENIA 11/13/2006  . Cancer of prostate 10/24/2009  . TRANSIENT ISCHEMIC ATTACK, HX OF 11/13/2006  . COLONIC POLYPS, HX OF 07/04/2003    6 mm adenoma  . Femoral artery occlusion     right  . PAD (peripheral artery disease)   . Stroke 2008    ROS: [x] Positive   [ ] Denies    General: [ ] Weight loss, [ ] Fever, [ ] chills Neurologic: [ ] Dizziness, [ ] Blackouts, [ ] Seizure [ ] Stroke, [ ] "Mini stroke", [ ] Slurred speech, [ ] Temporary blindness; [ ] weakness in arms or legs, [ ] Hoarseness Cardiac: [ ] Chest pain/pressure, [ ] Shortness of breath at rest [ ] Shortness of breath with exertion, [ ] Atrial fibrillation or irregular heartbeat Vascular: [ ] Pain in legs with walking, [ ] Pain in legs at rest, [ ] Pain in legs at night,  [ ] Non-healing ulcer, [ ] Blood clot in vein/DVT,   Pulmonary: [ ] Home oxygen, [ ] Productive cough, [ ] Coughing up blood, [ ] Asthma,  [ ] Wheezing Musculoskeletal:  [ ] Arthritis, [ ] Low back pain, [ ] Joint pain Hematologic: [ ] Easy Bruising, [ ] Anemia;  [ ] Hepatitis Gastrointestinal: [ ] Blood in stool, [ ] Gastroesophageal Reflux/heartburn, [ ] Trouble swallowing Urinary: [ ] chronic Kidney disease, [ ] on HD - [ ] MWF or [ ] TTHS, [ ] Burning with urination, [ ] Difficulty urinating Skin: [ ] Rashes, [ ] Wounds Psychological: [ ] Anxiety, [ ] Depression   Social History History  Substance Use Topics  . Smoking status: Former Smoker    Quit date: 06/09/2001  . Smokeless tobacco: Never Used  . Alcohol Use: No    Family History Family History  Problem Relation Age of Onset  . Tuberculosis Mother   . Cancer Sister     Breast     No Known Allergies  Current Outpatient Prescriptions  Medication Sig Dispense Refill  . acetaminophen (TYLENOL) 325 MG tablet Take 650 mg by mouth every 6 (six) hours as needed.        . alendronate (FOSAMAX) 70 MG tablet TAKE 1 TABLET BY MOUTH EVERY WEEK TAKE WITH 8 OUNCES OF WATER AND ON EMPTY STOMACH AS DIRECTED  12 tablet  3  . aspirin 81 MG tablet Take 81 mg by mouth daily.        . diltiazem (CARDIZEM CD) 120 MG 24 hr capsule TAKE ONE CAPSULE BY MOUTH EVERY DAY  90 capsule  3  . finasteride (PROSCAR) 5 MG tablet TAKE 1   TABLET BY MOUTH EVERY DAY  90 tablet  3  . fluticasone (FLONASE) 50 MCG/ACT nasal spray Place 2 sprays into the nose daily.  16 g  6  . folic acid (FOLVITE) 400 MCG tablet Take 400 mcg by mouth daily.        . leflunomide (ARAVA) 20 MG tablet Take 1 tablet by mouth daily.      . lisinopril (PRINIVIL,ZESTRIL) 20 MG tablet TAKE 1 TABLET EVERY DAY BY MOUTH AS DIRECTED  90 tablet  4  . methotrexate (RHEUMATREX) 2.5 MG tablet Take 2.5 mg by mouth daily. Caution:Chemotherapy.      . metoprolol succinate (TOPROL-XL) 100 MG 24 hr tablet TAKE 1 TABLET BY MOUTH EVERY DAY  90 tablet  1  . simvastatin (ZOCOR) 40 MG tablet TAKE 1 TABLET BY MOUTH AT BEDTIME  90 tablet  4  . warfarin (COUMADIN) 5 MG tablet Take 1 tablet (5 mg total) by mouth as directed.  30 tablet  3  . ciprofloxacin (CIPRO)  500 MG tablet Take 1 tablet (500 mg total) by mouth 2 (two) times daily.  20 tablet  0    Physical Examination  Filed Vitals:   07/14/12 1028  BP: 98/65  Pulse: 84  Temp: 97.7 F (36.5 C)  Resp: 14    Body mass index is 20.26 kg/(m^2).  General:  WDWN in NAD Gait: Normal HEENT: WNL Eyes: Pupils equal Pulmonary: normal non-labored breathing , without Rales, rhonchi,  wheezing Cardiac: RRR, without  Murmurs, rubs or gallops; No carotid bruits Abdomen: soft, NT, no masses Skin: no rashes, ulcers noted Vascular Exam/Pulses: Lower show a pulses are not palpable  Extremities without ischemic changes, no Gangrene , no cellulitis; no open wounds;  Musculoskeletal: no muscle wasting or atrophy  Neurologic: A&O X 3; Appropriate Affect ; SENSATION: normal; MOTOR FUNCTION:  moving all extremities equally. Speech is fluent/normal  Non-Invasive Vascular Imaging: No imaging today  ASSESSMENT/PLAN: Dr. Dickson examined the patient as well as I. Dr. Dickson explained to the patient and his wife that he's afraid there may be infection in the bone and the patient needs an arteriogram to determine if there is enough blood flow to heal if the patient requires a great toe amputation. It was explained to the patient and his wife this will be set up for 07/19/2012 and the patient will followup with Dr. Dickson on the 12th to discuss his findings. Dr. Dickson explained he will possibly intervene during the arteriogram if that will improve the blood flow enough to proceed with amputation if necessary. The patient and his wife are in agreement with this, their questions were encouraged and answered. Patient was given Cipro 500 mg twice a day for 10 days. He will followup with Dr. Dickson after the procedure.  Clare Fennimore ANP  Clinic M.D.: Dickson  

## 2012-07-14 NOTE — Telephone Encounter (Signed)
The pt does have a history of TIA.  Per Dr Excell Seltzer the pt will needs to be bridged with lovenox prior to angiogram. I spoke with Okey Regal and made her aware of this information. I spoke with Selena Batten in the [facility] and she will contact the pt.  The pt will be seen on 07/16/12 in the coumadin clinic for lovenox instructions. I contacted the pt about this information.

## 2012-07-15 ENCOUNTER — Ambulatory Visit (INDEPENDENT_AMBULATORY_CARE_PROVIDER_SITE_OTHER): Payer: Medicare Other

## 2012-07-15 DIAGNOSIS — Z8679 Personal history of other diseases of the circulatory system: Secondary | ICD-10-CM

## 2012-07-15 DIAGNOSIS — I4891 Unspecified atrial fibrillation: Secondary | ICD-10-CM

## 2012-07-15 LAB — POCT INR: INR: 3.5

## 2012-07-15 MED ORDER — ENOXAPARIN SODIUM 60 MG/0.6ML ~~LOC~~ SOLN: 60.0000 mg | Freq: Two times a day (BID) | SUBCUTANEOUS | Status: DC

## 2012-07-15 NOTE — Patient Instructions (Addendum)
07/16/12 Start taking Lovenox 60mg  in the am and 60mg  in the pm. 07/17/12 Continue on Lovenox 60mg  in the am and 60mg  in the pm. 07/18/12 Take your last dosage of Lovenox 60mg  in the am prior to procedure on 07/19/12.  Resume Coumadin on 07/19/12 if ok with MD take 1 tablet x 2 days, then resume same dosage 1/2 tablet daily except 1 tablet on Thursdays.  Resume Lovenox twice daily once ok with MD.  Continue until INR rechecked.

## 2012-07-17 ENCOUNTER — Other Ambulatory Visit: Payer: Self-pay | Admitting: Internal Medicine

## 2012-07-19 ENCOUNTER — Ambulatory Visit (HOSPITAL_COMMUNITY): Payer: Medicare Other

## 2012-07-19 ENCOUNTER — Other Ambulatory Visit: Payer: Self-pay

## 2012-07-19 ENCOUNTER — Other Ambulatory Visit: Payer: Self-pay | Admitting: *Deleted

## 2012-07-19 ENCOUNTER — Telehealth: Payer: Self-pay | Admitting: Vascular Surgery

## 2012-07-19 ENCOUNTER — Ambulatory Visit (HOSPITAL_COMMUNITY)
Admission: RE | Admit: 2012-07-19 | Discharge: 2012-07-19 | Disposition: A | Discharge status: Home / Self Care | Payer: Medicare Other | Source: Ambulatory Visit | Attending: Vascular Surgery | Admitting: Vascular Surgery

## 2012-07-19 ENCOUNTER — Encounter (HOSPITAL_COMMUNITY)
Admission: RE | Disposition: A | Discharge status: Home / Self Care | Payer: Self-pay | Source: Ambulatory Visit | Attending: Vascular Surgery

## 2012-07-19 DIAGNOSIS — X58XXXA Exposure to other specified factors, initial encounter: Secondary | ICD-10-CM | POA: Insufficient documentation

## 2012-07-19 DIAGNOSIS — I739 Peripheral vascular disease, unspecified: Secondary | ICD-10-CM

## 2012-07-19 DIAGNOSIS — M79609 Pain in unspecified limb: Secondary | ICD-10-CM

## 2012-07-19 DIAGNOSIS — Z8673 Personal history of transient ischemic attack (TIA), and cerebral infarction without residual deficits: Secondary | ICD-10-CM | POA: Insufficient documentation

## 2012-07-19 DIAGNOSIS — L98499 Non-pressure chronic ulcer of skin of other sites with unspecified severity: Secondary | ICD-10-CM

## 2012-07-19 DIAGNOSIS — I252 Old myocardial infarction: Secondary | ICD-10-CM | POA: Insufficient documentation

## 2012-07-19 DIAGNOSIS — Z7901 Long term (current) use of anticoagulants: Secondary | ICD-10-CM | POA: Insufficient documentation

## 2012-07-19 DIAGNOSIS — S91109A Unspecified open wound of unspecified toe(s) without damage to nail, initial encounter: Secondary | ICD-10-CM | POA: Insufficient documentation

## 2012-07-19 DIAGNOSIS — Z79899 Other long term (current) drug therapy: Secondary | ICD-10-CM | POA: Insufficient documentation

## 2012-07-19 DIAGNOSIS — E785 Hyperlipidemia, unspecified: Secondary | ICD-10-CM | POA: Insufficient documentation

## 2012-07-19 DIAGNOSIS — N182 Chronic kidney disease, stage 2 (mild): Secondary | ICD-10-CM | POA: Insufficient documentation

## 2012-07-19 DIAGNOSIS — I70209 Unspecified atherosclerosis of native arteries of extremities, unspecified extremity: Secondary | ICD-10-CM | POA: Insufficient documentation

## 2012-07-19 HISTORY — PX: LOWER EXTREMITY ANGIOGRAM: SHX5508

## 2012-07-19 LAB — POCT I-STAT, CHEM 8
BUN: 15 mg/dL (ref 6–23)
Chloride: 102 mEq/L (ref 96–112)
Sodium: 140 mEq/L (ref 135–145)

## 2012-07-19 LAB — APTT: aPTT: 43 seconds — ABNORMAL HIGH (ref 24–37)

## 2012-07-19 SURGERY — ANGIOGRAM, LOWER EXTREMITY
Anesthesia: LOCAL | Laterality: Left

## 2012-07-19 MED ORDER — OXYCODONE-ACETAMINOPHEN 5-325 MG PO TABS: 1.0000 | ORAL_TABLET | ORAL | Status: DC | PRN

## 2012-07-19 MED ORDER — SODIUM CHLORIDE 0.9 % IV SOLN: 1.0000 mL/kg/h | INTRAVENOUS | Status: DC

## 2012-07-19 MED ORDER — OXYCODONE-ACETAMINOPHEN 5-325 MG PO TABS
ORAL_TABLET | ORAL | Status: AC
  Filled 2012-07-19: qty 2

## 2012-07-19 MED ORDER — FENTANYL CITRATE 0.05 MG/ML IJ SOLN
INTRAMUSCULAR | Status: AC
  Filled 2012-07-19: qty 2

## 2012-07-19 MED ORDER — LIDOCAINE HCL (PF) 1 % IJ SOLN
INTRAMUSCULAR | Status: AC
  Filled 2012-07-19: qty 30

## 2012-07-19 MED ORDER — MIDAZOLAM HCL 2 MG/2ML IJ SOLN
INTRAMUSCULAR | Status: AC
  Filled 2012-07-19: qty 2

## 2012-07-19 MED ORDER — HEPARIN (PORCINE) IN NACL 2-0.9 UNIT/ML-% IJ SOLN
INTRAMUSCULAR | Status: AC
  Filled 2012-07-19: qty 1000

## 2012-07-19 MED ORDER — ACETAMINOPHEN 325 MG PO TABS: 650.0000 mg | ORAL_TABLET | ORAL | Status: DC | PRN

## 2012-07-19 MED ORDER — ONDANSETRON HCL 4 MG/2ML IJ SOLN: 4.0000 mg | Freq: Four times a day (QID) | INTRAMUSCULAR | Status: DC | PRN

## 2012-07-19 MED ORDER — SODIUM CHLORIDE 0.9 % IV SOLN
INTRAVENOUS | Status: DC
  Administered 2012-07-19: 1000 mL via INTRAVENOUS

## 2012-07-19 MED ORDER — OXYCODONE-ACETAMINOPHEN 5-325 MG PO TABS
1.0000 | ORAL_TABLET | ORAL | Status: DC | PRN
  Administered 2012-07-19: 2 via ORAL

## 2012-07-19 NOTE — Interval H&P Note (Signed)
History and Physical Interval Note:  07/19/2012 7:36 AM  [name]  has presented today for surgery, with the diagnosis of left foot wound/pvd  The various methods of treatment have been discussed with the patient and family. After consideration of risks, benefits and other options for treatment, the patient has consented to  Procedure(s): LOWER EXTREMITY ANGIOGRAM (Left) as a surgical intervention .  The patient's history has been reviewed, patient examined, no change in status, stable for surgery.  I have reviewed the patient's chart and labs.  Questions were answered to the patient's satisfaction.     Janene Yousuf S

## 2012-07-19 NOTE — Telephone Encounter (Addendum)
Message copied by Fredrich Birks on Mon Jul 19, 2012 11:16 AM ------      Message from: Melene Plan      Created: Mon Jul 19, 2012 10:28 AM      Regarding: FW: charge and f/u       This is regarding 2 patients. Be careful      ----- Message -----         From: Chuck Hint, MD         Sent: 07/19/2012   8:33 AM           To: Reuel Derby, Melene Plan, RN, #      Subject: charge and f/u                                           This patient had ultrasound-guided access to the left common femoral artery, aortogram with bilateral lower extremity arteriogram. He needs a follow up visit in around 2 weeks to follow his left toe wound.              ------ LM with pt's wife and mailed appointment letter, dpm

## 2012-07-19 NOTE — Op Note (Signed)
PATIENT: [name]   MRN: [id number] DOB: 15-Jan-1932    DATE OF PROCEDURE: 07/19/2012  INDICATIONS: [name] is a 79 y.o. male who presents with a wound on the medial aspect of his left great toe. He is brought in for diagnostic arteriography.  PROCEDURE:  1. Sound guided access to the left common femoral artery 2. Aortogram with bilateral iliac arteriogram and bilateral lower extremity runoff 3. Retrograde left femoral  SURGEON: Di Kindle. [name], MD, FACS  ANESTHESIA: local with sedation   EBL: minimal  TECHNIQUE: The patient was brought to the peripheral vascular lab and received 1 mg of Versed and 50 mcg of fentanyl. Both groins were prepped and draped in the usual sterile fashion. After the skin was infiltrated with 1% lidocaine, and under ultrasound guidance, the left common femoral artery was cannulated and a guidewire introduced into the infrarenal aorta under fluoroscopic control. A 5 French sheath was introduced over the wire. A pigtail catheter was positioned at the L1 vertebral body a flush aortogram obtained. The catheter was positioned above the aortic bifurcation and oblique iliac projection was obtained. Next bilateral lower extremity runoff films were obtained. The pigtail catheter was then removed over a wire and a retrograde left femoral arteriogram obtained with spot films of the left leg. At the completion of the procedure, the patient was transferred to the holding area for removal of the sheath.  FINDINGS:  1. There are single renal arteries bilaterally with no significant renal artery stenosis identified. There is mild diffuse disease of the infrarenal aorta but no focal stenosis. Both common iliacs, and external iliacs, and hypogastric arteries are patent bilaterally with mild diffuse disease. 2. On the right side, the common femoral and deep femoral artery are patent. The superficial femoral arteries occluded at its origin with reconstitution of the  popliteal artery at the level of the knee. The femoropopliteal bypass graft on the right is widely patent and is anastomosed to the below knee popliteal artery. The anterior tibial and posterior tibial arteries are occluded. There is single vessel runoff on the right via the peroneal artery. 3. On the left side, the common femoral and deep femoral artery patent. The superficial femoral artery is patent. There is moderate diffuse disease of the adductor canal on the left but no focal stenosis which appears to be flow limiting. There is single vessel runoff on the left the of the peroneal artery with some stenosis of the distal tibial peroneal trunk to the anterior tibial and posterior tibial arteries are occluded. There is severe disease in the foot with an occluded dorsalis pedis artery and a diseased plantar artery.  Waverly Ferrari, MD, FACS [facility] [place]  DATE OF DICTATION:   07/19/2012

## 2012-07-19 NOTE — H&P (View-Only) (Signed)
[facility] [place] [facility] Office Note  CC: Infected left foot/painful Referring Physician: Edilia Hill  History of Present Illness: 79 year old male patient of Dr. Edilia Hill status post right femoropopliteal bypass in 2010. The patient called the office asking to be seen due to to a painful open wound on his left foot lateral to the great toe. The patient states this has been opening for 2 weeks with yellow drainage. The patient noticed increasing edema yesterday.  Past Medical History  Diagnosis Date  . CARCINOMA, LUNG, SQUAMOUS CELL 08/21/2008  . HYPERLIPIDEMIA 11/13/2006  . ANEMIA 09/07/2009  . MYOCARDIAL INFARCTION, HX OF 11/13/2006  . CORONARY ARTERY DISEASE 11/13/2006  . Atrial fibrillation 11/13/2006  . CONGESTIVE HEART FAILURE 02/01/2007  . CAROTID ARTERY STENOSIS, WITHOUT INFARCTION 09/07/2008  . KIDNEY DISEASE, CHRONIC, STAGE II 10/13/2007  . Rheumatoid arthritis 11/13/2006  . OSTEOPENIA 11/13/2006  . Cancer of prostate 10/24/2009  . TRANSIENT ISCHEMIC ATTACK, HX OF 11/13/2006  . COLONIC POLYPS, HX OF 07/04/2003    6 mm adenoma  . Femoral artery occlusion     right  . PAD (peripheral artery disease)   . Stroke 2008    ROS: [x]  Positive   [ ]  Denies    General: [ ]  Weight loss, [ ]  Fever, [ ]  chills Neurologic: [ ]  Dizziness, [ ]  Blackouts, [ ]  Seizure [ ]  Stroke, [ ]  "Mini stroke", [ ]  Slurred speech, [ ]  Temporary blindness; [ ]  weakness in arms or legs, [ ]  Hoarseness Cardiac: [ ]  Chest pain/pressure, [ ]  Shortness of breath at rest [ ]  Shortness of breath with exertion, [ ]  Atrial fibrillation or irregular heartbeat Vascular: [ ]  Pain in legs with walking, [ ]  Pain in legs at rest, [ ]  Pain in legs at night,  [ ]  Non-healing ulcer, [ ]  Blood clot in vein/DVT,   Pulmonary: [ ]  Home oxygen, [ ]  Productive cough, [ ]  Coughing up blood, [ ]  Asthma,  [ ]  Wheezing Musculoskeletal:  [ ]  Arthritis, [ ]  Low back pain, [ ]  Joint pain Hematologic: [ ]  Easy Bruising, [ ]  Anemia;  [ ]  Hepatitis Gastrointestinal: [ ]  Blood in stool, [ ]  Gastroesophageal Reflux/heartburn, [ ]  Trouble swallowing Urinary: [ ]  chronic Kidney disease, [ ]  on HD - [ ]  MWF or [ ]  TTHS, [ ]  Burning with urination, [ ]  Difficulty urinating Skin: [ ]  Rashes, [ ]  Wounds Psychological: [ ]  Anxiety, [ ]  Depression   Social History History  Substance Use Topics  . Smoking status: Former Smoker    Quit date: 06/09/2001  . Smokeless tobacco: Never Used  . Alcohol Use: No    Family History Family History  Problem Relation Age of Onset  . Tuberculosis Mother   . Cancer Sister     Breast     No Known Allergies  Current Outpatient Prescriptions  Medication Sig Dispense Refill  . acetaminophen (TYLENOL) 325 MG tablet Take 650 mg by mouth every 6 (six) hours as needed.        Marland Kitchen alendronate (FOSAMAX) 70 MG tablet TAKE 1 TABLET BY MOUTH EVERY WEEK TAKE WITH 8 OUNCES OF WATER AND ON EMPTY STOMACH AS DIRECTED  12 tablet  3  . aspirin 81 MG tablet Take 81 mg by mouth daily.        Marland Kitchen diltiazem (CARDIZEM CD) 120 MG 24 hr capsule TAKE ONE CAPSULE BY MOUTH EVERY DAY  90 capsule  3  . finasteride (PROSCAR) 5 MG tablet TAKE 1  TABLET BY MOUTH EVERY DAY  90 tablet  3  . fluticasone (FLONASE) 50 MCG/ACT nasal spray Place 2 sprays into the nose daily.  16 g  6  . folic acid (FOLVITE) 400 MCG tablet Take 400 mcg by mouth daily.        Marland Kitchen leflunomide (ARAVA) 20 MG tablet Take 1 tablet by mouth daily.      Marland Kitchen lisinopril (PRINIVIL,ZESTRIL) 20 MG tablet TAKE 1 TABLET EVERY DAY BY MOUTH AS DIRECTED  90 tablet  4  . methotrexate (RHEUMATREX) 2.5 MG tablet Take 2.5 mg by mouth daily. Caution:Chemotherapy.      . metoprolol succinate (TOPROL-XL) 100 MG 24 hr tablet TAKE 1 TABLET BY MOUTH EVERY DAY  90 tablet  1  . simvastatin (ZOCOR) 40 MG tablet TAKE 1 TABLET BY MOUTH AT BEDTIME  90 tablet  4  . warfarin (COUMADIN) 5 MG tablet Take 1 tablet (5 mg total) by mouth as directed.  30 tablet  3  . ciprofloxacin (CIPRO)  500 MG tablet Take 1 tablet (500 mg total) by mouth 2 (two) times daily.  20 tablet  0    Physical Examination  Filed Vitals:   07/14/12 1028  BP: 98/65  Pulse: 84  Temp: 97.7 F (36.5 C)  Resp: 14    Body mass index is 20.26 kg/(m^2).  General:  WDWN in NAD Gait: Normal HEENT: WNL Eyes: Pupils equal Pulmonary: normal non-labored breathing , without Rales, rhonchi,  wheezing Cardiac: RRR, without  Murmurs, rubs or gallops; No carotid bruits Abdomen: soft, NT, no masses Skin: no rashes, ulcers noted Vascular Exam/Pulses: Lower show a pulses are not palpable  Extremities without ischemic changes, no Gangrene , no cellulitis; no open wounds;  Musculoskeletal: no muscle wasting or atrophy  Neurologic: A&O X 3; Appropriate Affect ; SENSATION: normal; MOTOR FUNCTION:  moving all extremities equally. Speech is fluent/normal  Non-Invasive Vascular Imaging: No imaging today  ASSESSMENT/PLAN: Dr. Edilia Hill examined the patient as well as I. Dr. Edilia Hill explained to the patient and his wife that he's afraid there may be infection in the bone and the patient needs an arteriogram to determine if there is enough blood flow to heal if the patient requires a great toe amputation. It was explained to the patient and his wife this will be set up for 07/19/2012 and the patient will followup with Dr. Edilia Hill on the 12th to discuss his findings. Dr. Edilia Hill explained he will possibly intervene during the arteriogram if that will improve the blood flow enough to proceed with amputation if necessary. The patient and his wife are in agreement with this, their questions were encouraged and answered. Patient was given Cipro 500 mg twice a day for 10 days. He will followup with Dr. Edilia Hill after the procedure.  [name] ANP  Clinic M.D.: [name]

## 2012-07-21 ENCOUNTER — Ambulatory Visit: Payer: Medicare Other | Admitting: Vascular Surgery

## 2012-07-26 ENCOUNTER — Ambulatory Visit (INDEPENDENT_AMBULATORY_CARE_PROVIDER_SITE_OTHER): Payer: Medicare Other | Admitting: Pharmacist

## 2012-07-26 DIAGNOSIS — Z8679 Personal history of other diseases of the circulatory system: Secondary | ICD-10-CM

## 2012-07-26 DIAGNOSIS — I4891 Unspecified atrial fibrillation: Secondary | ICD-10-CM

## 2012-07-26 LAB — POCT INR: INR: 3

## 2012-07-28 ENCOUNTER — Encounter: Payer: Self-pay | Admitting: Vascular Surgery

## 2012-07-29 ENCOUNTER — Ambulatory Visit (HOSPITAL_BASED_OUTPATIENT_CLINIC_OR_DEPARTMENT_OTHER): Payer: Medicare Other

## 2012-07-29 ENCOUNTER — Other Ambulatory Visit: Payer: Medicare Other | Admitting: Lab

## 2012-07-29 VITALS — BP 96/57 | HR 78 | Temp 96.9°F

## 2012-07-29 DIAGNOSIS — D649 Anemia, unspecified: Secondary | ICD-10-CM

## 2012-07-29 LAB — CBC WITH DIFFERENTIAL/PLATELET
Basophils Absolute: 0.2 10*3/uL — ABNORMAL HIGH (ref 0.0–0.1)
HCT: 24.7 % — ABNORMAL LOW (ref 38.4–49.9)
HGB: 8.1 g/dL — ABNORMAL LOW (ref 13.0–17.1)
MONO#: 0.9 10*3/uL (ref 0.1–0.9)
NEUT%: 53.8 % (ref 39.0–75.0)
WBC: 5.6 10*3/uL (ref 4.0–10.3)
lymph#: 1.1 10*3/uL (ref 0.9–3.3)

## 2012-07-29 MED ORDER — DARBEPOETIN ALFA-POLYSORBATE 300 MCG/0.6ML IJ SOLN
300.0000 ug | INTRAMUSCULAR | Status: DC
  Administered 2012-07-29: 300 ug via SUBCUTANEOUS
  Filled 2012-07-29: qty 0.6

## 2012-08-03 ENCOUNTER — Encounter: Payer: Self-pay | Admitting: Vascular Surgery

## 2012-08-04 ENCOUNTER — Encounter: Payer: Self-pay | Admitting: Vascular Surgery

## 2012-08-04 ENCOUNTER — Ambulatory Visit (INDEPENDENT_AMBULATORY_CARE_PROVIDER_SITE_OTHER): Payer: Medicare Other | Admitting: Vascular Surgery

## 2012-08-04 VITALS — BP 143/53 | HR 71 | Ht 69.0 in | Wt 143.0 lb

## 2012-08-04 DIAGNOSIS — I739 Peripheral vascular disease, unspecified: Secondary | ICD-10-CM

## 2012-08-04 DIAGNOSIS — L98499 Non-pressure chronic ulcer of skin of other sites with unspecified severity: Secondary | ICD-10-CM

## 2012-08-04 MED ORDER — CEPHALEXIN 500 MG PO CAPS: 500.0000 mg | ORAL_CAPSULE | Freq: Three times a day (TID) | ORAL | Status: DC

## 2012-08-04 NOTE — Progress Notes (Signed)
[facility] [place]  Patient name: [name] MRN: [id number] DOB: 09-15-1931 Sex: male  REASON FOR VISIT: follow up of left great toe wound.  HPI: [name] is a 79 y.o. male who had been seen in our office with some drainage from his left great toe. He underwent an arteriogram on 07/19/2012. This showed some mild diffuse aortoiliac disease and infrainguinal arterial occlusive disease with severe tibial artery occlusive disease. He did have some plaque in the left common femoral artery which could potentially be flow-limiting. He also had an occluded anterior tibial and posterior tibial artery with single vessel runoff via the peroneal artery on the left. We also obtained an x-ray of the left foot which suggested osteomyelitis of the first metatarsal. He comes in today to discuss these results. He has had some drainage from the left great toe wound.  He denies significant claudication although his activity is fairly limited. He denies rest pain. He has undergone right femoral thrombectomy and right common femoral artery endarterectomy with profundoplasty in 2003. He also has a history of coronary artery disease. In addition he has history of congestive heart failure. He had a resection of a lung cancer in the past also.   REVIEW OF SYSTEMS: Arly.Keller ] denotes positive finding; [  ] denotes negative finding  CARDIOVASCULAR:  [ ]  chest pain   [ ]  dyspnea on exertion    CONSTITUTIONAL:  [ ]  fever   [ ]  chills  PHYSICAL EXAM: Filed Vitals:   08/04/12 1013  BP: 143/53  Pulse: 71  Height: 5\' 9"  (1.753 m)  Weight: 143 lb (64.864 kg)  SpO2: 100%   Body mass index is 21.11 kg/(m^2). GENERAL: The patient is a well-nourished male, in no acute distress. The vital signs are documented above. CARDIOVASCULAR: There is a regular rate and rhythm  PULMONARY: There is good air exchange bilaterally without wheezing or rales. He has palpable femoral pulses although he clearly has  some plaque in the left common femoral artery which is palpable. As a peroneal signal with the Doppler.  Results of his arteriogram an x-ray of his foot are described above.  MEDICAL ISSUES:  Atherosclerosis of native arteries of the extremities with ulceration(440.23) This patient has a draining wound from his left great toe likely related to osteomyelitis of the metatarsal head. He has diffuse peripheral vascular disease with single vessel runoff via the peroneal artery on the left. I think the only option for revascularization which would significantly impact the distal circulation would be a left common femoral artery endarterectomy with vein patch. Given that the x-ray suggests osteo- of the left great toe with continued drainage from the wound I have recommended ray amputation of the left great toe. In order to give him the best chance of healing this I recommended left common femoral artery endarterectomy and vein patch angioplasty at the same time. However, still there is significant risk of nonhealing given that he only has peroneal runoff on the left. I have written him a prescription for Keflex. Currently the patient is very concerned about having surgery given that he has history of heart problems and previous lung cancer. I have offered to arrange for him to see Dr. Excell Seltzer for preoperative cardiac evaluation. Currently he would simply like to hold off on any further workup at this time. He is agreeable to return in 3-4 weeks I can keep an eye on the left great toe. Of note he has not had any  recent cardiac symptoms and denies any recent chest pain or chest pressure. He is on Coumadin and if he were to be agreeable for surgery with the need to stop this preoperatively.   DICKSON,[name] S [facility] [place] [facility]: [phone]

## 2012-08-04 NOTE — Assessment & Plan Note (Signed)
This patient has a draining wound from his left great toe likely related to osteomyelitis of the metatarsal head. He has diffuse peripheral vascular disease with single vessel runoff via the peroneal artery on the left. I think the only option for revascularization which would significantly impact the distal circulation would be a left common femoral artery endarterectomy with vein patch. Given that the x-ray suggests osteo- of the left great toe with continued drainage from the wound I have recommended ray amputation of the left great toe. In order to give him the best chance of healing this I recommended left common femoral artery endarterectomy and vein patch angioplasty at the same time. However, still there is significant risk of nonhealing given that he only has peroneal runoff on the left. I have written him a prescription for Keflex. Currently the patient is very concerned about having surgery given that he has history of heart problems and previous lung cancer. I have offered to arrange for him to see Dr. Excell Seltzer for preoperative cardiac evaluation. Currently he would simply like to hold off on any further workup at this time. He is agreeable to return in 3-4 weeks I can keep an eye on the left great toe. Of note he has not had any recent cardiac symptoms and denies any recent chest pain or chest pressure. He is on Coumadin and if he were to be agreeable for surgery with the need to stop this preoperatively.

## 2012-08-09 ENCOUNTER — Ambulatory Visit (INDEPENDENT_AMBULATORY_CARE_PROVIDER_SITE_OTHER): Payer: Medicare Other

## 2012-08-09 DIAGNOSIS — Z8679 Personal history of other diseases of the circulatory system: Secondary | ICD-10-CM

## 2012-08-09 DIAGNOSIS — I4891 Unspecified atrial fibrillation: Secondary | ICD-10-CM

## 2012-08-24 ENCOUNTER — Encounter: Payer: Self-pay | Admitting: Vascular Surgery

## 2012-08-25 ENCOUNTER — Encounter: Payer: Self-pay | Admitting: Vascular Surgery

## 2012-08-25 ENCOUNTER — Ambulatory Visit (INDEPENDENT_AMBULATORY_CARE_PROVIDER_SITE_OTHER): Payer: Medicare Other | Admitting: Vascular Surgery

## 2012-08-25 VITALS — BP 127/70 | HR 65 | Resp 16 | Ht 69.0 in | Wt 144.0 lb

## 2012-08-25 DIAGNOSIS — I739 Peripheral vascular disease, unspecified: Secondary | ICD-10-CM

## 2012-08-25 DIAGNOSIS — Z5189 Encounter for other specified aftercare: Secondary | ICD-10-CM

## 2012-08-25 DIAGNOSIS — L98499 Non-pressure chronic ulcer of skin of other sites with unspecified severity: Secondary | ICD-10-CM

## 2012-08-25 DIAGNOSIS — I70219 Atherosclerosis of native arteries of extremities with intermittent claudication, unspecified extremity: Secondary | ICD-10-CM

## 2012-08-25 NOTE — Progress Notes (Signed)
[facility]ist of [place]  Patient name: [name] MRN: [id number] DOB: Jun 07, 1932 Sex: male  REASON FOR VISIT: follow up of left great toe wound.  HPI: [name] is a 79 y.o. male was underwent a previous right femoropopliteal bypass graft. He developed a wound on his left great toe over the metatarsal head on the medial aspect of his foot. He underwent diagnostic arteriography which showed that the left common femoral deep femoral and superficial femoral artery were patent. There was some mild disease in the adductor canal in the left but no focal stenosis which appeared to be significantly flow limiting. There was single vessel runoff on the left via the peroneal artery with some stenosis in the distal tibial peroneal trunk area there were no patent vessels in the foot. I did not see any options for revascularization would significantly impact the circulation in the foot.  He states that since his arteriogram he feels that the wound on the foot has improved. There has been no significant drainage. He denies any fever. He denies rest pain.   REVIEW OF SYSTEMS: Arly.Keller ] denotes positive finding; [  ] denotes negative finding  CARDIOVASCULAR:  [ ]  chest pain   [ ]  dyspnea on exertion    CONSTITUTIONAL:  [ ]  fever   [ ]  chills  PHYSICAL EXAM: Filed Vitals:   08/25/12 1328  BP: 127/70  Pulse: 65  Resp: 16  Height: 5\' 9"  (1.753 m)  Weight: 144 lb (65.318 kg)  SpO2: 100%   Body mass index is 21.26 kg/(m^2). GENERAL: The patient is a well-nourished male, in no acute distress. The vital signs are documented above. CARDIOVASCULAR: There is a regular rate and rhythm  PULMONARY: There is good air exchange bilaterally without wheezing or rales. He has a 5 mm wound over the medial aspect of his left metatarsal head. There is no drainage currently.  MEDICAL ISSUES:  Atherosclerosis of native arteries of the extremities with ulceration(440.23) This patient has a small wound  over his left metatarsal head on the medial aspect of his foot which he says he has improved. He has severe tibial artery occlusive disease with single vessel runoff via the peroneal artery. I do not see any options for revascularization it was significantly impact his distal circulation. He may ultimately require a left great toe amputation however given that the wound has improved I am reluctant to proceed with toe amputation at this point given that there is a significant risk of this nonhealing. If the wound does progress then I think we would need to proceed with Ray amputation of the left great toe with significant risk of nonhealing. Scheduled to see him in late April and we'll check on his wound at that time. He knows to call sooner if he has problems. In the meantime he'll keep the wound clean and dry and keep a Band-Aid over the wound.   Mattew Chriswell S [facility] [place] Beeper: [phone]

## 2012-08-25 NOTE — Assessment & Plan Note (Signed)
This patient has a small wound over his left metatarsal head on the medial aspect of his foot which he says he has improved. He has severe tibial artery occlusive disease with single vessel runoff via the peroneal artery. I do not see any options for revascularization it was significantly impact his distal circulation. He may ultimately require a left great toe amputation however given that the wound has improved I am reluctant to proceed with toe amputation at this point given that there is a significant risk of this nonhealing. If the wound does progress then I think we would need to proceed with Ray amputation of the left great toe with significant risk of nonhealing. Scheduled to see him in late April and we'll check on his wound at that time. He knows to call sooner if he has problems. In the meantime he'll keep the wound clean and dry and keep a Band-Aid over the wound.

## 2012-08-26 ENCOUNTER — Other Ambulatory Visit (HOSPITAL_BASED_OUTPATIENT_CLINIC_OR_DEPARTMENT_OTHER): Payer: Medicare Other | Admitting: Lab

## 2012-08-26 ENCOUNTER — Ambulatory Visit (HOSPITAL_BASED_OUTPATIENT_CLINIC_OR_DEPARTMENT_OTHER): Payer: Medicare Other

## 2012-08-26 VITALS — BP 122/48 | HR 68 | Temp 97.7°F

## 2012-08-26 DIAGNOSIS — D649 Anemia, unspecified: Secondary | ICD-10-CM

## 2012-08-26 DIAGNOSIS — C3490 Malignant neoplasm of unspecified part of unspecified bronchus or lung: Secondary | ICD-10-CM

## 2012-08-26 LAB — CBC WITH DIFFERENTIAL/PLATELET
Basophils Absolute: 0.2 10*3/uL — ABNORMAL HIGH (ref 0.0–0.1)
Eosinophils Absolute: 0.2 10*3/uL (ref 0.0–0.5)
HGB: 9.5 g/dL — ABNORMAL LOW (ref 13.0–17.1)
LYMPH%: 20 % (ref 14.0–49.0)
MCV: 92.7 fL (ref 79.3–98.0)
MONO%: 14.1 % — ABNORMAL HIGH (ref 0.0–14.0)
NEUT#: 4.1 10*3/uL (ref 1.5–6.5)
Platelets: 414 10*3/uL — ABNORMAL HIGH (ref 140–400)
RDW: 17.4 % — ABNORMAL HIGH (ref 11.0–14.6)

## 2012-08-26 MED ORDER — DARBEPOETIN ALFA-POLYSORBATE 300 MCG/0.6ML IJ SOLN
300.0000 ug | INTRAMUSCULAR | Status: DC
  Administered 2012-08-26: 300 ug via SUBCUTANEOUS
  Filled 2012-08-26: qty 0.6

## 2012-08-26 NOTE — Patient Instructions (Addendum)
Darbepoetin Alfa injection What is this medicine? DARBEPOETIN ALFA (dar be POE e tin AL fa) helps your body make more red blood cells. It is used to treat anemia caused by chronic kidney failure and chemotherapy. This medicine may be used for other purposes; ask your health care provider or pharmacist if you have questions. What should I tell my health care provider before I take this medicine? They need to know if you have any of these conditions: -blood clotting disorders or history of blood clots -cancer patient not on chemotherapy -cystic fibrosis -heart disease, such as angina, heart failure, or a history of a heart attack -hemoglobin level of 12 g/dL or greater -high blood pressure -low levels of folate, iron, or vitamin B12 -seizures -an unusual or allergic reaction to darbepoetin, erythropoietin, albumin, hamster proteins, latex, other medicines, foods, dyes, or preservatives -pregnant or trying to get pregnant -breast-feeding How should I use this medicine? This medicine is for injection into a vein or under the skin. It is usually given by a health care professional in a hospital or clinic setting. If you get this medicine at home, you will be taught how to prepare and give this medicine. Do not shake the solution before you withdraw a dose. Use exactly as directed. Take your medicine at regular intervals. Do not take your medicine more often than directed. It is important that you put your used needles and syringes in a special sharps container. Do not put them in a trash can. If you do not have a sharps container, call your pharmacist or healthcare provider to get one. Talk to your pediatrician regarding the use of this medicine in children. While this medicine may be used in children as young as 1 year for selected conditions, precautions do apply. Overdosage: If you think you have taken too much of this medicine contact a poison control center or emergency room at once. NOTE:  This medicine is only for you. Do not share this medicine with others. What if I miss a dose? If you miss a dose, take it as soon as you can. If it is almost time for your next dose, take only that dose. Do not take double or extra doses. What may interact with this medicine? Do not take this medicine with any of the following medications: -epoetin alfa This list may not describe all possible interactions. Give your health care provider a list of all the medicines, herbs, non-prescription drugs, or dietary supplements you use. Also tell them if you smoke, drink alcohol, or use illegal drugs. Some items may interact with your medicine. What should I watch for while using this medicine? Visit your prescriber or health care professional for regular checks on your progress and for the needed blood tests and blood pressure measurements. It is especially important for the doctor to make sure your hemoglobin level is in the desired range, to limit the risk of potential side effects and to give you the best benefit. Keep all appointments for any recommended tests. Check your blood pressure as directed. Ask your doctor what your blood pressure should be and when you should contact him or her. As your body makes more red blood cells, you may need to take iron, folic acid, or vitamin B supplements. Ask your doctor or health care provider which products are right for you. If you have kidney disease continue dietary restrictions, even though this medication can make you feel better. Talk with your doctor or health care professional about the   foods you eat and the vitamins that you take. What side effects may I notice from receiving this medicine? Side effects that you should report to your doctor or health care professional as soon as possible: -allergic reactions like skin rash, itching or hives, swelling of the face, lips, or tongue -breathing problems -changes in vision -chest pain -confusion, trouble speaking  or understanding -feeling faint or lightheaded, falls -high blood pressure -muscle aches or pains -pain, swelling, warmth in the leg -rapid weight gain -severe headaches -sudden numbness or weakness of the face, arm or leg -trouble walking, dizziness, loss of balance or coordination -seizures (convulsions) -swelling of the ankles, feet, hands -unusually weak or tired Side effects that usually do not require medical attention (report to your doctor or health care professional if they continue or are bothersome): -diarrhea -fever, chills (flu-like symptoms) -headaches -nausea, vomiting -redness, stinging, or swelling at site where injected This list may not describe all possible side effects. Call your doctor for medical advice about side effects. You may report side effects to FDA at 1-800-FDA-1088. Where should I keep my medicine? Keep out of the reach of children. Store in a refrigerator between 2 and 8 degrees C (36 and 46 degrees F). Do not freeze. Do not shake. Throw away any unused portion if using a single-dose vial. Throw away any unused medicine after the expiration date. NOTE: This sheet is a summary. It may not cover all possible information. If you have questions about this medicine, talk to your doctor, pharmacist, or health care provider.  2013, [employer]. (05/09/2008 10:23:57 AM)  

## 2012-08-30 ENCOUNTER — Ambulatory Visit (INDEPENDENT_AMBULATORY_CARE_PROVIDER_SITE_OTHER): Payer: Medicare Other | Admitting: *Deleted

## 2012-08-30 DIAGNOSIS — I4891 Unspecified atrial fibrillation: Secondary | ICD-10-CM

## 2012-08-30 DIAGNOSIS — Z8679 Personal history of other diseases of the circulatory system: Secondary | ICD-10-CM

## 2012-08-30 LAB — POCT INR: INR: 2.2

## 2012-09-13 ENCOUNTER — Encounter: Payer: Self-pay | Admitting: Internal Medicine

## 2012-09-13 ENCOUNTER — Ambulatory Visit (INDEPENDENT_AMBULATORY_CARE_PROVIDER_SITE_OTHER): Payer: Medicare Other | Admitting: Internal Medicine

## 2012-09-13 VITALS — BP 122/56 | HR 68 | Temp 97.7°F | Wt 145.0 lb

## 2012-09-13 DIAGNOSIS — I4891 Unspecified atrial fibrillation: Secondary | ICD-10-CM

## 2012-09-13 DIAGNOSIS — I2510 Atherosclerotic heart disease of native coronary artery without angina pectoris: Secondary | ICD-10-CM

## 2012-09-13 DIAGNOSIS — C3490 Malignant neoplasm of unspecified part of unspecified bronchus or lung: Secondary | ICD-10-CM

## 2012-09-13 DIAGNOSIS — M069 Rheumatoid arthritis, unspecified: Secondary | ICD-10-CM

## 2012-09-13 MED ORDER — FLUTICASONE PROPIONATE 50 MCG/ACT NA SUSP: 2.0000 | Freq: Every day | NASAL | Status: DC | PRN

## 2012-09-13 NOTE — Assessment & Plan Note (Signed)
On chronic AC  

## 2012-09-13 NOTE — Assessment & Plan Note (Signed)
Followed by rheumatology. 

## 2012-09-13 NOTE — Progress Notes (Signed)
Lung cancer-- sees oncology (no known disease)  RA-- sees rhematology  Chronic anticoagulation for afib- no bleeding complications  Reviewed pmh, psh, sochx   patient denies chest pain, shortness of breath, orthopnea. Denies lower extremity edema, abdominal pain, change in appetite, change in bowel movements. Patient denies rashes, musculoskeletal complaints. No other specific complaints in a complete review of systems.    well-developed well-nourished male in no acute distress. HEENT exam atraumatic, normocephalic, neck supple without jugular venous distention. Chest clear to auscultation cardiac exam S1-S2 are regular. Abdominal exam overweight with bowel sounds, soft and nontender. Extremities no edema. Neurologic exam is alert with a normal gait.

## 2012-09-13 NOTE — Assessment & Plan Note (Signed)
No sxs. Will contiue to monitor risk factors

## 2012-09-13 NOTE — Assessment & Plan Note (Signed)
Has regular f/u with oncology 

## 2012-09-16 ENCOUNTER — Other Ambulatory Visit: Payer: Self-pay | Admitting: *Deleted

## 2012-09-16 DIAGNOSIS — I739 Peripheral vascular disease, unspecified: Secondary | ICD-10-CM

## 2012-09-16 DIAGNOSIS — Z48812 Encounter for surgical aftercare following surgery on the circulatory system: Secondary | ICD-10-CM

## 2012-09-22 ENCOUNTER — Ambulatory Visit: Payer: Medicare Other | Admitting: Vascular Surgery

## 2012-09-23 ENCOUNTER — Ambulatory Visit: Payer: Medicare Other

## 2012-09-23 ENCOUNTER — Other Ambulatory Visit (HOSPITAL_BASED_OUTPATIENT_CLINIC_OR_DEPARTMENT_OTHER): Payer: Medicare Other | Admitting: Lab

## 2012-09-23 DIAGNOSIS — C3490 Malignant neoplasm of unspecified part of unspecified bronchus or lung: Secondary | ICD-10-CM

## 2012-09-23 DIAGNOSIS — D649 Anemia, unspecified: Secondary | ICD-10-CM

## 2012-09-23 LAB — CBC WITH DIFFERENTIAL/PLATELET
Basophils Absolute: 0.1 10*3/uL (ref 0.0–0.1)
EOS%: 3.1 % (ref 0.0–7.0)
Eosinophils Absolute: 0.2 10*3/uL (ref 0.0–0.5)
HCT: 37.4 % — ABNORMAL LOW (ref 38.4–49.9)
HGB: 11.6 g/dL — ABNORMAL LOW (ref 13.0–17.1)
MCH: 28 pg (ref 27.2–33.4)
MONO#: 0.7 10*3/uL (ref 0.1–0.9)
NEUT#: 3.3 10*3/uL (ref 1.5–6.5)
NEUT%: 61 % (ref 39.0–75.0)
lymph#: 1.2 10*3/uL (ref 0.9–3.3)

## 2012-09-23 MED ORDER — DARBEPOETIN ALFA-POLYSORBATE 300 MCG/0.6ML IJ SOLN: 300.0000 ug | INTRAMUSCULAR | Status: DC

## 2012-10-11 ENCOUNTER — Ambulatory Visit (INDEPENDENT_AMBULATORY_CARE_PROVIDER_SITE_OTHER): Payer: Medicare Other

## 2012-10-11 DIAGNOSIS — Z8679 Personal history of other diseases of the circulatory system: Secondary | ICD-10-CM

## 2012-10-11 DIAGNOSIS — I4891 Unspecified atrial fibrillation: Secondary | ICD-10-CM

## 2012-10-21 ENCOUNTER — Other Ambulatory Visit (HOSPITAL_BASED_OUTPATIENT_CLINIC_OR_DEPARTMENT_OTHER): Payer: Medicare Other

## 2012-10-21 ENCOUNTER — Ambulatory Visit: Payer: Medicare Other

## 2012-10-21 DIAGNOSIS — C3490 Malignant neoplasm of unspecified part of unspecified bronchus or lung: Secondary | ICD-10-CM

## 2012-10-21 DIAGNOSIS — D649 Anemia, unspecified: Secondary | ICD-10-CM

## 2012-10-21 LAB — CBC WITH DIFFERENTIAL/PLATELET
Basophils Absolute: 0.2 10*3/uL — ABNORMAL HIGH (ref 0.0–0.1)
Eosinophils Absolute: 0.1 10*3/uL (ref 0.0–0.5)
HCT: 31.5 % — ABNORMAL LOW (ref 38.4–49.9)
HGB: 10 g/dL — ABNORMAL LOW (ref 13.0–17.1)
LYMPH%: 11.5 % — ABNORMAL LOW (ref 14.0–49.0)
MONO#: 0.7 10*3/uL (ref 0.1–0.9)
NEUT#: 5.5 10*3/uL (ref 1.5–6.5)
NEUT%: 75.5 % — ABNORMAL HIGH (ref 39.0–75.0)
Platelets: 532 10*3/uL — ABNORMAL HIGH (ref 140–400)
WBC: 7.3 10*3/uL (ref 4.0–10.3)
lymph#: 0.8 10*3/uL — ABNORMAL LOW (ref 0.9–3.3)

## 2012-10-21 MED ORDER — DARBEPOETIN ALFA-POLYSORBATE 300 MCG/0.6ML IJ SOLN
300.0000 ug | INTRAMUSCULAR | Status: DC
  Filled 2012-10-21: qty 0.6

## 2012-10-21 NOTE — Progress Notes (Signed)
Order is to give Aranesp to keep HGB above 10.  Per Dr Clelia Croft hold today since hgb is 10.0

## 2012-11-02 ENCOUNTER — Encounter: Payer: Self-pay | Admitting: Vascular Surgery

## 2012-11-03 ENCOUNTER — Encounter (INDEPENDENT_AMBULATORY_CARE_PROVIDER_SITE_OTHER): Payer: Medicare Other | Admitting: *Deleted

## 2012-11-03 ENCOUNTER — Encounter: Payer: Self-pay | Admitting: Vascular Surgery

## 2012-11-03 ENCOUNTER — Ambulatory Visit (INDEPENDENT_AMBULATORY_CARE_PROVIDER_SITE_OTHER): Payer: Medicare Other | Admitting: Vascular Surgery

## 2012-11-03 VITALS — BP 117/61 | HR 72 | Ht 69.0 in | Wt 143.2 lb

## 2012-11-03 DIAGNOSIS — I70219 Atherosclerosis of native arteries of extremities with intermittent claudication, unspecified extremity: Secondary | ICD-10-CM

## 2012-11-03 DIAGNOSIS — Z48812 Encounter for surgical aftercare following surgery on the circulatory system: Secondary | ICD-10-CM

## 2012-11-03 DIAGNOSIS — I6529 Occlusion and stenosis of unspecified carotid artery: Secondary | ICD-10-CM

## 2012-11-03 DIAGNOSIS — I739 Peripheral vascular disease, unspecified: Secondary | ICD-10-CM

## 2012-11-03 NOTE — Progress Notes (Signed)
[facility] and [facility] Specialist of [place]  Patient name: [name] MRN: [id number] DOB: [birth year] Sex: male  REASON FOR VISIT: follow up of left great toe wound.  HPI: [name] is a 79 y.o. male who has a wound over the metatarsal head on the medial aspect of his left great toe. He has undergone an arteriogram which shows no real options for revascularization. There is single vessel runoff on the left via the peroneal artery. His bypass graft on the right is patent. He comes in for a wound check.  He states that the wound does drain a small amount but this appears to have decreased. He denies fever. He has not had any significant pain in the foot except for the great toe.  REVIEW OF SYSTEMS: Arly.Keller ] denotes positive finding; [  ] denotes negative finding  CARDIOVASCULAR:  [ ]  chest pain   [ ]  dyspnea on exertion    CONSTITUTIONAL:  [ ]  fever   [ ]  chills  PHYSICAL EXAM: Filed Vitals:   11/03/12 1036  BP: 117/61  Pulse: 72  Height: 5\' 9"  (1.753 m)  Weight: 143 lb 3.2 oz (64.955 kg)  SpO2: 99%   Body mass index is 21.14 kg/(m^2). GENERAL: The patient is a well-nourished male, in no acute distress. The vital signs are documented above. CARDIOVASCULAR: There is a regular rate and rhythm  PULMONARY: There is good air exchange bilaterally without wheezing or rales. The wound on the medial aspect of his left foot is dry without drainage or erythema. Her is no drainage from under the toenail on the great toe.  I have independently interpreted his arterial Doppler study today which shows a biphasic peroneal signal on the right with a patent right femoropopliteal bypass graft. He has monophasic Doppler signals in the left foot. ABIs cannot be obtained because the arteries are calcified.  MEDICAL ISSUES: Given that there are no options for revascularization, will continue with conservative treatment. I am reluctant to proceed with Ray amputationof the left great toe given that this  could create a wound which would be at risk for not healing because of his infrainguinal arterial occlusive disease. I've instructed him to soak his foot daily and lukewarm valve soap soaks. I'll see him back in 3 months. He knows to call sooner if he has problems.  Johngabriel Verde S [facility] [place] Beeper: [phone]

## 2012-11-18 ENCOUNTER — Ambulatory Visit (HOSPITAL_BASED_OUTPATIENT_CLINIC_OR_DEPARTMENT_OTHER): Payer: Medicare Other

## 2012-11-18 ENCOUNTER — Other Ambulatory Visit (HOSPITAL_BASED_OUTPATIENT_CLINIC_OR_DEPARTMENT_OTHER): Payer: Medicare Other | Admitting: Lab

## 2012-11-18 VITALS — BP 113/45 | HR 70 | Temp 98.2°F

## 2012-11-18 DIAGNOSIS — M069 Rheumatoid arthritis, unspecified: Secondary | ICD-10-CM

## 2012-11-18 DIAGNOSIS — D649 Anemia, unspecified: Secondary | ICD-10-CM

## 2012-11-18 DIAGNOSIS — D638 Anemia in other chronic diseases classified elsewhere: Secondary | ICD-10-CM

## 2012-11-18 DIAGNOSIS — C3490 Malignant neoplasm of unspecified part of unspecified bronchus or lung: Secondary | ICD-10-CM

## 2012-11-18 LAB — CBC WITH DIFFERENTIAL/PLATELET
BASO%: 2.7 % — ABNORMAL HIGH (ref 0.0–2.0)
Basophils Absolute: 0.2 10*3/uL — ABNORMAL HIGH (ref 0.0–0.1)
EOS%: 3.8 % (ref 0.0–7.0)
HCT: 26.2 % — ABNORMAL LOW (ref 38.4–49.9)
HGB: 8.8 g/dL — ABNORMAL LOW (ref 13.0–17.1)
LYMPH%: 22.9 % (ref 14.0–49.0)
MCH: 28.9 pg (ref 27.2–33.4)
MCHC: 33.6 g/dL (ref 32.0–36.0)
NEUT%: 52.9 % (ref 39.0–75.0)
Platelets: 366 10*3/uL (ref 140–400)

## 2012-11-18 MED ORDER — DARBEPOETIN ALFA-POLYSORBATE 300 MCG/0.6ML IJ SOLN
300.0000 ug | INTRAMUSCULAR | Status: DC
  Administered 2012-11-18: 300 ug via SUBCUTANEOUS
  Filled 2012-11-18: qty 0.6

## 2012-11-21 ENCOUNTER — Other Ambulatory Visit: Payer: Self-pay | Admitting: Internal Medicine

## 2012-11-22 ENCOUNTER — Ambulatory Visit (INDEPENDENT_AMBULATORY_CARE_PROVIDER_SITE_OTHER): Payer: Medicare Other | Admitting: *Deleted

## 2012-11-22 DIAGNOSIS — I4891 Unspecified atrial fibrillation: Secondary | ICD-10-CM

## 2012-11-22 DIAGNOSIS — Z8679 Personal history of other diseases of the circulatory system: Secondary | ICD-10-CM

## 2012-12-14 ENCOUNTER — Other Ambulatory Visit: Payer: Medicare Other | Admitting: Lab

## 2012-12-14 ENCOUNTER — Ambulatory Visit: Payer: Medicare Other | Admitting: Oncology

## 2012-12-16 ENCOUNTER — Telehealth: Payer: Self-pay | Admitting: Oncology

## 2012-12-16 ENCOUNTER — Ambulatory Visit (HOSPITAL_BASED_OUTPATIENT_CLINIC_OR_DEPARTMENT_OTHER): Payer: Medicare Other | Admitting: Oncology

## 2012-12-16 ENCOUNTER — Other Ambulatory Visit (HOSPITAL_BASED_OUTPATIENT_CLINIC_OR_DEPARTMENT_OTHER): Payer: Medicare Other | Admitting: Lab

## 2012-12-16 ENCOUNTER — Ambulatory Visit: Payer: Medicare Other

## 2012-12-16 ENCOUNTER — Other Ambulatory Visit: Payer: Medicare Other | Admitting: Lab

## 2012-12-16 VITALS — BP 120/58 | HR 66 | Temp 97.8°F | Resp 18 | Ht 69.0 in | Wt 149.4 lb

## 2012-12-16 DIAGNOSIS — Z85118 Personal history of other malignant neoplasm of bronchus and lung: Secondary | ICD-10-CM

## 2012-12-16 DIAGNOSIS — Z8546 Personal history of malignant neoplasm of prostate: Secondary | ICD-10-CM

## 2012-12-16 DIAGNOSIS — D649 Anemia, unspecified: Secondary | ICD-10-CM

## 2012-12-16 DIAGNOSIS — C3490 Malignant neoplasm of unspecified part of unspecified bronchus or lung: Secondary | ICD-10-CM

## 2012-12-16 LAB — COMPREHENSIVE METABOLIC PANEL (CC13)
AST: 13 U/L (ref 5–34)
Alkaline Phosphatase: 65 U/L (ref 40–150)
BUN: 22 mg/dL (ref 7.0–26.0)
Glucose: 93 mg/dl (ref 70–140)
Potassium: 3.8 mEq/L (ref 3.5–5.1)
Total Bilirubin: 0.76 mg/dL (ref 0.20–1.20)

## 2012-12-16 LAB — CBC WITH DIFFERENTIAL/PLATELET
Basophils Absolute: 0.2 10*3/uL — ABNORMAL HIGH (ref 0.0–0.1)
EOS%: 3 % (ref 0.0–7.0)
Eosinophils Absolute: 0.2 10*3/uL (ref 0.0–0.5)
HGB: 10.3 g/dL — ABNORMAL LOW (ref 13.0–17.1)
LYMPH%: 16.8 % (ref 14.0–49.0)
MCH: 28.4 pg (ref 27.2–33.4)
MCV: 89.8 fL (ref 79.3–98.0)
MONO%: 17.7 % — ABNORMAL HIGH (ref 0.0–14.0)
NEUT#: 3.8 10*3/uL (ref 1.5–6.5)
Platelets: 445 10*3/uL — ABNORMAL HIGH (ref 140–400)
RBC: 3.64 10*6/uL — ABNORMAL LOW (ref 4.20–5.82)
RDW: 18.6 % — ABNORMAL HIGH (ref 11.0–14.6)

## 2012-12-16 MED ORDER — DARBEPOETIN ALFA-POLYSORBATE 300 MCG/0.6ML IJ SOLN: 300.0000 ug | INTRAMUSCULAR | Status: DC

## 2012-12-16 NOTE — Progress Notes (Signed)
Hematology and Oncology Follow Up Visit  [name] [id number] [birth year] 79 y.o. 12/16/2012 9:50 AM  CC: Valetta Mole. Cato Mulligan, MD  Ines Bloomer, M.D.  Di Kindle. [name], M.D.    Principle Diagnosis: This is a 79 year old gentleman with the following diagnoses: 1. 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 2. Multifactorial anemia probably due to chronic disease and rheumatoid arthritis. 3. History of a stage T1c Gleason score 8 adenocarcinoma of the prostate S/P external beam radiation under the care of Dr. Roselind Messier completed in 04/2011.  Current therapy:  1. He is on Aranesp at 300 mcg every 4 weeks to keep his hemoglobin above 10. 2. Watchful observation for his lung and prostate cancer.  Interim History:  This is a pleasant gentleman with the above history.  He presents today for a follow-up visit.  Since the last time I saw him, he had not reported any recent illnesses or hospitalization, had not had any weakness, fatigue.  He does report some occasional tiredness.  He has not reported any chest pain, has not reported any difficulty breathing, has not reported any cough or deterioration in his health. He continues to be on methotrexate on weekly bases for his rheumatoid arthritis.  No respiratory symptoms noted. No hemoptysis, hematemesis, hematuria, or melena. He reports no new illnesses or hospitalizations.  He still follows with Dr. Vernie Ammons for his prostate cancer.   Medications: I have reviewed the patient's current medications.  Current Outpatient Prescriptions  Medication Sig Dispense Refill  . acetaminophen (TYLENOL) 325 MG tablet Take 650 mg by mouth every 6 (six) hours as needed. For pain or fever      . alendronate (FOSAMAX) 70 MG tablet Take 70 mg by mouth every 7 (seven) days. On Sunday; Take with a full glass of water on an empty stomach.      Marland Kitchen aspirin EC 81 MG tablet Take 81 mg by mouth daily.       Marland Kitchen BACITRACIN ZINC EX Apply 1 application topically as needed.      . diltiazem (CARDIZEM CD) 120 MG 24 hr capsule TAKE ONE CAPSULE BY MOUTH EVERY DAY  90 capsule  3  . ferrous sulfate 325 (65 FE) MG tablet TAKE 1 TABLET BY MOUTH DAILY.  90 tablet  3  . finasteride (PROSCAR) 5 MG tablet TAKE 1 TABLET BY MOUTH EVERY DAY  90 tablet  3  . fluticasone (FLONASE) 50 MCG/ACT nasal spray Place 2 sprays into the nose daily as needed. For allergies  16 g  5  . folic acid (FOLVITE) 400 MCG tablet Take 400 mcg by mouth daily.       Marland Kitchen leflunomide (ARAVA) 20 MG tablet Take 20 mg by mouth daily.       Marland Kitchen lisinopril (PRINIVIL,ZESTRIL) 20 MG tablet TAKE 1 TABLET EVERY DAY BY MOUTH AS DIRECTED  90 tablet  4  . methotrexate (RHEUMATREX) 2.5 MG tablet Take 2.5 mg by mouth once a week. On Sunday Caution:Chemotherapy.      . metoprolol succinate (TOPROL-XL) 100 MG 24 hr tablet Take 100 mg by mouth daily. Take with or immediately following a meal.      . predniSONE (DELTASONE) 5 MG tablet Take 1 tablet by mouth daily.      . simvastatin (ZOCOR) 40 MG tablet Take 40 mg by mouth every evening.      . warfarin (COUMADIN) 5 MG tablet Take 2.5-5 mg by mouth  daily. 5mg  on Thursday; 2.5mg  the rest of the week       No current facility-administered medications for this visit.    Allergies: No Known Allergies  Past Medical History, Surgical history, Social history, and Family History were reviewed and updated.  Review of Systems: Constitutional:  Negative for fever, chills, night sweats, anorexia, weight loss, pain. Cardiovascular: no chest pain or dyspnea on exertion Respiratory: no cough, shortness of breath, or wheezing Neurological: no TIA or stroke symptoms Dermatological: negative ENT: negative Skin: Negative. Gastrointestinal: no abdominal pain, change in bowel habits, or black or bloody stools Genito-Urinary: no dysuria, trouble voiding, or hematuria Hematological and Lymphatic: negative Breast:  negative Musculoskeletal: negative Remaining ROS negative.  Physical Exam: Blood pressure 120/58, pulse 66, temperature 97.8 F (36.6 C), temperature source Oral, resp. rate 18, height 5\' 9"  (1.753 m), weight 149 lb 6.4 oz (67.767 kg). ECOG: 1 General appearance: alert Head: Normocephalic, without obvious abnormality, atraumatic Neck: no adenopathy, no carotid bruit, no JVD, supple, symmetrical, trachea midline and thyroid not enlarged, symmetric, no tenderness/mass/nodules Lymph nodes: Cervical, supraclavicular, and axillary nodes normal. Heart:regular rate and rhythm, S1, S2 normal, no murmur, click, rub or gallop Lung:chest clear, no wheezing, rales, normal symmetric air entry Abdomen: soft, non-tender, without masses or organomegaly EXT:no erythema, induration, or nodules  Lab Results: Lab Results  Component Value Date   WBC 6.4 12/16/2012   HGB 10.3* 12/16/2012   HCT 32.7* 12/16/2012   MCV 89.8 12/16/2012   PLT 445* 12/16/2012     Chemistry      Component Value Date/Time   NA 140 07/19/2012 0612   NA 144 06/11/2012 1102   NA 141 10/22/2011 0944   K 3.8 07/19/2012 0612   K 4.1 06/11/2012 1102   K 4.8* 10/22/2011 0944   CL 102 07/19/2012 0612   CL 111* 06/11/2012 1102   CL 98 10/22/2011 0944   CO2 26 06/11/2012 1102   CO2 26 10/22/2011 0944   CO2 25 07/03/2011 1323   BUN 15 07/19/2012 0612   BUN 23.0 06/11/2012 1102   BUN 29* 10/22/2011 0944   CREATININE 1.40* 07/19/2012 0612   CREATININE 1.2 06/11/2012 1102   CREATININE 1.5* 10/22/2011 0944      Component Value Date/Time   CALCIUM 9.5 06/11/2012 1102   CALCIUM 8.8 10/22/2011 0944   CALCIUM 9.1 07/03/2011 1323   CALCIUM 9.4 10/14/2007 0110   ALKPHOS 108 06/11/2012 1102   ALKPHOS 76 10/22/2011 0944   ALKPHOS 68 07/03/2011 1323   AST 21 06/11/2012 1102   AST 23 10/22/2011 0944   AST 19 07/03/2011 1323   ALT 14 06/11/2012 1102   ALT 14 07/03/2011 1323   BILITOT 0.70 06/11/2012 1102   BILITOT 1.10 10/22/2011 0944   BILITOT 0.8 07/03/2011 1323       Impression and Plan: .  A 79 year old gentleman with the following issues: 1. Stage IB lung cancer status post surgical resection.  CT scan from 06/2012 continues to have no evidence of recurrent disease.  Continue on active surveillance. He will have another CT scan as needed. I will repeat a CXR instead in 12/2013.  2. Anemia, multifactorial.  Continue to receive Aranesp to keep his hemoglobin above 10.  Aranesp will be 300 mcg every 8 weeks instead of 4 weeks given the stability of his Hgb. Aranesp injection was held today due to Hgb of 10.3. 3. T1c Gleason score 8 adenocarcinoma of the prostate with a PSA of 14.2.  He is S/P  radiation therapy without evidence of relapse. PSA is followed by Urology. 4. Follow-up. In 6 months.  Jessenia Filippone                  7/10/20149:50 AM

## 2012-12-16 NOTE — Telephone Encounter (Signed)
gv and printed appt sched and avs for pt...per Dr. Clelia Croft inj is now every two mths....d/t provided per pof.Marland KitchenMarland Kitchen

## 2012-12-24 ENCOUNTER — Other Ambulatory Visit: Payer: Self-pay | Admitting: Internal Medicine

## 2013-01-03 ENCOUNTER — Ambulatory Visit (INDEPENDENT_AMBULATORY_CARE_PROVIDER_SITE_OTHER): Payer: Medicare Other | Admitting: Pharmacist

## 2013-01-03 DIAGNOSIS — Z8679 Personal history of other diseases of the circulatory system: Secondary | ICD-10-CM

## 2013-01-03 DIAGNOSIS — I4891 Unspecified atrial fibrillation: Secondary | ICD-10-CM

## 2013-01-13 ENCOUNTER — Ambulatory Visit: Payer: Medicare Other

## 2013-01-15 ENCOUNTER — Other Ambulatory Visit: Payer: Self-pay | Admitting: Cardiovascular Disease

## 2013-01-31 ENCOUNTER — Encounter (INDEPENDENT_AMBULATORY_CARE_PROVIDER_SITE_OTHER): Payer: Medicare Other

## 2013-01-31 ENCOUNTER — Ambulatory Visit (INDEPENDENT_AMBULATORY_CARE_PROVIDER_SITE_OTHER): Payer: Medicare Other | Admitting: *Deleted

## 2013-01-31 DIAGNOSIS — I6529 Occlusion and stenosis of unspecified carotid artery: Secondary | ICD-10-CM

## 2013-01-31 DIAGNOSIS — I4891 Unspecified atrial fibrillation: Secondary | ICD-10-CM

## 2013-01-31 DIAGNOSIS — I2510 Atherosclerotic heart disease of native coronary artery without angina pectoris: Secondary | ICD-10-CM

## 2013-01-31 DIAGNOSIS — Z8679 Personal history of other diseases of the circulatory system: Secondary | ICD-10-CM

## 2013-02-01 ENCOUNTER — Encounter: Payer: Self-pay | Admitting: Vascular Surgery

## 2013-02-02 ENCOUNTER — Encounter: Payer: Self-pay | Admitting: Vascular Surgery

## 2013-02-02 ENCOUNTER — Ambulatory Visit (INDEPENDENT_AMBULATORY_CARE_PROVIDER_SITE_OTHER): Payer: Medicare Other | Admitting: Vascular Surgery

## 2013-02-02 VITALS — BP 131/68 | HR 72 | Resp 16 | Ht 69.0 in | Wt 149.0 lb

## 2013-02-02 DIAGNOSIS — I739 Peripheral vascular disease, unspecified: Secondary | ICD-10-CM

## 2013-02-02 DIAGNOSIS — M79609 Pain in unspecified limb: Secondary | ICD-10-CM

## 2013-02-02 NOTE — Progress Notes (Signed)
[facility] [place]  Patient name: [name] MRN: [id number] DOB: 16-Apr-1932 Sex: male  REASON FOR VISIT: follow up of left foot.  HPI: [name] is a 79 y.o. male who I had last seen on 11/03/2012. He had a wound over his metatarsal head on the medial aspect of his left great toe. He had undergone an arteriogram which showed no options for revascularization. He had single vessel runoff via the peroneal artery. As there were no real options for revascularization, I was reluctant to consider range amputation of the great toe because of the risk of nonhealing. As the wound appeared to be gradually improving we were treating this conservatively with lukewarm dial soap soaks and local wound care. He comes in for a 3 month follow up visit.  He states that the wound had been improving until approximately 2 weeks ago when he bumped it. He then developed some drainage but this has dried up some. He denies fever. He denies erythema.   Past Medical History  Diagnosis Date  . CARCINOMA, LUNG, SQUAMOUS CELL 08/21/2008  . HYPERLIPIDEMIA 11/13/2006  . ANEMIA 09/07/2009  . MYOCARDIAL INFARCTION, HX OF 11/13/2006  . CORONARY ARTERY DISEASE 11/13/2006  . Atrial fibrillation 11/13/2006  . CONGESTIVE HEART FAILURE 02/01/2007  . CAROTID ARTERY STENOSIS, WITHOUT INFARCTION 09/07/2008  . KIDNEY DISEASE, CHRONIC, STAGE II 10/13/2007  . Rheumatoid arthritis(714.0) 11/13/2006  . OSTEOPENIA 11/13/2006  . Cancer of prostate 10/24/2009  . TRANSIENT ISCHEMIC ATTACK, HX OF 11/13/2006  . COLONIC POLYPS, HX OF 07/04/2003    6 mm adenoma  . Femoral artery occlusion     right  . PAD (peripheral artery disease)   . Stroke 2008    Family History  Problem Relation Age of Onset  . Tuberculosis Mother   . Cancer Sister     Breast     SOCIAL HISTORY: History  Substance Use Topics  . Smoking status: Former Smoker    Quit date: 06/09/2001  . Smokeless tobacco: Never Used  . Alcohol Use: No    No  Known Allergies  Current Outpatient Prescriptions  Medication Sig Dispense Refill  . acetaminophen (TYLENOL) 325 MG tablet Take 650 mg by mouth every 6 (six) hours as needed. For pain or fever      . alendronate (FOSAMAX) 70 MG tablet Take 70 mg by mouth every 7 (seven) days. On Sunday; Take with a full glass of water on an empty stomach.      Marland Kitchen aspirin EC 81 MG tablet Take 81 mg by mouth daily.      Marland Kitchen BACITRACIN ZINC EX Apply 1 application topically as needed.      . diltiazem (CARDIZEM CD) 120 MG 24 hr capsule TAKE ONE CAPSULE BY MOUTH EVERY DAY  90 capsule  3  . ferrous sulfate 325 (65 FE) MG tablet TAKE 1 TABLET BY MOUTH DAILY.  90 tablet  3  . finasteride (PROSCAR) 5 MG tablet TAKE 1 TABLET BY MOUTH EVERY DAY  90 tablet  3  . fluticasone (FLONASE) 50 MCG/ACT nasal spray Place 2 sprays into the nose daily as needed. For allergies  16 g  5  . folic acid (FOLVITE) 400 MCG tablet Take 400 mcg by mouth daily.       Marland Kitchen leflunomide (ARAVA) 20 MG tablet Take 20 mg by mouth daily.       Marland Kitchen lisinopril (PRINIVIL,ZESTRIL) 20 MG tablet TAKE 1 TABLET EVERY DAY BY MOUTH AS DIRECTED  90 tablet  4  . methotrexate (RHEUMATREX) 2.5 MG tablet Take 2.5 mg by mouth once a week. On Sunday Caution:Chemotherapy.      . metoprolol succinate (TOPROL-XL) 100 MG 24 hr tablet TAKE 1 TABLET BY MOUTH EVERY DAY  90 tablet  1  . predniSONE (DELTASONE) 5 MG tablet Take 1 tablet by mouth daily.      . predniSONE (STERAPRED UNI-PAK) 5 MG TABS tablet daily.      . simvastatin (ZOCOR) 40 MG tablet TAKE 1 TABLET BY MOUTH AT BEDTIME  90 tablet  4  . warfarin (COUMADIN) 5 MG tablet TAKE 1 TABLET BY MOUTH AS DIRECTED BY COUMADIN CLINIC  30 tablet  3   No current facility-administered medications for this visit.    REVIEW OF SYSTEMS: Arly.Keller ] denotes positive finding; [  ] denotes negative finding  CARDIOVASCULAR:  [ ]  chest pain   [ ]  chest pressure   [ ]  palpitations   [ ]  orthopnea   [ ]  dyspnea on exertion   [ ]  claudication   [  ] rest pain   [ ]  DVT   [ ]  phlebitis PULMONARY:   [ ]  productive cough   [ ]  asthma   [ ]  wheezing INTEGUMENTARY:  [ ]  rashes  Arly.Keller ] ulcers- left great toe CONSTITUTIONAL:  [ ]  fever   [ ]  chills  PHYSICAL EXAM: Filed Vitals:   02/02/13 1630  BP: 131/68  Pulse: 72  Resp: 16  Height: 5\' 9"  (1.753 m)  Weight: 149 lb (67.586 kg)  SpO2: 97%   Body mass index is 21.99 kg/(m^2). GENERAL: The patient is a well-nourished male, in no acute distress. The vital signs are documented above. CARDIOVASCULAR: There is a regular rate and rhythm. He has a diminished left femoral pulse. PULMONARY: There is good air exchange bilaterally without wheezing or rales. SKIN: there is a small open wound over the medial aspect of his left great toe at the metatarsal head. There is a small amount of serous drainage but did not appear pergola. There is no erythema PSYCHIATRIC: The patient has a normal affect.  DATA:  I have reviewed his previous arteriogram. He does have diffuse calcific disease. There does appear to be some stenosis in the common femoral artery which is really the only thing I think could be addressed to potentially improve the circulation to the left foot although I did not think this would have a big impact. He has single vessel runoff on the left via the peroneal artery.  MEDICAL ISSUES: If this wound on the left toe progresses then he would require ray amputation of the left great toe in may not have adequate circulation to heal this. This could clearly become a limb threatening problem. I do not think there are really any good options for revascularization which would significantly impacted distal perfusion to the left foot. However it if he did require toe amputation and this was not healing adequately we could address the common femoral artery stenosis as a last ditch effort. We discussed proceeding with toe amputation at this point however he would prefer to continue to follow this for now and  therefore we'll plan on seeing him back in 3 months. He knows to call sooner if he has problems. Fortunately he is not a smoker.  Dermot Gremillion S [facility] [place] [facility]: [phone]

## 2013-02-10 ENCOUNTER — Ambulatory Visit: Payer: Medicare Other

## 2013-02-16 ENCOUNTER — Other Ambulatory Visit (HOSPITAL_BASED_OUTPATIENT_CLINIC_OR_DEPARTMENT_OTHER): Payer: Medicare Other | Admitting: Lab

## 2013-02-16 ENCOUNTER — Ambulatory Visit: Payer: Medicare Other

## 2013-02-16 DIAGNOSIS — D649 Anemia, unspecified: Secondary | ICD-10-CM

## 2013-02-16 DIAGNOSIS — C3490 Malignant neoplasm of unspecified part of unspecified bronchus or lung: Secondary | ICD-10-CM

## 2013-02-16 DIAGNOSIS — Z85118 Personal history of other malignant neoplasm of bronchus and lung: Secondary | ICD-10-CM

## 2013-02-16 LAB — CBC WITH DIFFERENTIAL/PLATELET
BASO%: 2.8 % — ABNORMAL HIGH (ref 0.0–2.0)
Basophils Absolute: 0.2 10*3/uL — ABNORMAL HIGH (ref 0.0–0.1)
HCT: 30.1 % — ABNORMAL LOW (ref 38.4–49.9)
LYMPH%: 21.1 % (ref 14.0–49.0)
MCHC: 33 g/dL (ref 32.0–36.0)
MONO#: 0.9 10*3/uL (ref 0.1–0.9)
NEUT%: 59.1 % (ref 39.0–75.0)
Platelets: 383 10*3/uL (ref 140–400)
WBC: 7.1 10*3/uL (ref 4.0–10.3)

## 2013-02-16 MED ORDER — DARBEPOETIN ALFA-POLYSORBATE 300 MCG/0.6ML IJ SOLN
300.0000 ug | INTRAMUSCULAR | Status: DC
  Filled 2013-02-16: qty 0.6

## 2013-02-28 ENCOUNTER — Ambulatory Visit (INDEPENDENT_AMBULATORY_CARE_PROVIDER_SITE_OTHER): Payer: Medicare Other | Admitting: *Deleted

## 2013-02-28 DIAGNOSIS — I4891 Unspecified atrial fibrillation: Secondary | ICD-10-CM

## 2013-02-28 DIAGNOSIS — Z8679 Personal history of other diseases of the circulatory system: Secondary | ICD-10-CM

## 2013-02-28 LAB — POCT INR: INR: 1.8

## 2013-03-14 ENCOUNTER — Ambulatory Visit (INDEPENDENT_AMBULATORY_CARE_PROVIDER_SITE_OTHER): Payer: Medicare Other | Admitting: *Deleted

## 2013-03-14 DIAGNOSIS — I4891 Unspecified atrial fibrillation: Secondary | ICD-10-CM

## 2013-03-14 DIAGNOSIS — Z8679 Personal history of other diseases of the circulatory system: Secondary | ICD-10-CM

## 2013-03-18 ENCOUNTER — Encounter: Payer: Self-pay | Admitting: Internal Medicine

## 2013-03-18 ENCOUNTER — Ambulatory Visit: Payer: Medicare Other | Admitting: Internal Medicine

## 2013-03-18 ENCOUNTER — Ambulatory Visit (INDEPENDENT_AMBULATORY_CARE_PROVIDER_SITE_OTHER): Payer: Medicare Other | Admitting: Internal Medicine

## 2013-03-18 ENCOUNTER — Ambulatory Visit (INDEPENDENT_AMBULATORY_CARE_PROVIDER_SITE_OTHER)
Admission: RE | Admit: 2013-03-18 | Discharge: 2013-03-18 | Disposition: A | Discharge status: Home / Self Care | Payer: Medicare Other | Source: Ambulatory Visit | Attending: Internal Medicine | Admitting: Internal Medicine

## 2013-03-18 VITALS — BP 110/58 | HR 68 | Temp 98.0°F | Wt 150.0 lb

## 2013-03-18 DIAGNOSIS — R0609 Other forms of dyspnea: Secondary | ICD-10-CM

## 2013-03-18 DIAGNOSIS — C3490 Malignant neoplasm of unspecified part of unspecified bronchus or lung: Secondary | ICD-10-CM

## 2013-03-18 DIAGNOSIS — I509 Heart failure, unspecified: Secondary | ICD-10-CM

## 2013-03-18 DIAGNOSIS — R0600 Dyspnea, unspecified: Secondary | ICD-10-CM

## 2013-03-18 DIAGNOSIS — C61 Malignant neoplasm of prostate: Secondary | ICD-10-CM

## 2013-03-18 DIAGNOSIS — Z23 Encounter for immunization: Secondary | ICD-10-CM

## 2013-03-18 DIAGNOSIS — I4891 Unspecified atrial fibrillation: Secondary | ICD-10-CM

## 2013-03-18 LAB — CBC WITH DIFFERENTIAL/PLATELET
Basophils Relative: 1.4 % (ref 0.0–3.0)
Eosinophils Absolute: 0.1 10*3/uL (ref 0.0–0.7)
Eosinophils Relative: 1.8 % (ref 0.0–5.0)
HCT: 28.1 % — ABNORMAL LOW (ref 39.0–52.0)
Lymphocytes Relative: 22 % (ref 12.0–46.0)
Lymphs Abs: 1.6 10*3/uL (ref 0.7–4.0)
MCHC: 31.9 g/dL (ref 30.0–36.0)
MCV: 87.6 fl (ref 78.0–100.0)
Monocytes Absolute: 0.9 10*3/uL (ref 0.1–1.0)
Neutro Abs: 4.6 10*3/uL (ref 1.4–7.7)
Neutrophils Relative %: 62.6 % (ref 43.0–77.0)
RBC: 3.2 Mil/uL — ABNORMAL LOW (ref 4.22–5.81)
WBC: 7.3 10*3/uL (ref 4.5–10.5)

## 2013-03-18 NOTE — Assessment & Plan Note (Signed)
No bleeding complications 

## 2013-03-18 NOTE — Assessment & Plan Note (Signed)
Has regular f/u with urology 

## 2013-03-18 NOTE — Assessment & Plan Note (Signed)
No sxs- Continue same meds Reviewed echo 2006

## 2013-03-18 NOTE — Progress Notes (Signed)
Cad-- no cp, pnd but he does have some DOE (one flight of stairs)  Lung ca- has regular f/u with oncology  Anemia- thought secondary to RA and chronic dzs (aranesp to keep hgb > 10)  Prostate CA- s/p external beam radiation  He has a wound on left foot--   Reviewed meds, pmh, psh, sochx  Ros- no specific complaints in a complete ros  Reviewed vitals thin male in no acute distress. HEENT exam atraumatic, normocephalic, neck supple without jugular venous distention. Chest clear to auscultation cardiac exam S1-S2 are irregular. Abdominal exam  with bowel sounds, soft and nontender. Extremities no edema. Neurologic exam is alert with a normal gait.  Reviewed labs from July and September 2014  Dyspnea-- ? Cause--  Check ekg, cxr, cbc, bnp

## 2013-03-18 NOTE — Assessment & Plan Note (Signed)
Reviewed oncology note

## 2013-03-23 ENCOUNTER — Other Ambulatory Visit: Payer: Self-pay | Admitting: Internal Medicine

## 2013-03-23 DIAGNOSIS — R0602 Shortness of breath: Secondary | ICD-10-CM

## 2013-03-23 DIAGNOSIS — R7989 Other specified abnormal findings of blood chemistry: Secondary | ICD-10-CM

## 2013-03-23 MED ORDER — FUROSEMIDE 20 MG PO TABS: 20.0000 mg | ORAL_TABLET | Freq: Every day | ORAL | Status: DC

## 2013-03-28 ENCOUNTER — Ambulatory Visit (INDEPENDENT_AMBULATORY_CARE_PROVIDER_SITE_OTHER): Payer: Medicare Other | Admitting: Pharmacist

## 2013-03-28 DIAGNOSIS — I4891 Unspecified atrial fibrillation: Secondary | ICD-10-CM

## 2013-03-28 DIAGNOSIS — Z8679 Personal history of other diseases of the circulatory system: Secondary | ICD-10-CM

## 2013-03-28 LAB — POCT INR: INR: 2.6

## 2013-04-06 ENCOUNTER — Other Ambulatory Visit (INDEPENDENT_AMBULATORY_CARE_PROVIDER_SITE_OTHER): Payer: Medicare Other

## 2013-04-06 DIAGNOSIS — E785 Hyperlipidemia, unspecified: Secondary | ICD-10-CM

## 2013-04-06 LAB — BASIC METABOLIC PANEL
BUN: 40 mg/dL — ABNORMAL HIGH (ref 6–23)
CO2: 26 mEq/L (ref 19–32)
Calcium: 9.5 mg/dL (ref 8.4–10.5)
Creatinine, Ser: 2.1 mg/dL — ABNORMAL HIGH (ref 0.4–1.5)
GFR: 40 mL/min — ABNORMAL LOW (ref >60.00)
Glucose, Bld: 111 mg/dL — ABNORMAL HIGH (ref 70–99)

## 2013-04-11 ENCOUNTER — Other Ambulatory Visit (INDEPENDENT_AMBULATORY_CARE_PROVIDER_SITE_OTHER): Payer: Medicare Other

## 2013-04-11 ENCOUNTER — Ambulatory Visit (INDEPENDENT_AMBULATORY_CARE_PROVIDER_SITE_OTHER): Payer: Medicare Other | Admitting: Pharmacist

## 2013-04-11 ENCOUNTER — Ambulatory Visit (HOSPITAL_COMMUNITY): Payer: Medicare Other | Attending: Internal Medicine | Admitting: Radiology

## 2013-04-11 DIAGNOSIS — I4891 Unspecified atrial fibrillation: Secondary | ICD-10-CM | POA: Insufficient documentation

## 2013-04-11 DIAGNOSIS — I059 Rheumatic mitral valve disease, unspecified: Secondary | ICD-10-CM | POA: Insufficient documentation

## 2013-04-11 DIAGNOSIS — I2510 Atherosclerotic heart disease of native coronary artery without angina pectoris: Secondary | ICD-10-CM | POA: Insufficient documentation

## 2013-04-11 DIAGNOSIS — R0602 Shortness of breath: Secondary | ICD-10-CM

## 2013-04-11 DIAGNOSIS — I509 Heart failure, unspecified: Secondary | ICD-10-CM | POA: Insufficient documentation

## 2013-04-11 DIAGNOSIS — E785 Hyperlipidemia, unspecified: Secondary | ICD-10-CM | POA: Insufficient documentation

## 2013-04-11 DIAGNOSIS — R7989 Other specified abnormal findings of blood chemistry: Secondary | ICD-10-CM

## 2013-04-11 DIAGNOSIS — R0609 Other forms of dyspnea: Secondary | ICD-10-CM | POA: Insufficient documentation

## 2013-04-11 DIAGNOSIS — Z8679 Personal history of other diseases of the circulatory system: Secondary | ICD-10-CM

## 2013-04-11 DIAGNOSIS — N189 Chronic kidney disease, unspecified: Secondary | ICD-10-CM | POA: Insufficient documentation

## 2013-04-11 DIAGNOSIS — Z87891 Personal history of nicotine dependence: Secondary | ICD-10-CM | POA: Insufficient documentation

## 2013-04-11 DIAGNOSIS — I10 Essential (primary) hypertension: Secondary | ICD-10-CM

## 2013-04-11 DIAGNOSIS — R0989 Other specified symptoms and signs involving the circulatory and respiratory systems: Secondary | ICD-10-CM | POA: Insufficient documentation

## 2013-04-11 DIAGNOSIS — D649 Anemia, unspecified: Secondary | ICD-10-CM

## 2013-04-11 DIAGNOSIS — I129 Hypertensive chronic kidney disease with stage 1 through stage 4 chronic kidney disease, or unspecified chronic kidney disease: Secondary | ICD-10-CM

## 2013-04-11 DIAGNOSIS — N182 Chronic kidney disease, stage 2 (mild): Secondary | ICD-10-CM

## 2013-04-11 DIAGNOSIS — Z8673 Personal history of transient ischemic attack (TIA), and cerebral infarction without residual deficits: Secondary | ICD-10-CM | POA: Insufficient documentation

## 2013-04-11 LAB — POCT INR: INR: 2.3

## 2013-04-11 LAB — CBC WITH DIFFERENTIAL/PLATELET
Basophils Absolute: 0.1 10*3/uL (ref 0.0–0.1)
Eosinophils Absolute: 0.1 10*3/uL (ref 0.0–0.7)
Eosinophils Relative: 1.7 % (ref 0.0–5.0)
Lymphocytes Relative: 17.7 % (ref 12.0–46.0)
Monocytes Relative: 13.4 % — ABNORMAL HIGH (ref 3.0–12.0)
Neutro Abs: 4.4 10*3/uL (ref 1.4–7.7)
Neutrophils Relative %: 65.8 % (ref 43.0–77.0)
Platelets: 452 10*3/uL — ABNORMAL HIGH (ref 150.0–400.0)
RDW: 17.3 % — ABNORMAL HIGH (ref 11.5–14.6)
WBC: 6.6 10*3/uL (ref 4.5–10.5)

## 2013-04-11 LAB — URINALYSIS, MICROSCOPIC ONLY: RBC / HPF: NONE SEEN (ref 0–?)

## 2013-04-11 LAB — BASIC METABOLIC PANEL
BUN: 49 mg/dL — ABNORMAL HIGH (ref 6–23)
Calcium: 9.6 mg/dL (ref 8.4–10.5)
Creatinine, Ser: 1.9 mg/dL — ABNORMAL HIGH (ref 0.4–1.5)
GFR: 44.16 mL/min — ABNORMAL LOW (ref >60.00)
Potassium: 5.5 mEq/L — ABNORMAL HIGH (ref 3.5–5.1)

## 2013-04-11 NOTE — Progress Notes (Signed)
Echocardiogram performed.  

## 2013-04-18 ENCOUNTER — Other Ambulatory Visit: Payer: Self-pay | Admitting: *Deleted

## 2013-04-19 ENCOUNTER — Other Ambulatory Visit (HOSPITAL_BASED_OUTPATIENT_CLINIC_OR_DEPARTMENT_OTHER): Payer: Medicare Other | Admitting: Lab

## 2013-04-19 ENCOUNTER — Ambulatory Visit (HOSPITAL_BASED_OUTPATIENT_CLINIC_OR_DEPARTMENT_OTHER): Payer: Medicare Other

## 2013-04-19 VITALS — BP 103/61 | HR 78 | Temp 97.6°F | Resp 18

## 2013-04-19 DIAGNOSIS — D649 Anemia, unspecified: Secondary | ICD-10-CM

## 2013-04-19 DIAGNOSIS — Z85118 Personal history of other malignant neoplasm of bronchus and lung: Secondary | ICD-10-CM

## 2013-04-19 DIAGNOSIS — C3490 Malignant neoplasm of unspecified part of unspecified bronchus or lung: Secondary | ICD-10-CM

## 2013-04-19 DIAGNOSIS — M069 Rheumatoid arthritis, unspecified: Secondary | ICD-10-CM

## 2013-04-19 LAB — CBC WITH DIFFERENTIAL/PLATELET
BASO%: 2.5 % — ABNORMAL HIGH (ref 0.0–2.0)
EOS%: 2.2 % (ref 0.0–7.0)
Eosinophils Absolute: 0.2 10*3/uL (ref 0.0–0.5)
HCT: 28.3 % — ABNORMAL LOW (ref 38.4–49.9)
HGB: 9 g/dL — ABNORMAL LOW (ref 13.0–17.1)
LYMPH%: 14.7 % (ref 14.0–49.0)
MCH: 28.2 pg (ref 27.2–33.4)
MCHC: 31.6 g/dL — ABNORMAL LOW (ref 32.0–36.0)
MCV: 89.1 fL (ref 79.3–98.0)
MONO#: 0.9 10*3/uL (ref 0.1–0.9)
NEUT%: 67.7 % (ref 39.0–75.0)
Platelets: 413 10*3/uL — ABNORMAL HIGH (ref 140–400)
RBC: 3.18 10*6/uL — ABNORMAL LOW (ref 4.20–5.82)
WBC: 7.2 10*3/uL (ref 4.0–10.3)
lymph#: 1.1 10*3/uL (ref 0.9–3.3)

## 2013-04-19 MED ORDER — DARBEPOETIN ALFA-POLYSORBATE 300 MCG/0.6ML IJ SOLN
300.0000 ug | INTRAMUSCULAR | Status: DC
  Administered 2013-04-19: 300 ug via SUBCUTANEOUS
  Filled 2013-04-19: qty 0.6

## 2013-04-29 ENCOUNTER — Other Ambulatory Visit (INDEPENDENT_AMBULATORY_CARE_PROVIDER_SITE_OTHER): Payer: Medicare Other

## 2013-04-29 DIAGNOSIS — N182 Chronic kidney disease, stage 2 (mild): Secondary | ICD-10-CM

## 2013-04-29 LAB — BASIC METABOLIC PANEL
CO2: 24 mEq/L (ref 19–32)
Calcium: 8.9 mg/dL (ref 8.4–10.5)
Creatinine, Ser: 1.8 mg/dL — ABNORMAL HIGH (ref 0.4–1.5)
Potassium: 4.8 mEq/L (ref 3.5–5.1)
Sodium: 135 mEq/L (ref 135–145)

## 2013-05-03 NOTE — Progress Notes (Signed)
Pt informed

## 2013-05-04 ENCOUNTER — Encounter: Payer: Self-pay | Admitting: Cardiovascular Disease

## 2013-05-04 ENCOUNTER — Ambulatory Visit (INDEPENDENT_AMBULATORY_CARE_PROVIDER_SITE_OTHER): Payer: Medicare Other | Admitting: *Deleted

## 2013-05-04 ENCOUNTER — Ambulatory Visit (INDEPENDENT_AMBULATORY_CARE_PROVIDER_SITE_OTHER): Payer: Medicare Other | Admitting: Cardiovascular Disease

## 2013-05-04 VITALS — BP 90/50 | HR 94 | Ht 69.0 in | Wt 146.0 lb

## 2013-05-04 DIAGNOSIS — R0602 Shortness of breath: Secondary | ICD-10-CM

## 2013-05-04 DIAGNOSIS — I4891 Unspecified atrial fibrillation: Secondary | ICD-10-CM

## 2013-05-04 DIAGNOSIS — I2510 Atherosclerotic heart disease of native coronary artery without angina pectoris: Secondary | ICD-10-CM

## 2013-05-04 DIAGNOSIS — Z8679 Personal history of other diseases of the circulatory system: Secondary | ICD-10-CM

## 2013-05-04 LAB — POCT INR: INR: 4

## 2013-05-04 MED ORDER — LISINOPRIL 10 MG PO TABS: ORAL_TABLET | ORAL | Status: DC

## 2013-05-04 NOTE — Patient Instructions (Signed)
Your physician has requested that you have a lexiscan myoview. For further information please visit [email or web address] Please follow instruction sheet, as given.  Your physician has recommended you make the following change in your medication: DECREASE Lisinopril to 10mg  take one by mouth daily  Your physician wants you to follow-up in: 1 YEAR with Dr Excell Seltzer.  You will receive a reminder letter in the mail two months in advance. If you don't receive a letter, please call our office to schedule the follow-up appointment.  Your physician has requested that you have a carotid duplex in August 2016. This test is an ultrasound of the carotid arteries in your neck. It looks at blood flow through these arteries that supply the brain with blood. Allow one hour for this exam. There are no restrictions or special instructions.

## 2013-05-04 NOTE — Progress Notes (Signed)
HPI:  79 year old gentleman presenting for followup evaluation. The patient is followed for coronary artery disease with history of CABG. He also has hypertension and paroxysmal atrial fibrillation maintained on chronic anticoagulation with warfarin. The patient has developed a nonhealing ulceration on the left foot and has been followed closely by Dr. [name] with vascular surgery. He does not have good revascularization options and he continues with conservative medical therapy. He also has a history of lung cancer with prior lobectomy and prostate cancer treated with external beam radiation.  Lipids were last checked one year ago with a cholesterol of 135, triglycerides 69, HDL 42, and LDL 79.  The patient is significantly limited by shortness of breath. He feels like his breathing has worsened over the past few months. He is dyspneic with low-level activity and he could not walk a city block. He denies orthopnea, PND, or leg swelling. He denies chest pain or pressure. He denies lightheadedness or syncope. He continues to have some problems with his foot and his walking is somewhat limited by this as well.  Outpatient Encounter Prescriptions as of 05/04/2013  Medication Sig  . acetaminophen (TYLENOL) 325 MG tablet Take 650 mg by mouth every 6 (six) hours as needed. For pain or fever  . alendronate (FOSAMAX) 70 MG tablet Take 70 mg by mouth every 7 (seven) days. On Sunday; Take with a full glass of water on an empty stomach.  Marland Kitchen aspirin EC 81 MG tablet Take 81 mg by mouth daily.  Marland Kitchen BACITRACIN ZINC EX Apply 1 application topically as needed.  . diltiazem (CARDIZEM CD) 120 MG 24 hr capsule TAKE ONE CAPSULE BY MOUTH EVERY DAY  . ferrous sulfate 325 (65 FE) MG tablet TAKE 1 TABLET BY MOUTH DAILY.  . finasteride (PROSCAR) 5 MG tablet TAKE 1 TABLET BY MOUTH EVERY DAY  . fluticasone (FLONASE) 50 MCG/ACT nasal spray Place 2 sprays into the nose daily as needed. For allergies  . folic acid (FOLVITE)  400 MCG tablet Take 400 mcg by mouth daily.   . furosemide (LASIX) 20 MG tablet Take 1 tablet (20 mg total) by mouth daily.  Marland Kitchen leflunomide (ARAVA) 20 MG tablet Take 20 mg by mouth daily.   Marland Kitchen lisinopril (PRINIVIL,ZESTRIL) 20 MG tablet TAKE 1 TABLET EVERY DAY BY MOUTH AS DIRECTED  . methotrexate (RHEUMATREX) 2.5 MG tablet Take 2.5 mg by mouth once a week. On Sunday Caution:Chemotherapy.  . metoprolol succinate (TOPROL-XL) 100 MG 24 hr tablet TAKE 1 TABLET BY MOUTH EVERY DAY  . predniSONE (DELTASONE) 5 MG tablet Take 1 tablet by mouth daily.  . simvastatin (ZOCOR) 40 MG tablet TAKE 1 TABLET BY MOUTH AT BEDTIME  . warfarin (COUMADIN) 5 MG tablet TAKE 1 TABLET BY MOUTH AS DIRECTED BY COUMADIN CLINIC    No Known Allergies  Past Medical History  Diagnosis Date  . CARCINOMA, LUNG, SQUAMOUS CELL 08/21/2008  . HYPERLIPIDEMIA 11/13/2006  . ANEMIA 09/07/2009  . MYOCARDIAL INFARCTION, HX OF 11/13/2006  . CORONARY ARTERY DISEASE 11/13/2006  . Atrial fibrillation 11/13/2006  . CONGESTIVE HEART FAILURE 02/01/2007  . CAROTID ARTERY STENOSIS, WITHOUT INFARCTION 09/07/2008  . KIDNEY DISEASE, CHRONIC, STAGE II 10/13/2007  . Rheumatoid arthritis(714.0) 11/13/2006  . OSTEOPENIA 11/13/2006  . Cancer of prostate 10/24/2009  . TRANSIENT ISCHEMIC ATTACK, HX OF 11/13/2006  . COLONIC POLYPS, HX OF 07/04/2003    6 mm adenoma  . Femoral artery occlusion     right  . PAD (peripheral artery disease)   . Stroke 2008  ROS: Negative except as per HPI  BP 90/50  Pulse 94  Ht 5\' 9"  (1.753 m)  Wt 146 lb (66.225 kg)  BMI 21.55 kg/m2  PHYSICAL EXAM: Pt is alert and oriented, pleasant elderly male in NAD HEENT: normal Neck: JVP - normal, carotids 2+= without bruits Lungs: Diminished bilaterally with prolonged expiratory phase CV: Irregularly irregular without murmur or gallop Abd: soft, NT, Positive BS, no hepatomegaly Ext: no C/C/E, pedal pulses nonpalpable Skin: warm/dry no rash  EKG:  Reviewed from 03/18/2013. This shows  atrial fibrillation with controlled ventricular response.  CXR 03/18/2013: IMPRESSION: Changes of COPD, mild chronic interstitial lung disease, and prior right thoracotomy.  No definite acute infiltrate.  Questionable vague nodular density left upper lobe ; CT chest recommended to exclude developing pulmonary nodule.  2-D echocardiogram 04/11/2013: Study Conclusions  - Left ventricle: The cavity size was normal. Wall thickness was normal. Systolic function was mildly to moderately reduced. The estimated ejection fraction was in the range of 40% to 45%. Diffuse hypokinesis. - Mitral valve: Calcified annulus. Mild regurgitation. - Left atrium: The atrium was mildly to moderately dilated. - Right atrium: The atrium was mildly to moderately dilated.   ASSESSMENT AND PLAN: 1. CAD status post CABG. No clear anginal symptoms. However, he does note progressive shortness of breath. His echocardiogram and chest x-ray were reviewed. He clearly has chronic lung disease from prolonged tobacco use. However, progression of his symptoms raises concern for cardiac ischemia. I have recommended a Lexiscan Myoview stress test for further evaluation. He will continue on his current medical program.  2. Atrial fibrillation. The patient previously had paroxysmal atrial fibrillation, and this is likely now persistent. He remains anticoagulated with warfarin. Continue rate control with diltiazem and metoprolol succinate.  3. Hypertension. Blood pressure is low. Will reduce lisinopril to 10 mg daily. Otherwise continue current therapy.  4. Lower extremity peripheral arterial disease with ulceration. He sees Dr. [name] in followup next week.  5. Carotid stenosis without history of stroke. Duplex ultrasound from 01/31/2013 shows 40-59% bilateral ICA stenosis. This will be followed up in a few years.  6. Abnormal chest x-ray. There is a question of a left upper lobe nodular density. The patient has been  followed with serial CAT scans of the chest after his lung cancer diagnosis and previous lobectomy. His last scan was January 2014. I will forward this to oncology, but I suspect he will have his annual CT scan next month.  Tonny Bollman 05/04/2013 9:18 AM

## 2013-05-10 ENCOUNTER — Encounter: Payer: Self-pay | Admitting: Vascular Surgery

## 2013-05-11 ENCOUNTER — Ambulatory Visit (INDEPENDENT_AMBULATORY_CARE_PROVIDER_SITE_OTHER): Payer: Medicare Other | Admitting: Vascular Surgery

## 2013-05-11 ENCOUNTER — Encounter: Payer: Self-pay | Admitting: Vascular Surgery

## 2013-05-11 VITALS — BP 94/55 | HR 122 | Resp 16 | Ht 69.0 in | Wt 144.0 lb

## 2013-05-11 DIAGNOSIS — I739 Peripheral vascular disease, unspecified: Secondary | ICD-10-CM

## 2013-05-11 NOTE — Progress Notes (Signed)
Patient name: [name] MRN: [id number] DOB: Mar 11, 1932 Sex: male  REASON FOR VISIT: follow up of left great toe wound.   HPI: [name] is a 79 y.o. male who I last saw on 02/02/2013. He had a wound over his metatarsal head on the medial aspect of his left great toe. He had undergone a previous arteriogram which showed no real options for revascularization. He had single vessel runoff via the peroneal artery. Since I saw him last he denies any significant claudication or rest pain in his lower extremities. The wound on the medial aspect of his left great toe has been persistent although he feels that it has improved somewhat. He denies any significant drainage from the wound. He denies fever or chills.  Of note he is followed by Dr. Excell Seltzer and is parent is scheduled for a stress test later this month. He has a history of coronary artery disease and has undergone previous CABG. He also has a history of lung cancer and is status post previous lobectomy. He's had prostate cancer treated with radiation therapy. He has had some shortness of breath which prompted the stress test.   REVIEW OF SYSTEMS: Arly.Keller ] denotes positive finding; [  ] denotes negative finding  CARDIOVASCULAR:  [ ]  chest pain   Arly.Keller ] dyspnea on exertion    CONSTITUTIONAL:  [ ]  fever   [ ]  chills  PHYSICAL EXAM: Filed Vitals:   05/11/13 1353  BP: 94/55  Pulse: 122  Resp: 16  Height: 5\' 9"  (1.753 m)  Weight: 144 lb (65.318 kg)   Body mass index is 21.26 kg/(m^2). GENERAL: The patient is a well-nourished male, in no acute distress. The vital signs are documented above. CARDIOVASCULAR: There is a regular rate and rhythm. He has a palpable right femoral pulse. I cannot palpate a left femoral pulse. I cannot palpate a popliteal or pedal pulses on the left. PULMONARY: There is good air exchange bilaterally without wheezing or rales. The wound on the medial aspect of his left great toe is not changed significantly. There is a  smaller wound on the medial aspect of his foot with no significant drainage.  I have reviewed his previous arteriogram from February of 2014. He has diffuse calcific disease of his aortoiliac system but no focal stenosis. He is single vessel runoff on the left via the peroneal artery.  MEDICAL ISSUES: PERIPHERAL VASCULAR DISEASE WITH ULCERATION: This patient has known diffuse atherosclerotic disease based on his previous arteriogram. The only potential option I saw previously for revascularization was to address a plaque in his left common femoral artery. However I did not think this was significantly impact his distal circulation. However I am no longer able to palpate a left femoral pulse is disease on the left may have progressed. I've discussed with him the option of proceeding with arteriography however he feels that the wound has improved recently. In addition he is scheduled for a stress test I. Dr. Excell Seltzer. Plan on seeing him back in 6 weeks to evaluate the foot again. It is been no improvement in his wound on the left foot I think we need to repeat his arteriography to see if his inflow disease on the left has progressed. I am concerned that he would not heal a primary ray amputation of the left great toe given his multilevel arterial occlusive disease and single vessel runoff the left the of the peroneal artery. See him back in 6 weeks. He knows to call sooner  she has problems. Fortunately he is not a smoker.  DICKSON,[name] S [facility] [place] [facility]: [phone]

## 2013-05-13 ENCOUNTER — Telehealth: Payer: Self-pay | Admitting: *Deleted

## 2013-05-13 NOTE — Telephone Encounter (Signed)
sw with pt informed him of his arrival time change for 06/17/13. gv appt for 06/17/13 w/labs@ 3pm , ov@ 3:30p, and inj @4pm ...td

## 2013-05-18 ENCOUNTER — Encounter: Payer: Self-pay | Admitting: Cardiovascular Disease

## 2013-05-19 ENCOUNTER — Ambulatory Visit (INDEPENDENT_AMBULATORY_CARE_PROVIDER_SITE_OTHER): Payer: Medicare Other | Admitting: Pharmacist

## 2013-05-19 ENCOUNTER — Encounter: Payer: Self-pay | Admitting: Cardiovascular Disease

## 2013-05-19 ENCOUNTER — Ambulatory Visit (HOSPITAL_COMMUNITY): Payer: Medicare Other | Attending: Cardiovascular Disease | Admitting: Radiology

## 2013-05-19 VITALS — BP 95/57 | HR 75 | Ht 69.0 in | Wt 145.0 lb

## 2013-05-19 DIAGNOSIS — I4892 Unspecified atrial flutter: Secondary | ICD-10-CM | POA: Insufficient documentation

## 2013-05-19 DIAGNOSIS — Z8679 Personal history of other diseases of the circulatory system: Secondary | ICD-10-CM

## 2013-05-19 DIAGNOSIS — J4489 Other specified chronic obstructive pulmonary disease: Secondary | ICD-10-CM | POA: Insufficient documentation

## 2013-05-19 DIAGNOSIS — E785 Hyperlipidemia, unspecified: Secondary | ICD-10-CM | POA: Insufficient documentation

## 2013-05-19 DIAGNOSIS — I4891 Unspecified atrial fibrillation: Secondary | ICD-10-CM

## 2013-05-19 DIAGNOSIS — R0609 Other forms of dyspnea: Secondary | ICD-10-CM | POA: Insufficient documentation

## 2013-05-19 DIAGNOSIS — I2510 Atherosclerotic heart disease of native coronary artery without angina pectoris: Secondary | ICD-10-CM | POA: Insufficient documentation

## 2013-05-19 DIAGNOSIS — I739 Peripheral vascular disease, unspecified: Secondary | ICD-10-CM | POA: Insufficient documentation

## 2013-05-19 DIAGNOSIS — Z87891 Personal history of nicotine dependence: Secondary | ICD-10-CM | POA: Insufficient documentation

## 2013-05-19 DIAGNOSIS — R0989 Other specified symptoms and signs involving the circulatory and respiratory systems: Secondary | ICD-10-CM | POA: Insufficient documentation

## 2013-05-19 DIAGNOSIS — I779 Disorder of arteries and arterioles, unspecified: Secondary | ICD-10-CM | POA: Insufficient documentation

## 2013-05-19 DIAGNOSIS — C3490 Malignant neoplasm of unspecified part of unspecified bronchus or lung: Secondary | ICD-10-CM | POA: Insufficient documentation

## 2013-05-19 DIAGNOSIS — Z8673 Personal history of transient ischemic attack (TIA), and cerebral infarction without residual deficits: Secondary | ICD-10-CM | POA: Insufficient documentation

## 2013-05-19 DIAGNOSIS — J449 Chronic obstructive pulmonary disease, unspecified: Secondary | ICD-10-CM | POA: Insufficient documentation

## 2013-05-19 DIAGNOSIS — I252 Old myocardial infarction: Secondary | ICD-10-CM | POA: Insufficient documentation

## 2013-05-19 DIAGNOSIS — R0602 Shortness of breath: Secondary | ICD-10-CM

## 2013-05-19 DIAGNOSIS — Z951 Presence of aortocoronary bypass graft: Secondary | ICD-10-CM | POA: Insufficient documentation

## 2013-05-19 LAB — POCT INR: INR: 4.4

## 2013-05-19 MED ORDER — AMINOPHYLLINE 25 MG/ML IV SOLN
75.0000 mg | Freq: Once | INTRAVENOUS | Status: AC
  Administered 2013-05-19: 75 mg via INTRAVENOUS

## 2013-05-19 MED ORDER — TECHNETIUM TC 99M SESTAMIBI GENERIC - CARDIOLITE
11.0000 | Freq: Once | INTRAVENOUS | Status: AC | PRN
  Administered 2013-05-19: 11 via INTRAVENOUS

## 2013-05-19 MED ORDER — TECHNETIUM TC 99M SESTAMIBI GENERIC - CARDIOLITE
33.0000 | Freq: Once | INTRAVENOUS | Status: AC | PRN
  Administered 2013-05-19: 33 via INTRAVENOUS

## 2013-05-19 MED ORDER — REGADENOSON 0.4 MG/5ML IV SOLN
0.4000 mg | Freq: Once | INTRAVENOUS | Status: AC
  Administered 2013-05-19: 0.4 mg via INTRAVENOUS

## 2013-05-19 NOTE — Progress Notes (Signed)
[facility] SITE 3 NUCLEAR MED [address] [place], [place] [address] [phone]    Cardiology Nuclear Med Study  [name] is a 79 y.o. male     MRN : [id number]     DOB: [birth year]  Procedure Date: 05/19/2013  Nuclear Med Background Indication for Stress Test:  Evaluation for Ischemia and Graft Patency History:  CAD, MI, CABG, Afib/flutter, Echo 2014 EF 40-45%, MPI 2009 (normal) EF 52%, COPD, Lung Ca/lobectomy Cardiac Risk Factors: Carotid Disease, CVA, History of Smoking, Lipids and PVD  Symptoms:  DOE and SOB   Nuclear Pre-Procedure Caffeine/Decaff Intake:  None NPO After: 10:00pm   Lungs:  clear O2 Sat: 97% on room air. IV 0.9% NS with Angio Cath:  20g  IV Site: L Antecubital  IV Started by:  Milana Na, EMT-P  Chest Size (in):  40 Cup Size: n/a  Height: 5\' 9"  (1.753 m)  Weight:  145 lb (65.772 kg)  BMI:  Body mass index is 21.4 kg/(m^2). Tech Comments: Rx this am    Nuclear Med Study 1 or 2 day study: 1 day  Stress Test Type:  Eugenie Birks  Reading MD: Yoko Mcgahee [place], MD  Order Authorizing Provider:  P.Shwanda Soltis MD  Resting Radionuclide: Technetium 23m Sestamibi  Resting Radionuclide Dose: 11.0 mCi   Stress Radionuclide:  Technetium 67m Sestamibi  Stress Radionuclide Dose: 33.0 mCi           Stress Protocol Rest HR: 75 Stress HR: 94  Rest BP: 95/57 Stress BP: 90/50  Exercise Time (min): n/a METS: n/a           Dose of Adenosine (mg):  n/a Dose of Lexiscan: 0.4 mg  Dose of Atropine (mg): n/a Dose of Dobutamine: n/a mcg/kg/min (at max HR)  Stress Test Technologist: Nelson Chimes, BS-ES  Nuclear Technologist:  Doyne Keel, CNMT     Rest Procedure:  Myocardial perfusion imaging was performed at rest 45 minutes following the intravenous administration of Technetium 34m Sestamibi. Rest ECG: NSR - Normal EKG  Stress Procedure:  The patient received IV Lexiscan 0.4 mg over 15-seconds.  Technetium 45m Sestamibi injected at 30-seconds.   Quantitative spect images were obtained after a 45 minute delay.  During the infusion of Lexiscan, the patient complained of stomach feeling woozy and gurgling.  These symptoms persisted and 25 mg/ml aminophylline was given by Bonnita Levan, RN. The patient began to feel much better within minutes.  Stress ECG: No significant change from baseline ECG  QPS Raw Data Images:  Normal; no motion artifact; normal heart/lung ratio. Stress Images:  There is decreased uptake in the lateral wall. Rest Images:  There is decreased uptake in the lateral wall. Subtraction (SDS):  There is a fixed defect that is most consistent with a previous infarction. Transient Ischemic Dilatation (Normal <1.22):  1.02 Lung/Heart Ratio (Normal <0.45):  0.34  Quantitative Gated Spect Images QGS EDV:  104 ml QGS ESV:  62 ml  Impression Exercise Capacity:  Lexiscan with no exercise. BP Response:  Normal blood pressure response. Clinical Symptoms:  Stomach Pain ECG Impression:  No significant ST segment change suggestive of ischemia. Comparison with Prior Nuclear Study: No images to compare  Overall Impression:  Intermediate risk stress nuclear study Inferolateral wall infarct from apex to base with no ischemia. Lateral wall dyskinesis on surface images with EF 40%.  LV Ejection Fraction: 40%.  LV Wall Motion:  Moderately decreased function with inferolateral wall dyskinesis   [name]

## 2013-05-27 ENCOUNTER — Ambulatory Visit (INDEPENDENT_AMBULATORY_CARE_PROVIDER_SITE_OTHER): Payer: Medicare Other

## 2013-05-27 DIAGNOSIS — I4891 Unspecified atrial fibrillation: Secondary | ICD-10-CM

## 2013-05-27 DIAGNOSIS — Z8679 Personal history of other diseases of the circulatory system: Secondary | ICD-10-CM

## 2013-05-27 LAB — POCT INR: INR: 2.3

## 2013-06-10 ENCOUNTER — Ambulatory Visit (INDEPENDENT_AMBULATORY_CARE_PROVIDER_SITE_OTHER): Payer: Medicare Other | Admitting: Pharmacist

## 2013-06-10 DIAGNOSIS — I4891 Unspecified atrial fibrillation: Secondary | ICD-10-CM

## 2013-06-10 DIAGNOSIS — Z8679 Personal history of other diseases of the circulatory system: Secondary | ICD-10-CM

## 2013-06-10 LAB — POCT INR: INR: 2.3

## 2013-06-17 ENCOUNTER — Other Ambulatory Visit (HOSPITAL_BASED_OUTPATIENT_CLINIC_OR_DEPARTMENT_OTHER): Payer: Medicare Other

## 2013-06-17 ENCOUNTER — Other Ambulatory Visit: Payer: Medicare Other | Admitting: Lab

## 2013-06-17 ENCOUNTER — Ambulatory Visit: Payer: Medicare Other | Admitting: Oncology

## 2013-06-17 ENCOUNTER — Telehealth: Payer: Self-pay | Admitting: Oncology

## 2013-06-17 ENCOUNTER — Ambulatory Visit (HOSPITAL_BASED_OUTPATIENT_CLINIC_OR_DEPARTMENT_OTHER): Payer: Medicare Other

## 2013-06-17 ENCOUNTER — Other Ambulatory Visit: Payer: Self-pay | Admitting: Internal Medicine

## 2013-06-17 ENCOUNTER — Ambulatory Visit (HOSPITAL_BASED_OUTPATIENT_CLINIC_OR_DEPARTMENT_OTHER): Payer: Medicare Other | Admitting: Oncology

## 2013-06-17 ENCOUNTER — Encounter: Payer: Self-pay | Admitting: Oncology

## 2013-06-17 ENCOUNTER — Ambulatory Visit: Payer: Medicare Other

## 2013-06-17 VITALS — BP 114/72 | HR 84 | Temp 97.8°F | Resp 18 | Ht 69.0 in | Wt 149.4 lb

## 2013-06-17 DIAGNOSIS — C3490 Malignant neoplasm of unspecified part of unspecified bronchus or lung: Secondary | ICD-10-CM

## 2013-06-17 DIAGNOSIS — D649 Anemia, unspecified: Secondary | ICD-10-CM

## 2013-06-17 DIAGNOSIS — C3410 Malignant neoplasm of upper lobe, unspecified bronchus or lung: Secondary | ICD-10-CM

## 2013-06-17 LAB — CBC WITH DIFFERENTIAL/PLATELET
BASO%: 0.3 % (ref 0.0–2.0)
BASOS ABS: 0 10*3/uL (ref 0.0–0.1)
EOS%: 1.3 % (ref 0.0–7.0)
Eosinophils Absolute: 0.1 10*3/uL (ref 0.0–0.5)
HEMATOCRIT: 30.2 % — AB (ref 38.4–49.9)
HEMOGLOBIN: 9.7 g/dL — AB (ref 13.0–17.1)
LYMPH#: 0.7 10*3/uL — AB (ref 0.9–3.3)
LYMPH%: 7.3 % — ABNORMAL LOW (ref 14.0–49.0)
MCH: 29 pg (ref 27.2–33.4)
MCHC: 32.1 g/dL (ref 32.0–36.0)
MCV: 90.4 fL (ref 79.3–98.0)
MONO#: 1 10*3/uL — AB (ref 0.1–0.9)
MONO%: 11.3 % (ref 0.0–14.0)
NEUT%: 79.8 % — ABNORMAL HIGH (ref 39.0–75.0)
NEUTROS ABS: 7.2 10*3/uL — AB (ref 1.5–6.5)
Platelets: 422 10*3/uL — ABNORMAL HIGH (ref 140–400)
RBC: 3.34 10*6/uL — ABNORMAL LOW (ref 4.20–5.82)
RDW: 16.1 % — ABNORMAL HIGH (ref 11.0–14.6)
WBC: 9.1 10*3/uL (ref 4.0–10.3)

## 2013-06-17 MED ORDER — DARBEPOETIN ALFA-POLYSORBATE 300 MCG/0.6ML IJ SOLN
300.0000 ug | INTRAMUSCULAR | Status: DC
  Filled 2013-06-17: qty 0.6

## 2013-06-17 MED ORDER — DARBEPOETIN ALFA-POLYSORBATE 300 MCG/0.6ML IJ SOLN
300.0000 ug | INTRAMUSCULAR | Status: DC
  Administered 2013-06-17: 300 ug via SUBCUTANEOUS
  Filled 2013-06-17: qty 0.6

## 2013-06-17 NOTE — Telephone Encounter (Signed)
gv adn printed appt sched and avs for pt for March 2015

## 2013-06-17 NOTE — Progress Notes (Signed)
Hematology and Oncology Follow Up Visit  [name] [id number] 1931-11-27 80 y.o. 06/17/2013 3:41 PM  CC: Darrick Penna. Leanne Chang, MD  Nicanor Alcon, M.D.  Judeth Cornfield. Scot Dock, M.D.    Principle Diagnosis: This is a 80 year old gentleman with the following diagnoses: 1. 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 2. Multifactorial anemia probably due to chronic disease and rheumatoid arthritis. 3. History of a stage T1c Gleason score 8 adenocarcinoma of the prostate S/P external beam radiation under the care of Dr. Sondra Come completed in 04/2011.  Current therapy:  1. He is on Aranesp at 300 mcg every 4 weeks to keep his hemoglobin above 10. 2. Watchful observation for his lung and prostate cancer.  Interim History:  This is a pleasant gentleman with the above history.  He presents today for a follow-up visit.  Since the last time he was here, he had not reported any recent illnesses or hospitalization, had not had any weakness, fatigue.  He does report some occasional tiredness.  He has not reported any chest pain, has not reported any difficulty breathing, has not reported any cough or deterioration in his health. He continues to be on methotrexate on weekly bases for his rheumatoid arthritis.  No respiratory symptoms noted. No hemoptysis, hematemesis, hematuria, or melena. He reports no new illnesses or hospitalizations. Is not reporting any complications from methotrexate.  Medications: I have reviewed the patient's current medications.  Current Outpatient Prescriptions  Medication Sig Dispense Refill  . acetaminophen (TYLENOL) 325 MG tablet Take 650 mg by mouth every 6 (six) hours as needed. For pain or fever      . alendronate (FOSAMAX) 70 MG tablet TAKE 1 TABLET BY MOUTH EVERY WEEK TAKE WITH 8 OUNCES OF WATER AND ON EMPTY STOMACH AS DIRECTED  12 tablet  3  . aspirin EC 81 MG tablet Take 81 mg by mouth daily.      Marland Kitchen  BACITRACIN ZINC EX Apply 1 application topically as needed.      . diltiazem (CARDIZEM CD) 120 MG 24 hr capsule TAKE ONE CAPSULE BY MOUTH EVERY DAY  90 capsule  3  . ferrous sulfate 325 (65 FE) MG tablet TAKE 1 TABLET BY MOUTH DAILY.  90 tablet  3  . finasteride (PROSCAR) 5 MG tablet TAKE 1 TABLET BY MOUTH EVERY DAY  90 tablet  3  . fluticasone (FLONASE) 50 MCG/ACT nasal spray Place 2 sprays into the nose daily as needed. For allergies  16 g  5  . folic acid (FOLVITE) 762 MCG tablet Take 400 mcg by mouth daily.       . furosemide (LASIX) 20 MG tablet Take 1 tablet (20 mg total) by mouth daily.  30 tablet  3  . leflunomide (ARAVA) 20 MG tablet Take 20 mg by mouth daily.       Marland Kitchen lisinopril (PRINIVIL,ZESTRIL) 10 MG tablet Take one tablet by mouth daily  90 tablet  4  . methotrexate (RHEUMATREX) 2.5 MG tablet Take 2.5 mg by mouth once a week. On Sunday Caution:Chemotherapy.      . metoprolol succinate (TOPROL-XL) 100 MG 24 hr tablet TAKE 1 TABLET BY MOUTH EVERY DAY  90 tablet  1  . predniSONE (DELTASONE) 5 MG tablet Take 1 tablet by mouth daily.      . simvastatin (ZOCOR) 40 MG tablet TAKE 1 TABLET BY MOUTH AT BEDTIME  90 tablet  4  . warfarin (COUMADIN) 5  MG tablet TAKE 1 TABLET BY MOUTH AS DIRECTED BY COUMADIN CLINIC  30 tablet  3   Current Facility-Administered Medications  Medication Dose Route Frequency Provider Last Rate Last Dose  . darbepoetin (ARANESP) injection 300 mcg  300 mcg Subcutaneous Q28 days Maryanna Shape, NP       Facility-Administered Medications Ordered in Other Visits  Medication Dose Route Frequency Provider Last Rate Last Dose  . darbepoetin (ARANESP) injection 300 mcg  300 mcg Subcutaneous Q28 days Maryanna Shape, NP        Allergies: No Known Allergies  Past Medical History, Surgical history, Social history, and Family History were reviewed and updated.  Review of Systems: Constitutional:  Negative for fever, chills, night sweats, anorexia, weight loss,  pain.  Remaining ROS negative.  Physical Exam: Blood pressure 114/72, pulse 84, temperature 97.8 F (36.6 C), temperature source Oral, resp. rate 18, height 5\' 9"  (1.753 m), weight 149 lb 6.4 oz (67.767 kg), SpO2 97.00%. ECOG: 1 General appearance: alert Head: Normocephalic, without obvious abnormality, atraumatic Neck: no adenopathy, no carotid bruit, no JVD, supple, symmetrical, trachea midline and thyroid not enlarged, symmetric, no tenderness/mass/nodules Lymph nodes: Cervical, supraclavicular, and axillary nodes normal. Heart:regular rate and rhythm, S1, S2 normal, no murmur, click, rub or gallop Lung:chest clear, no wheezing, rales, normal symmetric air entry Abdomen: soft, non-tender, without masses or organomegaly EXT:no erythema, induration, or nodules  Lab Results: Lab Results  Component Value Date   WBC 9.1 06/17/2013   HGB 9.7* 06/17/2013   HCT 30.2* 06/17/2013   MCV 90.4 06/17/2013   PLT 422* 06/17/2013     Chemistry      Component Value Date/Time   NA 135 04/29/2013 1538   NA 141 12/16/2012 0926   NA 141 10/22/2011 0944   K 4.8 04/29/2013 1538   K 3.8 12/16/2012 0926   K 4.8* 10/22/2011 0944   CL 102 04/29/2013 1538   CL 111* 06/11/2012 1102   CL 98 10/22/2011 0944   CO2 24 04/29/2013 1538   CO2 27 12/16/2012 0926   CO2 26 10/22/2011 0944   BUN 44* 04/29/2013 1538   BUN 22.0 12/16/2012 0926   BUN 29* 10/22/2011 0944   CREATININE 1.8* 04/29/2013 1538   CREATININE 1.2 12/16/2012 0926   CREATININE 1.5* 10/22/2011 0944      Component Value Date/Time   CALCIUM 8.9 04/29/2013 1538   CALCIUM 9.2 12/16/2012 0926   CALCIUM 8.8 10/22/2011 0944   CALCIUM 9.4 10/14/2007 0110   ALKPHOS 65 12/16/2012 0926   ALKPHOS 76 10/22/2011 0944   ALKPHOS 68 07/03/2011 1323   AST 13 12/16/2012 0926   AST 23 10/22/2011 0944   AST 19 07/03/2011 1323   ALT 10 12/16/2012 0926   ALT 14 10/22/2011 0944   ALT 14 07/03/2011 1323   BILITOT 0.76 12/16/2012 0926   BILITOT 1.10 10/22/2011 0944   BILITOT 0.8  07/03/2011 1323      Impression and Plan: .  A 80 year old gentleman with the following issues: 1. Stage IB lung cancer status post surgical resection.  CT scan from 06/2012 continues to have no evidence of recurrent disease.  Continue on active surveillance. He will have another CT scan as needed. I will repeat a CXR instead in 12/2013.  2. Anemia, multifactorial.  Continue to receive Aranesp to keep his hemoglobin above 10.  Aranesp will be 300 mcg every 8 weeks instead of 4 weeks given the stability of his Hgb. Aranesp injection was held today due  to Hgb of 10.3. 3. T1c Gleason score 8 adenocarcinoma of the prostate with a PSA of 14.2.  He is S/P  radiation therapy without evidence of relapse. PSA is followed by Urology. 4. Follow-up. In 2 months.  FTDDUK,GURKY                  1/9/20153:41 PM

## 2013-06-21 ENCOUNTER — Encounter: Payer: Self-pay | Admitting: Vascular Surgery

## 2013-06-22 ENCOUNTER — Ambulatory Visit (INDEPENDENT_AMBULATORY_CARE_PROVIDER_SITE_OTHER): Payer: Medicare Other | Admitting: Vascular Surgery

## 2013-06-22 ENCOUNTER — Other Ambulatory Visit: Payer: Self-pay | Admitting: *Deleted

## 2013-06-22 ENCOUNTER — Encounter: Payer: Self-pay | Admitting: *Deleted

## 2013-06-22 ENCOUNTER — Encounter: Payer: Self-pay | Admitting: Vascular Surgery

## 2013-06-22 VITALS — BP 99/84 | HR 98 | Resp 18 | Ht 69.0 in | Wt 149.0 lb

## 2013-06-22 DIAGNOSIS — L98499 Non-pressure chronic ulcer of skin of other sites with unspecified severity: Principal | ICD-10-CM

## 2013-06-22 DIAGNOSIS — I739 Peripheral vascular disease, unspecified: Secondary | ICD-10-CM

## 2013-06-22 NOTE — Progress Notes (Signed)
Patient name: [name] MRN: [id number] DOB: 1931-12-29 Sex: male  REASON FOR VISIT: Follow up of left great toe  HPI: [name] is a 80 y.o. male who I last saw on 05/11/2013. He has a wound on the medial aspect of his left great toe. He has undergone previous arteriography in February of last year which showed diffuse calcific disease of the aorta and iliac arteries but no focal stenosis. He has single vessel runoff on the left via the peroneal artery. He did have some plaque in the left common femoral artery but I did not think addressing this was significantly impact his distal circulation. When I saw him last however I was unable to palpate a left femoral pulse and certainly his disease could have progressed since his arteriogram in February of 2014. He comes in for a 6 week follow up visit.  Of note he was also scheduled for a stress test by Dr. Sherren Mocha. Myocardial perfusion study on 05/20/2013 shows an intermediate risk stress nuclear study. There is an inferior lateral wall infarct with no ischemia. He has lateral wall dyskinesis with an ejection fraction of 40%. It was moderately decreased LV function.  He denies significant claudication although his activity is fairly limited. He denies rest pain. He has a persistent wound on the medial aspect of his left great toe and approximately 2 months ago developed a small wound on the arch of his left foot.  REVIEW OF SYSTEMS: Valu.Nieves ] denotes positive finding; [  ] denotes negative finding  CARDIOVASCULAR:  [ ]  chest pain   [ ]  dyspnea on exertion    CONSTITUTIONAL:  [ ]  fever   [ ]  chills  PHYSICAL EXAM: Filed Vitals:   06/22/13 1353  BP: 99/84  Pulse: 98  Resp: 18  Height: 5\' 9"  (1.753 m)  Weight: 149 lb (67.586 kg)   Body mass index is 21.99 kg/(m^2). GENERAL: The patient is a well-nourished male, in no acute distress. The vital signs are documented above. CARDIOVASCULAR: There is a regular rate and rhythm. He has a  palpable right femoral pulse. I cannot palpate a left femoral pulse. He has a peroneal and posterior tibial signal on the left with the Doppler which is monophasic and dampened. PULMONARY: There is good air exchange bilaterally without wheezing or rales. The wound on the medial aspect of his left foot has not changed. There is no significant drainage or erythema.  The last creatinine that I see was on 04/29/2013. At that time his creatinine was 1.8.  MEDICAL ISSUES:  Atherosclerosis of native arteries of the extremities with ulceration(440.23) I do not think the patient has adequate circulation to heal the wound on the left foot. This could ultimately become a limb threatening situation. I think that the disease on the left has progressed since his arteriogram a year ago his I am no longer able to palpate a left femoral pulse. I recommended that we proceed with arteriography. We may need to do this with CO2 depending upon what his creatinine is. The only potential option I saw a year ago was endarterectomy of the common femoral artery although at that time I did not think that was significantly impact his distal perfusion. He has single vessel runoff via the peroneal artery on the left.  In addition, given his recent myocardial perfusion scan which was abnormal I will discuss the case with Dr. Sherren Mocha so that we can decide the safest approach.   DICKSON,[name] S  [facility] [place]: [phone]

## 2013-06-22 NOTE — Assessment & Plan Note (Signed)
I do not think the patient has adequate circulation to heal the wound on the left foot. This could ultimately become a limb threatening situation. I think that the disease on the left has progressed since his arteriogram a year ago his I am no longer able to palpate a left femoral pulse. I recommended that we proceed with arteriography. We may need to do this with CO2 depending upon what his creatinine is. The only potential option I saw a year ago was endarterectomy of the common femoral artery although at that time I did not think that was significantly impact his distal perfusion. He has single vessel runoff via the peroneal artery on the left.  In addition, given his recent myocardial perfusion scan which was abnormal I will discuss the case with Dr. Sherren Mocha so that we can decide the safest approach.

## 2013-06-25 ENCOUNTER — Other Ambulatory Visit: Payer: Self-pay | Admitting: Internal Medicine

## 2013-06-30 ENCOUNTER — Encounter (HOSPITAL_COMMUNITY): Payer: Self-pay

## 2013-07-01 ENCOUNTER — Ambulatory Visit (INDEPENDENT_AMBULATORY_CARE_PROVIDER_SITE_OTHER): Payer: Medicare Other | Admitting: *Deleted

## 2013-07-01 DIAGNOSIS — Z8679 Personal history of other diseases of the circulatory system: Secondary | ICD-10-CM

## 2013-07-01 DIAGNOSIS — I4891 Unspecified atrial fibrillation: Secondary | ICD-10-CM

## 2013-07-01 LAB — POCT INR: INR: 3

## 2013-07-02 ENCOUNTER — Other Ambulatory Visit: Payer: Self-pay | Admitting: Cardiovascular Disease

## 2013-07-05 ENCOUNTER — Ambulatory Visit (INDEPENDENT_AMBULATORY_CARE_PROVIDER_SITE_OTHER): Payer: Medicare Other | Admitting: *Deleted

## 2013-07-05 DIAGNOSIS — Z8679 Personal history of other diseases of the circulatory system: Secondary | ICD-10-CM

## 2013-07-05 DIAGNOSIS — I4891 Unspecified atrial fibrillation: Secondary | ICD-10-CM

## 2013-07-05 LAB — BASIC METABOLIC PANEL
BUN: 39 mg/dL — ABNORMAL HIGH (ref 6–23)
CO2: 26 mEq/L (ref 19–32)
Calcium: 9.4 mg/dL (ref 8.4–10.5)
Chloride: 102 mEq/L (ref 96–112)
Creatinine, Ser: 1.9 mg/dL — ABNORMAL HIGH (ref 0.4–1.5)
GFR: 44.67 mL/min — AB (ref >60.00)
Glucose, Bld: 102 mg/dL — ABNORMAL HIGH (ref 70–99)
POTASSIUM: 4.1 meq/L (ref 3.5–5.1)
Sodium: 137 mEq/L (ref 135–145)

## 2013-07-05 LAB — POCT INR: INR: 2.6

## 2013-07-06 ENCOUNTER — Telehealth: Payer: Self-pay | Admitting: *Deleted

## 2013-07-06 MED ORDER — ENOXAPARIN SODIUM 80 MG/0.8ML ~~LOC~~ SOLN: 80.0000 mg | SUBCUTANEOUS | Status: DC

## 2013-07-06 NOTE — Patient Instructions (Signed)
07/06/13- Do Nothing 07/07/13- Do Nothing  07/08/13- Start Lovenox 80mg s SQ at 8am 07/09/13- Continue Lovenox 80mg s SQ at 8am 07/10/13- Lovenox 80mg s SQ at 8am 07/11/13- Do Nothing, Day of Procedure 07/12/13- Restart Coumadin take 5mg s, REstart Lovenox 80mg s SQ at 8am 07/13/13- Take 7.5mg s Coumadin, Lovenox 80mg s SQ at 8am 07/14/13- Take 2.5mg s coumadin, Lovenox 80mg s SQ at 8am 07/15/13- CVRR appt at 945am, Take Lovenox 80mg s SQ at 8am.

## 2013-07-06 NOTE — Telephone Encounter (Signed)
Message copied by Zenovia Jarred on Wed Jul 06, 2013  3:26 PM ------      Message from: Emmaline Life      Created: Tue Jul 05, 2013 11:48 AM                   ----- Message -----         From: Sherren Mocha, MD         Sent: 07/04/2013  11:27 PM           To: Emmaline Life, RN            Should be bridged with hx TIA. Stress test showed scar without ischemia so I don't think further workup needed. thx      ----- Message -----         From: Emmaline Life, RN         Sent: 07/01/2013  10:48 AM           To: Zenovia Jarred, RN, Sherren Mocha, MD            This patient came in to coumadin clinic today and told Tiffany Dr. Scot Dock was supposed to contact you about stopping Coumadin on 1/29 for 2/2 Abd aortogram.  Patient has been bridged w/Lovenox in the past.  Hx: a-fib, TIA; Can patient stop Coumadin on 1/29 and does he need to be bridged?  See Dr. Nicole Cella note from visit on 1/14 - he states at bottom he is going to discuss pt with you due to recent abnormal myocardial perfusion scan.            Thanks,      Sharyn Lull       ------

## 2013-07-11 ENCOUNTER — Encounter (HOSPITAL_COMMUNITY)
Admission: RE | Disposition: A | Discharge status: Home / Self Care | Payer: Self-pay | Source: Ambulatory Visit | Attending: Vascular Surgery

## 2013-07-11 ENCOUNTER — Ambulatory Visit (HOSPITAL_COMMUNITY)
Admission: RE | Admit: 2013-07-11 | Discharge: 2013-07-11 | Disposition: A | Discharge status: Home / Self Care | Payer: Medicare Other | Source: Ambulatory Visit | Attending: Vascular Surgery | Admitting: Vascular Surgery

## 2013-07-11 DIAGNOSIS — L97509 Non-pressure chronic ulcer of other part of unspecified foot with unspecified severity: Secondary | ICD-10-CM | POA: Insufficient documentation

## 2013-07-11 DIAGNOSIS — L98499 Non-pressure chronic ulcer of skin of other sites with unspecified severity: Secondary | ICD-10-CM

## 2013-07-11 DIAGNOSIS — I739 Peripheral vascular disease, unspecified: Secondary | ICD-10-CM | POA: Insufficient documentation

## 2013-07-11 DIAGNOSIS — R944 Abnormal results of kidney function studies: Secondary | ICD-10-CM | POA: Insufficient documentation

## 2013-07-11 HISTORY — PX: ABDOMINAL AORTAGRAM: SHX5454

## 2013-07-11 LAB — POCT I-STAT, CHEM 8
BUN: 34 mg/dL — ABNORMAL HIGH (ref 6–23)
Calcium, Ion: 1.02 mmol/L — ABNORMAL LOW (ref 1.13–1.30)
Chloride: 102 mEq/L (ref 96–112)
Creatinine, Ser: 1.6 mg/dL — ABNORMAL HIGH (ref 0.50–1.35)
GLUCOSE: 104 mg/dL — AB (ref 70–99)
HEMATOCRIT: 38 % — AB (ref 39.0–52.0)
HEMOGLOBIN: 12.9 g/dL — AB (ref 13.0–17.0)
Potassium: 3.9 mEq/L (ref 3.7–5.3)
SODIUM: 140 meq/L (ref 137–147)
TCO2: 23 mmol/L (ref 0–100)

## 2013-07-11 LAB — PROTIME-INR
INR: 1.39 (ref 0.00–1.49)
Prothrombin Time: 16.7 seconds — ABNORMAL HIGH (ref 11.6–15.2)

## 2013-07-11 SURGERY — ABDOMINAL AORTAGRAM
Anesthesia: LOCAL

## 2013-07-11 MED ORDER — ONDANSETRON HCL 4 MG/2ML IJ SOLN: 4.0000 mg | Freq: Four times a day (QID) | INTRAMUSCULAR | Status: DC | PRN

## 2013-07-11 MED ORDER — HEPARIN (PORCINE) IN NACL 2-0.9 UNIT/ML-% IJ SOLN
INTRAMUSCULAR | Status: AC
  Filled 2013-07-11: qty 1000

## 2013-07-11 MED ORDER — FENTANYL CITRATE 0.05 MG/ML IJ SOLN
INTRAMUSCULAR | Status: AC
Start: 2013-07-11 — End: 2013-07-11
  Filled 2013-07-11: qty 2

## 2013-07-11 MED ORDER — SODIUM CHLORIDE 0.9 % IV SOLN: 1.0000 mL/kg/h | INTRAVENOUS | Status: DC

## 2013-07-11 MED ORDER — LIDOCAINE HCL (PF) 1 % IJ SOLN
INTRAMUSCULAR | Status: AC
  Filled 2013-07-11: qty 30

## 2013-07-11 MED ORDER — SODIUM CHLORIDE 0.9 % IV SOLN: INTRAVENOUS | Status: DC

## 2013-07-11 MED ORDER — MIDAZOLAM HCL 2 MG/2ML IJ SOLN
INTRAMUSCULAR | Status: AC
  Filled 2013-07-11: qty 2

## 2013-07-11 MED ORDER — ACETAMINOPHEN 325 MG PO TABS: 650.0000 mg | ORAL_TABLET | ORAL | Status: DC | PRN

## 2013-07-11 NOTE — Op Note (Signed)
   PATIENT: [name]   MRN: [id number] DOB: 05-16-1932    DATE OF PROCEDURE: 07/11/2013  INDICATIONS: [name] is a 80 y.o. male who has a nonhealing wound on his left great toe. He comes in for arteriography to evaluate him for possible revascularization.  PROCEDURE:  1. Ultrasound-guided access the left common femoral artery 2. Aortogram with bilateral iliac arteriogram  3. Left lower extremity runoff  SURGEON: Judeth Cornfield. Scot Dock, MD, FACS  ANESTHESIA: local with sedation   EBL: minimal  TECHNIQUE: The patient was brought to the peripheral vascular lab and received 1 mg of Versed and 50 mcg of fentanyl. Both groins are prepped and draped in the usual sterile fashion. I interrogated the right groin was ultrasound scanner and he was very little room between the origin of the bypass graft on the right and the inguinal ligament. This reason I did not think it was safe to cannulate above the graft if this could potentially result in a high stick. I therefore elected to cannulate the left groin. Under ultrasound guidance, after the skin was anesthetized, the left common femoral artery was cannulated with a micropuncture needle a micropuncture sheath introduced over a wire. This was then exchanged for a 5 [place] sheath over a [employer] wire. The pigtail catheter was positioned at the L1 vertebral body a flush aortogram obtained. The aortic catheter was positioned above the aortic bifurcation and oblique iliac projection was obtained. I then measured a gradient from pigtail catheter which was positioned in the aorta. This the pressure in the sheath after the catheter was removed. There was no resting gradient. Next a left lower extremity runoff film was obtained. Because of his elevated creatinine of 1.6 I elected to limit the contrast and did not study the right leg. At the completion of the procedure, the patient was transferred to the holding area for removal of the sheath.  FINDINGS:  1.  Single renal arteries bilaterally with an approximately 30% right renal artery stenosis. 2. Mild diffuse calcific disease of the infrarenal aorta, bilateral common iliac arteries and bilateral external iliac arteries but no focal stenosis. 3. The right common femoral deep femoral and proximal femoropopliteal bypass graft are patent on the right. The native right superficial femoral artery is occluded proximally. 4. On the left side, which is the symptomatic side, there is a focal approximately 60-70% stenosis in the distal left common femoral artery. Superficial femoral artery is patent as is the deep femoral artery. Popped artery is patent. The posterior tibial and anterior tibial arteries are occluded. There is single vessel runoff on the left via the peroneal artery.  Deitra Mayo, MD, FACS [facility]  DATE OF DICTATION:   07/11/2013

## 2013-07-11 NOTE — Discharge Instructions (Signed)
Angiography, Care After Refer to this sheet in the next few weeks. These instructions provide you with information on caring for yourself after your procedure. Your health care provider may also give you more specific instructions. Your treatment has been planned according to current medical practices, but problems sometimes occur. Call your health care provider if you have any problems or questions after your procedure.  WHAT TO EXPECT AFTER THE PROCEDURE After your procedure, it is typical to have the following sensations:  Minor discomfort or tenderness and a small bump at the catheter insertion site. The bump should usually decrease in size and tenderness within 1 to 2 weeks.  Any bruising will usually fade within 2 to 4 weeks. HOME CARE INSTRUCTIONS   You may need to keep taking blood thinners if they were prescribed for you. Only take over-the-counter or prescription medicines for pain, fever, or discomfort as directed by your health care provider.  Do not apply powder or lotion to the site.  Do not sit in a bathtub, swimming pool, or whirlpool for 5 to 7 days.  You may shower 24 hours after the procedure. Remove the bandage (dressing) and gently wash the site with plain soap and water. Gently pat the site dry.  Inspect the site at least twice daily.  Limit your activity for the first 24 hours. Do not bend, squat, or lift anything over 10 lb (9 kg) or as directed by your health care provider.  Do not drive home if you are discharged the day of the procedure. Have someone else drive you. Follow instructions about when you can drive or return to work. SEEK MEDICAL CARE IF:  You get lightheaded when standing up.  You have drainage (other than a small amount of blood on the dressing).  You have chills.  You have a fever.  You have redness, warmth, swelling, or pain at the insertion site. SEEK IMMEDIATE MEDICAL CARE IF:   You develop chest pain or shortness of breath, feel faint,  or pass out.  You have bleeding, swelling larger than a walnut, or drainage from the catheter insertion site.  You develop pain, discoloration, coldness, or severe bruising in the leg or arm that held the catheter.  You have heavy bleeding from the site. If this happens, hold pressure on the site. MAKE SURE YOU:  Understand these instructions.  Will watch your condition.  Will get help right away if you are not doing well or get worse. Document Released: 12/12/2004 Document Revised: 01/26/2013 Document Reviewed: 10/18/2012 [facility] Patient Information 2014 [place].

## 2013-07-11 NOTE — Interval H&P Note (Signed)
History and Physical Interval Note:  07/11/2013 7:25 AM  [name]  has presented today for surgery, with the diagnosis of PVD 440.23  The various methods of treatment have been discussed with the patient and family. After consideration of risks, benefits and other options for treatment, the patient has consented to  Procedure(s): ABDOMINAL AORTAGRAM (N/A) as a surgical intervention .  The patient's history has been reviewed, patient examined, no change in status, stable for surgery.  I have reviewed the patient's chart and labs.  Questions were answered to the patient's satisfaction.     DICKSON,[name] S

## 2013-07-11 NOTE — H&P (View-Only) (Signed)
Patient name: [name] MRN: [id number] DOB: 10-09-1931 Sex: male  REASON FOR VISIT: Follow up of left great toe  HPI: [name] is a 80 y.o. male who I last saw on 05/11/2013. He has a wound on the medial aspect of his left great toe. He has undergone previous arteriography in February of last year which showed diffuse calcific disease of the aorta and iliac arteries but no focal stenosis. He has single vessel runoff on the left via the peroneal artery. He did have some plaque in the left common femoral artery but I did not think addressing this was significantly impact his distal circulation. When I saw him last however I was unable to palpate a left femoral pulse and certainly his disease could have progressed since his arteriogram in February of 2014. He comes in for a 6 week follow up visit.  Of note he was also scheduled for a stress test by Dr. Sherren Mocha. Myocardial perfusion study on 05/20/2013 shows an intermediate risk stress nuclear study. There is an inferior lateral wall infarct with no ischemia. He has lateral wall dyskinesis with an ejection fraction of 40%. It was moderately decreased LV function.  He denies significant claudication although his activity is fairly limited. He denies rest pain. He has a persistent wound on the medial aspect of his left great toe and approximately 2 months ago developed a small wound on the arch of his left foot.  REVIEW OF SYSTEMS: Valu.Nieves ] denotes positive finding; [  ] denotes negative finding  CARDIOVASCULAR:  [ ]  chest pain   [ ]  dyspnea on exertion    CONSTITUTIONAL:  [ ]  fever   [ ]  chills  PHYSICAL EXAM: Filed Vitals:   06/22/13 1353  BP: 99/84  Pulse: 98  Resp: 18  Height: 5\' 9"  (1.753 m)  Weight: 149 lb (67.586 kg)   Body mass index is 21.99 kg/(m^2). GENERAL: The patient is a well-nourished male, in no acute distress. The vital signs are documented above. CARDIOVASCULAR: There is a regular rate and rhythm. He has a  palpable right femoral pulse. I cannot palpate a left femoral pulse. He has a peroneal and posterior tibial signal on the left with the Doppler which is monophasic and dampened. PULMONARY: There is good air exchange bilaterally without wheezing or rales. The wound on the medial aspect of his left foot has not changed. There is no significant drainage or erythema.  The last creatinine that I see was on 04/29/2013. At that time his creatinine was 1.8.  MEDICAL ISSUES:  Atherosclerosis of native arteries of the extremities with ulceration(440.23) I do not think the patient has adequate circulation to heal the wound on the left foot. This could ultimately become a limb threatening situation. I think that the disease on the left has progressed since his arteriogram a year ago his I am no longer able to palpate a left femoral pulse. I recommended that we proceed with arteriography. We may need to do this with CO2 depending upon what his creatinine is. The only potential option I saw a year ago was endarterectomy of the common femoral artery although at that time I did not think that was significantly impact his distal perfusion. He has single vessel runoff via the peroneal artery on the left.  In addition, given his recent myocardial perfusion scan which was abnormal I will discuss the case with Dr. Sherren Mocha so that we can decide the safest approach.   DICKSON,[name] S  [facility] [place]: [phone]

## 2013-07-15 ENCOUNTER — Ambulatory Visit (INDEPENDENT_AMBULATORY_CARE_PROVIDER_SITE_OTHER): Payer: Medicare Other | Admitting: *Deleted

## 2013-07-15 DIAGNOSIS — Z8679 Personal history of other diseases of the circulatory system: Secondary | ICD-10-CM

## 2013-07-15 DIAGNOSIS — I4891 Unspecified atrial fibrillation: Secondary | ICD-10-CM

## 2013-07-15 DIAGNOSIS — Z5181 Encounter for therapeutic drug level monitoring: Secondary | ICD-10-CM

## 2013-07-15 LAB — POCT INR: INR: 1.4

## 2013-07-16 ENCOUNTER — Other Ambulatory Visit: Payer: Self-pay | Admitting: Internal Medicine

## 2013-07-20 ENCOUNTER — Encounter: Payer: Self-pay | Admitting: Physician Assistant

## 2013-07-20 ENCOUNTER — Ambulatory Visit (INDEPENDENT_AMBULATORY_CARE_PROVIDER_SITE_OTHER): Payer: Medicare Other | Admitting: Physician Assistant

## 2013-07-20 ENCOUNTER — Ambulatory Visit (INDEPENDENT_AMBULATORY_CARE_PROVIDER_SITE_OTHER): Payer: Medicare Other | Admitting: *Deleted

## 2013-07-20 VITALS — BP 102/60 | HR 76 | Ht 69.0 in | Wt 146.0 lb

## 2013-07-20 DIAGNOSIS — Z01810 Encounter for preprocedural cardiovascular examination: Secondary | ICD-10-CM

## 2013-07-20 DIAGNOSIS — I739 Peripheral vascular disease, unspecified: Secondary | ICD-10-CM

## 2013-07-20 DIAGNOSIS — I4891 Unspecified atrial fibrillation: Secondary | ICD-10-CM

## 2013-07-20 DIAGNOSIS — I2510 Atherosclerotic heart disease of native coronary artery without angina pectoris: Secondary | ICD-10-CM

## 2013-07-20 DIAGNOSIS — I2589 Other forms of chronic ischemic heart disease: Secondary | ICD-10-CM

## 2013-07-20 DIAGNOSIS — E785 Hyperlipidemia, unspecified: Secondary | ICD-10-CM

## 2013-07-20 DIAGNOSIS — I5022 Chronic systolic (congestive) heart failure: Secondary | ICD-10-CM

## 2013-07-20 DIAGNOSIS — I10 Essential (primary) hypertension: Secondary | ICD-10-CM

## 2013-07-20 DIAGNOSIS — Z5181 Encounter for therapeutic drug level monitoring: Secondary | ICD-10-CM

## 2013-07-20 DIAGNOSIS — Z8679 Personal history of other diseases of the circulatory system: Secondary | ICD-10-CM

## 2013-07-20 LAB — POCT INR: INR: 1.8

## 2013-07-20 MED ORDER — LISINOPRIL 5 MG PO TABS: 5.0000 mg | ORAL_TABLET | Freq: Every day | ORAL | Status: AC

## 2013-07-20 NOTE — Patient Instructions (Signed)
DECREASE LISINOPRIL TO 5 MG DAILY  WE WILL SEND OUT A REMINDER LETTER FOR YOU TO SEE DR. Burt Knack IN 04/2014

## 2013-07-20 NOTE — Progress Notes (Signed)
[address] [place], [place]  [address] Phone: [phone] Fax:  [phone]  Date:  07/20/2013   ID:  [name], DOB 05/02/1932, MRN [id number]  PCP:  Chancy Hurter, MD  Cardiologist:  Dr. Sherren Mocha     History of Present Illness: [name] is a 80 y.o. male with a hx of CAD, s/p CABG 2003, AFib/Flutter, s/p AFlutter RFCA in 2006, PAD s/p prior R iliac and SFA PTA, prior TIA, HTN, HL, Lung CA s/p lobectomy, prostate CA, CKD, RA.  He was last seen by Dr. Burt Knack in 04/2013. He had some increased dyspnea.  Stress testing was arranged. His Myoview was intermediate risk with prior inferior and inferolateral scar but no ischemia and an EF of 40%. Continued medical therapy was recommended.  He has been seen by Dr. Scot Dock and had a lower extremity angiogram. He has had a nonhealing ulcer on his left foot. He has significant left common femoral artery stenosis. He is referred back for surgical clearance.  LHC (05/2005):  pLAD 40, pCFX occluded, dRCA 90; S-RCA ok, S-OM/PLB ok, L-LAD ok but atrophic.  Med Rx.   CLite (02/2008):  Inf and IL scar, no ischemia, EF 52%.   Echo (04/2013):  EF 40-45%, diff HK, MAC, mild MR, mild to mod RAE.  Cardiolite (02/2008): inferior and inferolateral scar, no ischemia, EF 52% Lexiscan Myoview (05/2013):  Intermediate risk; Inf-lat scar, no ischemia, EF 40%.   Carotid US (01/2013):  40-59% bilateral ICA (f/u 2 yrs)  Patient denies any chest pain. He continues to have dyspnea with exertion. Some days are better than others. He probably describes NYHA class 2-2b symptoms. He denies significant wheezing or cough. He sometimes feelss more dyspneic in a hot shower. He denies orthopnea, PND or edema. He denies syncope.  Recent Labs: 12/16/2012: ALT 10  03/18/2013: Pro B Natriuretic peptide (BNP) 459.0*  07/11/2013: Creatinine 1.60*; Hemoglobin 12.9*; Potassium 3.9   Wt Readings from Last 3 Encounters:  07/11/13 149 lb (67.586 kg)    07/11/13 149 lb (67.586 kg)  06/22/13 149 lb (67.586 kg)     Past Medical History  Diagnosis Date  . CARCINOMA, LUNG, SQUAMOUS CELL 08/21/2008  . HYPERLIPIDEMIA 11/13/2006  . ANEMIA 09/07/2009  . MYOCARDIAL INFARCTION, HX OF 11/13/2006  . CORONARY ARTERY DISEASE 11/13/2006  . Atrial fibrillation 11/13/2006  . CONGESTIVE HEART FAILURE 02/01/2007  . CAROTID ARTERY STENOSIS, WITHOUT INFARCTION 09/07/2008  . KIDNEY DISEASE, CHRONIC, STAGE II 10/13/2007  . Rheumatoid arthritis(714.0) 11/13/2006  . OSTEOPENIA 11/13/2006  . Cancer of prostate 10/24/2009  . TRANSIENT ISCHEMIC ATTACK, HX OF 11/13/2006  . COLONIC POLYPS, HX OF 07/04/2003    6 mm adenoma  . Femoral artery occlusion     right  . PAD (peripheral artery disease)   . Stroke 2008    Current Outpatient Prescriptions  Medication Sig Dispense Refill  . acetaminophen (TYLENOL) 325 MG tablet Take 650 mg by mouth every 6 (six) hours as needed. For pain or fever      . alendronate (FOSAMAX) 70 MG tablet Take 70 mg by mouth every Sunday. Take with a full glass of water on an empty stomach.      Marland Kitchen aspirin EC 81 MG tablet Take 81 mg by mouth daily.      . [facility] IRON 325 (65 FE) MG tablet TAKE 1 TABLET BY MOUTH DAILY.  90 tablet  3  . diltiazem (CARDIZEM CD) 120 MG 24 hr capsule TAKE ONE CAPSULE  BY MOUTH EVERY DAY  90 capsule  3  . enoxaparin (LOVENOX) 80 MG/0.8ML injection Inject 0.8 mLs (80 mg total) into the skin daily.  10 Syringe  1  . finasteride (PROSCAR) 5 MG tablet TAKE 1 TABLET BY MOUTH EVERY DAY  90 tablet  3  . fluticasone (FLONASE) 50 MCG/ACT nasal spray Place 2 sprays into the nose daily as needed. For allergies  16 g  5  . folic acid (FOLVITE) 025 MCG tablet Take 400 mcg by mouth daily.       . furosemide (LASIX) 20 MG tablet Take 1 tablet (20 mg total) by mouth daily.  30 tablet  3  . leflunomide (ARAVA) 20 MG tablet Take 20 mg by mouth daily.       Marland Kitchen lisinopril (PRINIVIL,ZESTRIL) 10 MG tablet Take 10 mg by mouth daily.      . methotrexate  (RHEUMATREX) 2.5 MG tablet Take 2.5 mg by mouth once a week. On Sunday Caution:Chemotherapy.      . metoprolol succinate (TOPROL-XL) 100 MG 24 hr tablet Take 100 mg by mouth daily. Take with or immediately following a meal.      . predniSONE (DELTASONE) 5 MG tablet Take 5 mg by mouth daily.       . simvastatin (ZOCOR) 40 MG tablet Take 40 mg by mouth daily.      Marland Kitchen warfarin (COUMADIN) 5 MG tablet TAKE 1 TABLET BY MOUTH AS DIRECTED BY COUMADIN CLINIC  30 tablet  3   No current facility-administered medications for this visit.    Allergies:   Review of patient's allergies indicates no known allergies.   Social History:  The patient  reports that he quit smoking about 12 years ago. He has never used smokeless tobacco. He reports that he does not drink alcohol or use illicit drugs.   Family History:  The patient's family history includes Cancer in his sister; Tuberculosis in his mother.   ROS:  Please see the history of present illness.   He denies any bleeding problems.   All other systems reviewed and negative.   PHYSICAL EXAM: VS:  BP 102/60  Pulse 76  Ht 5\' 9"  (1.753 m)  Wt 146 lb (66.225 kg)  BMI 21.55 kg/m2 Well nourished, well developed, in no acute distress HEENT: normal Neck: no JVD Cardiac:  normal S1, S2; irregularly irregular rhythm; no murmur Lungs:  Decreased breath sounds bilaterally, no wheezing, rhonchi or rales Abd: soft, nontender, no hepatomegaly Ext: trace bilateral LE edema MSK:  Hands with swan neck deformities Skin: warm and dry Neuro:  CNs 2-12 intact, no focal abnormalities noted  EKG:  AFib, HR 76     ASSESSMENT AND PLAN:  1. Surgical Clearance:  He is not having any unstable cardiac conditions.  He had a recent Myoview with stable findings consistent with prior inferior scar but no ischemia.  EF is stable.  He does not require further cardiac workup.  He is at acceptable risk for his non cardiac surgery.  He is on coumadin for chronic AFib.  He has a hx of  TIA.  He will need Lovenox to bridge him while off of coumadin.  I reviewed his case with Dr. Sherren Mocha who agreed. Continue beta blocker in perioperative period to reduce risk of CV complications.   2. CAD: As noted, recent Myoview consistent with prior studies. Continue medical therapy. He remains on aspirin, beta blocker, statin. 3. Ischemic cardiomyopathy: Stable anatomy. Continue beta blocker, ACEI.  He is  on Cardizem. However, I presume that he requires this to maintain adequate rate control with AFib. 4. Chronic Systolic CHF:  Volume stable.  5. Atrial fibrillation: Heart rate controlled. Continue current therapy. He remains on Coumadin. As noted, he will need Lovenox bridge while off of Coumadin for his upcoming procedure. 6. Hypertension: His blood pressure does tend to run somewhat low. I wonder if this is causing him some difficulty. I will decrease his lisinopril to 5 mg daily. 7. Hyperlipidemia: Continue statin. 8. Peripheral arterial disease: Continue follow up with vascular surgery as planned. 9. Disposition: Follow up with Dr. Burt Knack as planned.  Signed, Richardson Dopp, PA-C  07/20/2013 10:27 AM

## 2013-07-25 ENCOUNTER — Telehealth: Payer: Self-pay | Admitting: Vascular Surgery

## 2013-07-25 NOTE — Telephone Encounter (Signed)
notified patient of fu appt. with csd on 08-10-13 at 2:30

## 2013-07-27 ENCOUNTER — Encounter: Payer: Self-pay | Admitting: Vascular Surgery

## 2013-08-05 ENCOUNTER — Ambulatory Visit (INDEPENDENT_AMBULATORY_CARE_PROVIDER_SITE_OTHER): Payer: Medicare Other | Admitting: *Deleted

## 2013-08-05 DIAGNOSIS — Z5181 Encounter for therapeutic drug level monitoring: Secondary | ICD-10-CM

## 2013-08-05 DIAGNOSIS — Z8679 Personal history of other diseases of the circulatory system: Secondary | ICD-10-CM

## 2013-08-05 DIAGNOSIS — I4891 Unspecified atrial fibrillation: Secondary | ICD-10-CM

## 2013-08-05 LAB — POCT INR: INR: 2.6

## 2013-08-09 ENCOUNTER — Encounter: Payer: Self-pay | Admitting: Vascular Surgery

## 2013-08-10 ENCOUNTER — Telehealth: Payer: Self-pay | Admitting: *Deleted

## 2013-08-10 ENCOUNTER — Encounter: Payer: Self-pay | Admitting: Vascular Surgery

## 2013-08-10 ENCOUNTER — Other Ambulatory Visit: Payer: Self-pay | Admitting: *Deleted

## 2013-08-10 ENCOUNTER — Ambulatory Visit (INDEPENDENT_AMBULATORY_CARE_PROVIDER_SITE_OTHER): Payer: Medicare Other | Admitting: Vascular Surgery

## 2013-08-10 VITALS — BP 127/65 | HR 49 | Ht 69.0 in | Wt 147.1 lb

## 2013-08-10 DIAGNOSIS — L98499 Non-pressure chronic ulcer of skin of other sites with unspecified severity: Principal | ICD-10-CM

## 2013-08-10 DIAGNOSIS — I739 Peripheral vascular disease, unspecified: Secondary | ICD-10-CM

## 2013-08-10 NOTE — Progress Notes (Signed)
[facility]  Patient name: [name] MRN: [id number] DOB: 08-27-1931 Sex: male  REASON FOR ADMISSION: Nonhealing wound of left great toe with peripheral vascular disease  HPI: [name] is a 80 y.o. male who I been following with the wound on his left great toe. He underwent an arteriogram which shows a 70% stenosis the distal left common femoral artery. The superficial femoral artery, popliteal, and peroneal arteries are patent. The anterior tibial and posterior tibial arteries are occluded. The only option that might improve blood flow distally would be endarterectomy of the left common femoral artery.  He presents for left common femoral artery endarterectomy and vein patch angioplasty. He has been evaluated by cardiology. It is felt that he does not require further cardiac workup. He is an acceptable risk for his upcoming surgery. He has atrial fibrillation. He will need a Lovenox bridge as per cardiology.   Past Medical History  Diagnosis Date  . CARCINOMA, LUNG, SQUAMOUS CELL 08/21/2008  . HYPERLIPIDEMIA 11/13/2006  . ANEMIA 09/07/2009  . MYOCARDIAL INFARCTION, HX OF 11/13/2006  . CORONARY ARTERY DISEASE 11/13/2006  . Atrial fibrillation 11/13/2006  . CONGESTIVE HEART FAILURE 02/01/2007  . CAROTID ARTERY STENOSIS, WITHOUT INFARCTION 09/07/2008  . KIDNEY DISEASE, CHRONIC, STAGE II 10/13/2007  . Rheumatoid arthritis(714.0) 11/13/2006  . OSTEOPENIA 11/13/2006  . Cancer of prostate 10/24/2009  . TRANSIENT ISCHEMIC ATTACK, HX OF 11/13/2006  . COLONIC POLYPS, HX OF 07/04/2003    6 mm adenoma  . Femoral artery occlusion     right  . PAD (peripheral artery disease)   . Stroke 2008   Family History  Problem Relation Age of Onset  . Tuberculosis Mother   . Cancer Sister     Breast    SOCIAL HISTORY: History  Substance Use Topics  . Smoking status: Former Smoker    Quit date: 06/09/2001  . Smokeless tobacco: Never Used  . Alcohol Use: No   No Known  Allergies Current Outpatient Prescriptions  Medication Sig Dispense Refill  . acetaminophen (TYLENOL) 325 MG tablet Take 650 mg by mouth every 6 (six) hours as needed. For pain or fever      . alendronate (FOSAMAX) 70 MG tablet Take 70 mg by mouth every Sunday. Take with a full glass of water on an empty stomach.      Marland Kitchen aspirin EC 81 MG tablet Take 81 mg by mouth daily.      . [facility] IRON 325 (65 FE) MG tablet TAKE 1 TABLET BY MOUTH DAILY.  90 tablet  3  . diltiazem (CARDIZEM CD) 120 MG 24 hr capsule TAKE ONE CAPSULE BY MOUTH EVERY DAY  90 capsule  3  . finasteride (PROSCAR) 5 MG tablet TAKE 1 TABLET BY MOUTH EVERY DAY  90 tablet  3  . fluticasone (FLONASE) 50 MCG/ACT nasal spray Place 2 sprays into the nose daily as needed. For allergies  16 g  5  . folic acid (FOLVITE) 409 MCG tablet Take 400 mcg by mouth daily.       . furosemide (LASIX) 20 MG tablet Take 1 tablet (20 mg total) by mouth daily.  30 tablet  3  . leflunomide (ARAVA) 20 MG tablet Take 20 mg by mouth daily.       Marland Kitchen lisinopril (PRINIVIL,ZESTRIL) 5 MG tablet Take 1 tablet (5 mg total) by mouth daily.  30 tablet  11  . metoprolol succinate (TOPROL-XL) 100 MG 24 hr tablet Take 100 mg by  mouth daily. Take with or immediately following a meal.      . predniSONE (DELTASONE) 5 MG tablet Take 5 mg by mouth daily.       . simvastatin (ZOCOR) 40 MG tablet Take 40 mg by mouth daily.      Marland Kitchen warfarin (COUMADIN) 5 MG tablet TAKE 1 TABLET BY MOUTH AS DIRECTED BY COUMADIN CLINIC  30 tablet  3   No current facility-administered medications for this visit.   REVIEW OF SYSTEMS: Valu.Nieves ] denotes positive finding; [  ] denotes negative finding  CARDIOVASCULAR:  [ ]  chest pain   [ ]  chest pressure   [ ]  palpitations   [ ]  orthopnea   [ ]  dyspnea on exertion   [ ]  claudication   [ ]  rest pain   [ ]  DVT   [ ]  phlebitis PULMONARY:   [ ]  productive cough   [ ]  asthma   [ ]  wheezing NEUROLOGIC:   [ ]  weakness  [ ]  paresthesias  [ ]  aphasia  [ ]  amaurosis  [ ]   dizziness HEMATOLOGIC:   [ ]  bleeding problems   [ ]  clotting disorders MUSCULOSKELETAL:  Valu.Nieves ] joint pain   [ ]  joint swelling [ ]  leg swelling GASTROINTESTINAL: [ ]   blood in stool  [ ]   hematemesis GENITOURINARY:  [ ]   dysuria  [ ]   hematuria PSYCHIATRIC:  [ ]  history of major depression INTEGUMENTARY:  [ ]  rashes  [ ]  ulcers CONSTITUTIONAL:  [ ]  fever   [ ]  chills  PHYSICAL EXAM: Filed Vitals:   08/10/13 1420  BP: 127/65  Pulse: 49  Height: 5\' 9"  (1.753 m)  Weight: 147 lb 1.6 Logan (66.724 kg)  SpO2: 97%   Body mass index is 21.71 kg/(m^2). GENERAL: The patient is a well-nourished male, in no acute distress. The vital signs are documented above. CARDIOVASCULAR: There is a regular rate and rhythm. He has a palpable right femoral pulse. Has a diminished left femoral pulse. PULMONARY: There is good air exchange bilaterally without wheezing or rales. ABDOMEN: Soft and non-tender with normal pitched bowel sounds.  MUSCULOSKELETAL: There are no major deformities or cyanosis. NEUROLOGIC: No focal weakness or paresthesias are detected. SKIN: there is a 5 mm wound over his left first metatarsal joint. PSYCHIATRIC: The patient has a normal affect.  DATA:  I have reviewed his arteriogram which shows a focal stenosis of the distal left common femoral artery.  MEDICAL ISSUES:  Atherosclerosis of native arteries of the extremities with ulceration(440.23) The only real option for revascularization is an endarterectomy of the left common femoral artery with vein patch angioplasty. Hopefully this will improve the circulation to the left foot. If the wound in the left foot does not heal I think he will require ray amputation of the left great toe. Currently he would prefer to wait on a dictation of the toe. I have explained that given that he has some exposed bone I think that ultimately it will come to this but given that there is no significant infection currently this is not unreasonable to wait.  He has been cleared from a cardiac standpoint and his surgery is scheduled for 08/23/2013. Discussed the procedure and potential complications in the office today and he is agreeable to proceed.   [place] [facility] Beeper: [phone]

## 2013-08-10 NOTE — Assessment & Plan Note (Signed)
The only real option for revascularization is an endarterectomy of the left common femoral artery with vein patch angioplasty. Hopefully this will improve the circulation to the left foot. If the wound in the left foot does not heal I think he will require ray amputation of the left great toe. Currently he would prefer to wait on a dictation of the toe. I have explained that given that he has some exposed bone I think that ultimately it will come to this but given that there is no significant infection currently this is not unreasonable to wait. He has been cleared from a cardiac standpoint and his surgery is scheduled for 08/23/2013. Discussed the procedure and potential complications in the office today and he is agreeable to proceed.

## 2013-08-10 NOTE — Telephone Encounter (Signed)
[name] called from Dr Scot Dock office stating that Dr Scot Dock wants pt off coumadin for 4 days before surgery on March 17th and that he will need Lovenox bridge . [name] is sending staff message to Dr Burt Knack for Cardiac Clearance and message to Dr Alferd Apa

## 2013-08-11 ENCOUNTER — Encounter (HOSPITAL_COMMUNITY): Payer: Self-pay | Admitting: Pharmacy Technician

## 2013-08-12 ENCOUNTER — Other Ambulatory Visit (HOSPITAL_BASED_OUTPATIENT_CLINIC_OR_DEPARTMENT_OTHER): Payer: Medicare Other

## 2013-08-12 ENCOUNTER — Encounter: Payer: Self-pay | Admitting: Oncology

## 2013-08-12 ENCOUNTER — Ambulatory Visit: Payer: Medicare Other

## 2013-08-12 ENCOUNTER — Telehealth: Payer: Self-pay | Admitting: Oncology

## 2013-08-12 ENCOUNTER — Ambulatory Visit (HOSPITAL_BASED_OUTPATIENT_CLINIC_OR_DEPARTMENT_OTHER): Payer: Medicare Other | Admitting: Oncology

## 2013-08-12 VITALS — BP 107/53 | HR 84 | Temp 98.0°F | Resp 18 | Ht 69.0 in | Wt 146.3 lb

## 2013-08-12 DIAGNOSIS — C3490 Malignant neoplasm of unspecified part of unspecified bronchus or lung: Secondary | ICD-10-CM

## 2013-08-12 DIAGNOSIS — C3410 Malignant neoplasm of upper lobe, unspecified bronchus or lung: Secondary | ICD-10-CM

## 2013-08-12 DIAGNOSIS — C61 Malignant neoplasm of prostate: Secondary | ICD-10-CM

## 2013-08-12 DIAGNOSIS — D649 Anemia, unspecified: Secondary | ICD-10-CM

## 2013-08-12 LAB — CBC WITH DIFFERENTIAL/PLATELET
BASO%: 1.9 % (ref 0.0–2.0)
BASOS ABS: 0.1 10*3/uL (ref 0.0–0.1)
EOS%: 1.2 % (ref 0.0–7.0)
Eosinophils Absolute: 0.1 10*3/uL (ref 0.0–0.5)
HEMATOCRIT: 32.7 % — AB (ref 38.4–49.9)
HEMOGLOBIN: 10.4 g/dL — AB (ref 13.0–17.1)
LYMPH#: 1 10*3/uL (ref 0.9–3.3)
LYMPH%: 13.6 % — ABNORMAL LOW (ref 14.0–49.0)
MCH: 27.5 pg (ref 27.2–33.4)
MCHC: 31.8 g/dL — AB (ref 32.0–36.0)
MCV: 86.5 fL (ref 79.3–98.0)
MONO#: 0.8 10*3/uL (ref 0.1–0.9)
MONO%: 11 % (ref 0.0–14.0)
NEUT#: 5.3 10*3/uL (ref 1.5–6.5)
NEUT%: 72.3 % (ref 39.0–75.0)
Platelets: 486 10*3/uL — ABNORMAL HIGH (ref 140–400)
RBC: 3.78 10*6/uL — ABNORMAL LOW (ref 4.20–5.82)
RDW: 15.9 % — ABNORMAL HIGH (ref 11.0–14.6)
WBC: 7.4 10*3/uL (ref 4.0–10.3)
nRBC: 0 % (ref 0–0)

## 2013-08-12 LAB — COMPREHENSIVE METABOLIC PANEL (CC13)
ALK PHOS: 91 U/L (ref 40–150)
ALT: 11 U/L (ref 0–55)
AST: 15 U/L (ref 5–34)
Albumin: 3.7 g/dL (ref 3.5–5.0)
Anion Gap: 11 mEq/L (ref 3–11)
BUN: 29.2 mg/dL — AB (ref 7.0–26.0)
CO2: 26 mEq/L (ref 22–29)
CREATININE: 1.5 mg/dL — AB (ref 0.7–1.3)
Calcium: 9.8 mg/dL (ref 8.4–10.4)
Chloride: 103 mEq/L (ref 98–109)
Glucose: 103 mg/dl (ref 70–140)
Potassium: 4.6 mEq/L (ref 3.5–5.1)
Sodium: 140 mEq/L (ref 136–145)
Total Bilirubin: 1.04 mg/dL (ref 0.20–1.20)
Total Protein: 7.8 g/dL (ref 6.4–8.3)

## 2013-08-12 MED ORDER — DARBEPOETIN ALFA-POLYSORBATE 300 MCG/0.6ML IJ SOLN: 300.0000 ug | INTRAMUSCULAR | Status: DC

## 2013-08-12 NOTE — Telephone Encounter (Signed)
Gave pt appt for MD and lab on May 2015

## 2013-08-12 NOTE — Progress Notes (Signed)
Hematology and Oncology Follow Up Visit  [name] [id number] [birth year] 80 y.o. 08/12/2013 2:41 PM  CC: Darrick Penna. Leanne Chang, MD  Nicanor Alcon, M.D.  Judeth Cornfield. Scot Dock, M.D.    Principle Diagnosis: This is a 80 year old gentleman with the following diagnoses: 1. 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 2. Multifactorial anemia probably due to chronic disease and rheumatoid arthritis. 3. History of a stage T1c Gleason score 8 adenocarcinoma of the prostate S/P external beam radiation under the care of Dr. Sondra Come completed in 04/2011.  Current therapy:  1. He is on Aranesp at 300 mcg every 4 weeks to keep his hemoglobin above 10. 2. Watchful observation for his lung and prostate cancer.  Interim History:  This is a pleasant gentleman with the above history.  He presents today for a follow-up visit. Since the last visit, he underwent an angiogram to investigate his vascular disease and found to have about a 30% right renal artery stenosis as well as focal stenosis on the left common femoral artery. He is currently under evaluation by Dr. Doren Custard.  He does report some occasional tiredness.  He has not reported any chest pain, has not reported any difficulty breathing, has not reported any cough or deterioration in his health. He continues to be on methotrexate on weekly bases for his rheumatoid arthritis.  No respiratory symptoms noted. No hemoptysis, hematemesis, hematuria, or melena. He reports no new illnesses or hospitalizations. He is reporting lower arthritis symptoms that is managed by low-dose prednisone.  Medications: I have reviewed the patient's current medications.  Current Outpatient Prescriptions  Medication Sig Dispense Refill  . acetaminophen (TYLENOL) 325 MG tablet Take 650 mg by mouth every 6 (six) hours as needed. For pain or fever      . alendronate (FOSAMAX) 70 MG tablet Take 70 mg by mouth every  Sunday. Take with a full glass of water on an empty stomach.      Marland Kitchen aspirin EC 81 MG tablet Take 81 mg by mouth daily.      . [facility]S IRON 325 (65 FE) MG tablet TAKE 1 TABLET BY MOUTH DAILY.  90 tablet  3  . diltiazem (CARDIZEM CD) 120 MG 24 hr capsule Take 120 mg by mouth daily.      . finasteride (PROSCAR) 5 MG tablet Take 5 mg by mouth daily.      . fluticasone (FLONASE) 50 MCG/ACT nasal spray Place 2 sprays into the nose daily as needed. For allergies  16 g  5  . folic acid (FOLVITE) 665 MCG tablet Take 400 mcg by mouth daily.       . furosemide (LASIX) 20 MG tablet Take 1 tablet (20 mg total) by mouth daily.  30 tablet  3  . leflunomide (ARAVA) 20 MG tablet Take 20 mg by mouth daily.       Marland Kitchen lisinopril (PRINIVIL,ZESTRIL) 5 MG tablet Take 1 tablet (5 mg total) by mouth daily.  30 tablet  11  . metoprolol succinate (TOPROL-XL) 100 MG 24 hr tablet Take 100 mg by mouth daily. Take with or immediately following a meal.      . predniSONE (DELTASONE) 5 MG tablet Take 5 mg by mouth daily.       . simvastatin (ZOCOR) 40 MG tablet Take 40 mg by mouth daily.      Marland Kitchen warfarin (COUMADIN) 5 MG tablet Take 5 mg by mouth daily. 5mg  on Monday,  Wednesday and Friday.  All other days take 2.5mg        No current facility-administered medications for this visit.    Allergies: No Known Allergies  Past Medical History, Surgical history, Social history, and Family History were reviewed and updated.  Review of Systems: Constitutional:  Negative for fever, chills, night sweats, anorexia, weight loss, pain.  Remaining ROS negative.  Physical Exam: Blood pressure 107/53, pulse 84, temperature 98 F (36.7 C), temperature source Oral, resp. rate 18, height 5\' 9"  (1.753 m), weight 146 lb 4.8 oz (66.361 kg), SpO2 96.00%. ECOG: 1 General appearance: alert Head: Normocephalic, without obvious abnormality, atraumatic Neck: no adenopathy, no carotid bruit, no JVD, supple, symmetrical, trachea midline and thyroid not  enlarged, symmetric, no tenderness/mass/nodules Lymph nodes: Cervical, supraclavicular, and axillary nodes normal. Heart:regular rate and rhythm, S1, S2 normal, no murmur, click, rub or gallop Lung:chest clear, no wheezing, rales, normal symmetric air entry Abdomen: soft, non-tender, without masses or organomegaly EXT:no erythema, induration, or nodules  Lab Results: Lab Results  Component Value Date   WBC 7.4 08/12/2013   HGB 10.4* 08/12/2013   HCT 32.7* 08/12/2013   MCV 86.5 08/12/2013   PLT 486* 08/12/2013     Chemistry      Component Value Date/Time   NA 140 07/11/2013 0656   NA 141 12/16/2012 0926   NA 141 10/22/2011 0944   K 3.9 07/11/2013 0656   K 3.8 12/16/2012 0926   K 4.8* 10/22/2011 0944   CL 102 07/11/2013 0656   CL 111* 06/11/2012 1102   CL 98 10/22/2011 0944   CO2 26 07/05/2013 1548   CO2 27 12/16/2012 0926   CO2 26 10/22/2011 0944   BUN 34* 07/11/2013 0656   BUN 22.0 12/16/2012 0926   BUN 29* 10/22/2011 0944   CREATININE 1.60* 07/11/2013 0656   CREATININE 1.2 12/16/2012 0926   CREATININE 1.5* 10/22/2011 0944      Component Value Date/Time   CALCIUM 9.4 07/05/2013 1548   CALCIUM 9.2 12/16/2012 0926   CALCIUM 8.8 10/22/2011 0944   CALCIUM 9.4 10/14/2007 0110   ALKPHOS 65 12/16/2012 0926   ALKPHOS 76 10/22/2011 0944   ALKPHOS 68 07/03/2011 1323   AST 13 12/16/2012 0926   AST 23 10/22/2011 0944   AST 19 07/03/2011 1323   ALT 10 12/16/2012 0926   ALT 14 10/22/2011 0944   ALT 14 07/03/2011 1323   BILITOT 0.76 12/16/2012 0926   BILITOT 1.10 10/22/2011 0944   BILITOT 0.8 07/03/2011 1323      Impression and Plan: .  A 80 year old gentleman with the following issues: 1. Stage IB lung cancer status post surgical resection.  CT scan from 06/2012 continues to have no evidence of recurrent disease.  Continue on active surveillance. He will have another CT scan as needed. I will repeat a CXR instead in 12/2013.  2. Anemia, multifactorial.  Continue to receive Aranesp to keep his hemoglobin above 10.   Aranesp will be 300 mcg every 8 weeks instead of 4 weeks given the stability of his Hgb. Aranesp injection was held today due to Hgb of 10.3. 3. T1c Gleason score 8 adenocarcinoma of the prostate with a PSA of 14.2.  He is S/P  radiation therapy without evidence of relapse. PSA is followed by Urology. 4. Follow-up. In 2 months.  DPOEUM,PNTIR                  3/6/20152:41 PM

## 2013-08-16 ENCOUNTER — Ambulatory Visit (INDEPENDENT_AMBULATORY_CARE_PROVIDER_SITE_OTHER): Payer: Medicare Other | Admitting: Pharmacist

## 2013-08-16 DIAGNOSIS — I4891 Unspecified atrial fibrillation: Secondary | ICD-10-CM

## 2013-08-16 DIAGNOSIS — Z5181 Encounter for therapeutic drug level monitoring: Secondary | ICD-10-CM

## 2013-08-16 DIAGNOSIS — Z8679 Personal history of other diseases of the circulatory system: Secondary | ICD-10-CM

## 2013-08-16 LAB — POCT INR: INR: 2.8

## 2013-08-16 MED ORDER — ENOXAPARIN SODIUM 80 MG/0.8ML ~~LOC~~ SOLN: 80.0000 mg | SUBCUTANEOUS | Status: DC

## 2013-08-16 NOTE — Patient Instructions (Addendum)
3/13 No Coumadin, No Lovenox  3/14 No Coumadin, Lovenox 80 mg at [facility] 3/15 No Coumadin, Lovenox 80mg  at [facility] 3/16 No Coumadin, No Lovenox  3/17 Procedure   Follow Dr. Nicole Cella directions on went to restart Coumadin and Lovenox; or call clinic at [phone] When you restart, take an extra half a tablet for the first two days with lovenox. We will recheck INR on August 29, 2013

## 2013-08-18 ENCOUNTER — Encounter (HOSPITAL_COMMUNITY)
Admission: RE | Admit: 2013-08-18 | Discharge: 2013-08-18 | Disposition: A | Discharge status: Home / Self Care | Payer: Medicare Other | Source: Ambulatory Visit | Attending: Vascular Surgery | Admitting: Vascular Surgery

## 2013-08-18 ENCOUNTER — Encounter (HOSPITAL_COMMUNITY)
Admission: RE | Admit: 2013-08-18 | Discharge: 2013-08-18 | Disposition: A | Discharge status: Home / Self Care | Payer: Medicare Other | Source: Ambulatory Visit | Attending: Anesthesiology | Admitting: Anesthesiology

## 2013-08-18 ENCOUNTER — Encounter (HOSPITAL_COMMUNITY): Payer: Self-pay

## 2013-08-18 DIAGNOSIS — Z01812 Encounter for preprocedural laboratory examination: Secondary | ICD-10-CM | POA: Diagnosis not present

## 2013-08-18 DIAGNOSIS — Z01818 Encounter for other preprocedural examination: Secondary | ICD-10-CM | POA: Diagnosis present

## 2013-08-18 HISTORY — DX: Shortness of breath: R06.02

## 2013-08-18 LAB — COMPREHENSIVE METABOLIC PANEL
ALBUMIN: 3.7 g/dL (ref 3.5–5.2)
ALK PHOS: 96 U/L (ref 39–117)
ALT: 9 U/L (ref 0–53)
AST: 13 U/L (ref 0–37)
BUN: 27 mg/dL — AB (ref 6–23)
CALCIUM: 9.9 mg/dL (ref 8.4–10.5)
CO2: 25 mEq/L (ref 19–32)
Chloride: 98 mEq/L (ref 96–112)
Creatinine, Ser: 1.44 mg/dL — ABNORMAL HIGH (ref 0.50–1.35)
GFR calc non Af Amer: 45 mL/min — ABNORMAL LOW (ref >90)
GFR, EST AFRICAN AMERICAN: 52 mL/min — AB (ref >90)
Glucose, Bld: 100 mg/dL — ABNORMAL HIGH (ref 70–99)
POTASSIUM: 4.7 meq/L (ref 3.7–5.3)
Sodium: 139 mEq/L (ref 137–147)
TOTAL PROTEIN: 7.7 g/dL (ref 6.0–8.3)
Total Bilirubin: 0.9 mg/dL (ref 0.3–1.2)

## 2013-08-18 LAB — CBC
HEMATOCRIT: 33.8 % — AB (ref 39.0–52.0)
Hemoglobin: 10.9 g/dL — ABNORMAL LOW (ref 13.0–17.0)
MCH: 27.9 pg (ref 26.0–34.0)
MCHC: 32.2 g/dL (ref 30.0–36.0)
MCV: 86.4 fL (ref 78.0–100.0)
Platelets: 504 10*3/uL — ABNORMAL HIGH (ref 150–400)
RBC: 3.91 MIL/uL — ABNORMAL LOW (ref 4.22–5.81)
RDW: 16 % — AB (ref 11.5–15.5)
WBC: 9.1 10*3/uL (ref 4.0–10.5)

## 2013-08-18 LAB — APTT: aPTT: 32 seconds (ref 24–37)

## 2013-08-18 LAB — URINALYSIS, ROUTINE W REFLEX MICROSCOPIC
BILIRUBIN URINE: NEGATIVE
Glucose, UA: NEGATIVE mg/dL
Hgb urine dipstick: NEGATIVE
Ketones, ur: NEGATIVE mg/dL
Leukocytes, UA: NEGATIVE
NITRITE: NEGATIVE
PROTEIN: NEGATIVE mg/dL
SPECIFIC GRAVITY, URINE: 1.018 (ref 1.005–1.030)
UROBILINOGEN UA: 1 mg/dL (ref 0.0–1.0)
pH: 6 (ref 5.0–8.0)

## 2013-08-18 LAB — PROTIME-INR
INR: 2.59 — ABNORMAL HIGH (ref 0.00–1.49)
Prothrombin Time: 26.9 seconds — ABNORMAL HIGH (ref 11.6–15.2)

## 2013-08-18 LAB — TYPE AND SCREEN
ABO/RH(D): A POS
Antibody Screen: NEGATIVE

## 2013-08-18 LAB — SURGICAL PCR SCREEN
MRSA, PCR: NEGATIVE
STAPHYLOCOCCUS AUREUS: NEGATIVE

## 2013-08-18 NOTE — Pre-Procedure Instructions (Signed)
[name]  08/18/2013   Your procedure is scheduled on:  Tuesday March 17 th at 1103 AM  Report to [facility] "A" at [phone] AM.  Call this number if you have problems the morning of surgery: [phone]   Remember:   Do not eat food or drink liquids after midnight Monday 08/22/13.  Take these medicines the morning of surgery with A SIP OF WATER:  Diltiazem(Cardizem CD), and Metoprolol (Toprol-XL),    Take Coumadin and Lovenox as instructed by Coumadin clinic.  Do not wear jewelry.  Do not wear lotions, powders, or colognes. You may not wear deodorant.   Men may shave face and neck.  Do not bring valuables to the hospital.  [facility] is not responsible  for any belongings or valuables.               Contacts, dentures or bridgework may not be worn into surgery.  Leave suitcase in the car. After surgery it may be brought to your room.  For patients admitted to the hospital, discharge time is determined by your  treatment team.               Patients discharged the day of surgery will not be allowed to drive home.    Special Instructions: [place] - Preparing for Surgery  Before surgery, you can play an important role.  Because skin is not sterile, your skin needs to be as free of germs as possible.  You can reduce the number of germs on you skin by washing with CHG (chlorahexidine gluconate) soap before surgery.  CHG is an antiseptic cleaner which kills germs and bonds with the skin to continue killing germs even after washing.  Please DO NOT use if you have an allergy to CHG or antibacterial soaps.  If your skin becomes reddened/irritated stop using the CHG and inform your nurse when you arrive at Short Stay.  Do not shave (including legs and underarms) for at least 48 hours prior to the first CHG shower.  You may shave your face.  Please follow these instructions carefully:   1.  Shower with CHG Soap the night before surgery and the                                 morning of Surgery.  2.  If you choose to wash your hair, wash your hair first as usual with your       normal shampoo.  3.  After you shampoo, rinse your hair and body thoroughly to remove the                      Shampoo.  4.  Use CHG as you would any other liquid soap.  You can apply chg directly       to the skin and wash gently with scrungie or a clean washcloth.  5.  Apply the CHG Soap to your body ONLY FROM THE NECK DOWN.        Do not use on open wounds or open sores.  Avoid contact with your eyes,       ears, mouth and genitals (private parts).  Wash genitals (private parts)       with your normal soap.  6.  Wash thoroughly, paying special attention to the area where your surgery        will be performed.  7.  Thoroughly rinse your body with warm water from the neck down.  8.  DO NOT shower/wash with your normal soap after using and rinsing off       the CHG Soap.  9.  Pat yourself dry with a clean towel.            10.  Wear clean pajamas.            11.  Place clean sheets on your bed the night of your first shower and do not        sleep with pets.  Day of Surgery  Do not apply any lotions/deoderants the morning of surgery.  Please wear clean clothes to the hospital/surgery center.      Please read over the following fact sheets that you were given: Pain Booklet, Coughing and Deep Breathing, Blood Transfusion Information, MRSA Information and Surgical Site Infection Prevention

## 2013-08-19 NOTE — Progress Notes (Signed)
Anesthesia Chart Review:  Patient is a 80 year old male scheduled for left CFA endarterectomy with VPA on 08/23/13 by Dr. Scot Dock.  History includes PAD s/p right iliac and SFA PTA, former smoker, squamous cell lung carcinoma stage 1B s/p RU lobectomy 2010, CAD/MI s/p CABG '03, ischemic cardiomyopathy, atrial fibrillation/flutter s/p RFCA '06, congestive heart failure, chronic kidney disease stage II, rheumatoid arthritis (on prednisone), prostate cancer s/p external beam radiation '12, CVA '08, carotid artery stenosis (40-59% bilateral ICA stenosis by 02/01/13 duplex). PCP is Dr. Phoebe Sharps. Oncologist is Dr. Alen Blew. Cardiologist is Dr. Burt Knack who felt patient was acceptable risk, but recommended a Lovenox bridge while off Coumadin for surgery.  Nuclear stress test on 05/20/13 showed: Overall Impression: Intermediate risk stress nuclear study Inferolateral wall infarct from apex to base with no ischemia. Lateral wall dyskinesis on surface images with EF 40%. LV Ejection Fraction: 40%. LV Wall Motion: Moderately decreased function with inferolateral wall dyskinesis.  Report was reviewed by Dr. Burt Knack who recommended continued medical therapy.  EKG on 07/20/13 showed afib at 76 bpm.  HR was 66 bpm at PAT. Both Toprol and Cardizem are listed on his medication list.  Echo on 04/11/13 showed: - Left ventricle: The cavity size was normal. Wall thickness was normal. Systolic function was mildly to moderately reduced. The estimated ejection fraction was in the range of 40% to 45%. Diffuse hypokinesis. - Mitral valve: Calcified annulus. Mild regurgitation. - Left atrium: The atrium was mildly to moderately dilated. - Right atrium: The atrium was mildly to moderately dilated.  LHC (05/12/2005): pLAD 40, pCFX occluded, dRCA 90; S-RCA ok, S-OM/PLB ok, L-LAD ok but atrophic. Med Rx.  Preoperative CXR and labs noted.  Repeat PT/PTT on arrival.   If follow-up labs acceptable and no acute changes then plan to  proceed.  [facility] Short Stay Center/Anesthesiology Phone [phone] 08/19/2013 1:32 PM

## 2013-08-22 MED ORDER — DEXTROSE 5 % IV SOLN
1.5000 g | INTRAVENOUS | Status: AC
  Administered 2013-08-23: 1.5 g via INTRAVENOUS
  Filled 2013-08-22 (×2): qty 1.5

## 2013-08-22 NOTE — Progress Notes (Signed)
Pt already made aware to report at 0730 on Tuesday 08/23/13 for procedure.

## 2013-08-23 ENCOUNTER — Encounter (HOSPITAL_COMMUNITY)
Admission: RE | Disposition: A | Discharge status: Home / Self Care | Payer: Self-pay | Source: Ambulatory Visit | Attending: Vascular Surgery

## 2013-08-23 ENCOUNTER — Inpatient Hospital Stay (HOSPITAL_COMMUNITY)
Admission: RE | Admit: 2013-08-23 | Discharge: 2013-08-24 | DRG: 253 | Disposition: A | Discharge status: Home / Self Care | Payer: Medicare Other | Source: Ambulatory Visit | Attending: Vascular Surgery | Admitting: Vascular Surgery

## 2013-08-23 ENCOUNTER — Encounter (HOSPITAL_COMMUNITY): Payer: Self-pay | Admitting: Anesthesiology

## 2013-08-23 ENCOUNTER — Inpatient Hospital Stay (HOSPITAL_COMMUNITY): Payer: Medicare Other | Admitting: Anesthesiology

## 2013-08-23 ENCOUNTER — Encounter (HOSPITAL_COMMUNITY): Payer: Medicare Other | Admitting: Vascular Surgery

## 2013-08-23 DIAGNOSIS — D649 Anemia, unspecified: Secondary | ICD-10-CM | POA: Diagnosis present

## 2013-08-23 DIAGNOSIS — Z7982 Long term (current) use of aspirin: Secondary | ICD-10-CM

## 2013-08-23 DIAGNOSIS — Z79899 Other long term (current) drug therapy: Secondary | ICD-10-CM

## 2013-08-23 DIAGNOSIS — N182 Chronic kidney disease, stage 2 (mild): Secondary | ICD-10-CM | POA: Diagnosis present

## 2013-08-23 DIAGNOSIS — M899 Disorder of bone, unspecified: Secondary | ICD-10-CM | POA: Diagnosis present

## 2013-08-23 DIAGNOSIS — M069 Rheumatoid arthritis, unspecified: Secondary | ICD-10-CM | POA: Diagnosis present

## 2013-08-23 DIAGNOSIS — IMO0002 Reserved for concepts with insufficient information to code with codable children: Secondary | ICD-10-CM

## 2013-08-23 DIAGNOSIS — I6529 Occlusion and stenosis of unspecified carotid artery: Secondary | ICD-10-CM | POA: Diagnosis present

## 2013-08-23 DIAGNOSIS — Z87891 Personal history of nicotine dependence: Secondary | ICD-10-CM

## 2013-08-23 DIAGNOSIS — Z8546 Personal history of malignant neoplasm of prostate: Secondary | ICD-10-CM

## 2013-08-23 DIAGNOSIS — Z8673 Personal history of transient ischemic attack (TIA), and cerebral infarction without residual deficits: Secondary | ICD-10-CM

## 2013-08-23 DIAGNOSIS — L98499 Non-pressure chronic ulcer of skin of other sites with unspecified severity: Principal | ICD-10-CM | POA: Diagnosis present

## 2013-08-23 DIAGNOSIS — I5022 Chronic systolic (congestive) heart failure: Secondary | ICD-10-CM | POA: Diagnosis present

## 2013-08-23 DIAGNOSIS — I739 Peripheral vascular disease, unspecified: Principal | ICD-10-CM | POA: Diagnosis present

## 2013-08-23 DIAGNOSIS — Z85118 Personal history of other malignant neoplasm of bronchus and lung: Secondary | ICD-10-CM

## 2013-08-23 DIAGNOSIS — I509 Heart failure, unspecified: Secondary | ICD-10-CM | POA: Diagnosis present

## 2013-08-23 DIAGNOSIS — I4891 Unspecified atrial fibrillation: Secondary | ICD-10-CM | POA: Diagnosis present

## 2013-08-23 DIAGNOSIS — I70209 Unspecified atherosclerosis of native arteries of extremities, unspecified extremity: Secondary | ICD-10-CM | POA: Diagnosis present

## 2013-08-23 DIAGNOSIS — L97509 Non-pressure chronic ulcer of other part of unspecified foot with unspecified severity: Secondary | ICD-10-CM | POA: Diagnosis present

## 2013-08-23 DIAGNOSIS — Z7901 Long term (current) use of anticoagulants: Secondary | ICD-10-CM

## 2013-08-23 DIAGNOSIS — M949 Disorder of cartilage, unspecified: Secondary | ICD-10-CM

## 2013-08-23 DIAGNOSIS — E785 Hyperlipidemia, unspecified: Secondary | ICD-10-CM | POA: Diagnosis present

## 2013-08-23 DIAGNOSIS — I252 Old myocardial infarction: Secondary | ICD-10-CM

## 2013-08-23 DIAGNOSIS — I2510 Atherosclerotic heart disease of native coronary artery without angina pectoris: Secondary | ICD-10-CM | POA: Diagnosis present

## 2013-08-23 HISTORY — PX: ENDARTERECTOMY FEMORAL: SHX5804

## 2013-08-23 HISTORY — PX: PATCH ANGIOPLASTY: SHX6230

## 2013-08-23 LAB — PROTIME-INR
INR: 1.55 — AB (ref 0.00–1.49)
Prothrombin Time: 18.2 seconds — ABNORMAL HIGH (ref 11.6–15.2)

## 2013-08-23 LAB — CBC
HCT: 30.8 % — ABNORMAL LOW (ref 39.0–52.0)
HEMOGLOBIN: 10.1 g/dL — AB (ref 13.0–17.0)
MCH: 28.1 pg (ref 26.0–34.0)
MCHC: 32.8 g/dL (ref 30.0–36.0)
MCV: 85.8 fL (ref 78.0–100.0)
Platelets: 450 10*3/uL — ABNORMAL HIGH (ref 150–400)
RBC: 3.59 MIL/uL — ABNORMAL LOW (ref 4.22–5.81)
RDW: 16.3 % — ABNORMAL HIGH (ref 11.5–15.5)
WBC: 8 10*3/uL (ref 4.0–10.5)

## 2013-08-23 LAB — CREATININE, SERUM
Creatinine, Ser: 1.45 mg/dL — ABNORMAL HIGH (ref 0.50–1.35)
GFR, EST AFRICAN AMERICAN: 51 mL/min — AB (ref >90)
GFR, EST NON AFRICAN AMERICAN: 44 mL/min — AB (ref >90)

## 2013-08-23 LAB — APTT: APTT: 33 s (ref 24–37)

## 2013-08-23 SURGERY — ENDARTERECTOMY, FEMORAL
Anesthesia: General | Site: Groin | Laterality: Left

## 2013-08-23 MED ORDER — FENTANYL CITRATE 0.05 MG/ML IJ SOLN
INTRAMUSCULAR | Status: DC | PRN
  Administered 2013-08-23 (×3): 50 ug via INTRAVENOUS

## 2013-08-23 MED ORDER — HYDRALAZINE HCL 20 MG/ML IJ SOLN: 10.0000 mg | INTRAMUSCULAR | Status: DC | PRN

## 2013-08-23 MED ORDER — PHENYLEPHRINE HCL 10 MG/ML IJ SOLN
INTRAMUSCULAR | Status: DC | PRN
Start: 2013-08-23 — End: 2013-08-23
  Administered 2013-08-23: 80 ug via INTRAVENOUS

## 2013-08-23 MED ORDER — FENTANYL CITRATE 0.05 MG/ML IJ SOLN
INTRAMUSCULAR | Status: AC
  Filled 2013-08-23: qty 5

## 2013-08-23 MED ORDER — METOPROLOL TARTRATE 1 MG/ML IV SOLN: 2.0000 mg | INTRAVENOUS | Status: DC | PRN

## 2013-08-23 MED ORDER — PHENOL 1.4 % MT LIQD
1.0000 | OROMUCOSAL | Status: DC | PRN
  Filled 2013-08-23: qty 177

## 2013-08-23 MED ORDER — 0.9 % SODIUM CHLORIDE (POUR BTL) OPTIME
TOPICAL | Status: DC | PRN
  Administered 2013-08-23 (×2): 1000 mL

## 2013-08-23 MED ORDER — ONDANSETRON HCL 4 MG/2ML IJ SOLN
INTRAMUSCULAR | Status: AC
  Filled 2013-08-23: qty 2

## 2013-08-23 MED ORDER — GLYCOPYRROLATE 0.2 MG/ML IJ SOLN
INTRAMUSCULAR | Status: DC | PRN
  Administered 2013-08-23: 0.6 mg via INTRAVENOUS

## 2013-08-23 MED ORDER — ENOXAPARIN SODIUM 80 MG/0.8ML ~~LOC~~ SOLN
80.0000 mg | SUBCUTANEOUS | Status: DC
  Administered 2013-08-23: 80 mg via SUBCUTANEOUS
  Filled 2013-08-23 (×2): qty 0.8

## 2013-08-23 MED ORDER — STERILE WATER FOR INJECTION IJ SOLN
INTRAMUSCULAR | Status: AC
  Filled 2013-08-23: qty 10

## 2013-08-23 MED ORDER — LIDOCAINE HCL (CARDIAC) 20 MG/ML IV SOLN
INTRAVENOUS | Status: DC | PRN
  Administered 2013-08-23: 80 mg via INTRAVENOUS

## 2013-08-23 MED ORDER — WARFARIN SODIUM 5 MG PO TABS
5.0000 mg | ORAL_TABLET | Freq: Every day | ORAL | Status: DC
  Administered 2013-08-23: 5 mg via ORAL
  Filled 2013-08-23 (×2): qty 1

## 2013-08-23 MED ORDER — SODIUM CHLORIDE 0.9 % IV SOLN: 500.0000 mL | Freq: Once | INTRAVENOUS | Status: AC | PRN

## 2013-08-23 MED ORDER — GLYCOPYRROLATE 0.2 MG/ML IJ SOLN
INTRAMUSCULAR | Status: AC
  Filled 2013-08-23: qty 3

## 2013-08-23 MED ORDER — FENTANYL CITRATE 0.05 MG/ML IJ SOLN
INTRAMUSCULAR | Status: AC
  Filled 2013-08-23: qty 2

## 2013-08-23 MED ORDER — FINASTERIDE 5 MG PO TABS
5.0000 mg | ORAL_TABLET | Freq: Every day | ORAL | Status: DC
  Administered 2013-08-23 – 2013-08-24 (×2): 5 mg via ORAL
  Filled 2013-08-23 (×2): qty 1

## 2013-08-23 MED ORDER — HEPARIN SODIUM (PORCINE) 1000 UNIT/ML IJ SOLN
INTRAMUSCULAR | Status: DC | PRN
  Administered 2013-08-23: 6000 [IU] via INTRAVENOUS

## 2013-08-23 MED ORDER — ARTIFICIAL TEARS OP OINT
TOPICAL_OINTMENT | OPHTHALMIC | Status: DC | PRN
  Administered 2013-08-23: 1 via OPHTHALMIC

## 2013-08-23 MED ORDER — LISINOPRIL 5 MG PO TABS
5.0000 mg | ORAL_TABLET | Freq: Every day | ORAL | Status: DC
  Administered 2013-08-23: 5 mg via ORAL
  Filled 2013-08-23 (×2): qty 1

## 2013-08-23 MED ORDER — PHENYLEPHRINE HCL 10 MG/ML IJ SOLN
10.0000 mg | INTRAVENOUS | Status: DC | PRN
  Administered 2013-08-23: 10 ug/min via INTRAVENOUS

## 2013-08-23 MED ORDER — ACETAMINOPHEN 325 MG PO TABS
650.0000 mg | ORAL_TABLET | Freq: Four times a day (QID) | ORAL | Status: DC | PRN
Start: 2013-08-23 — End: 2013-08-24

## 2013-08-23 MED ORDER — ROCURONIUM BROMIDE 50 MG/5ML IV SOLN
INTRAVENOUS | Status: AC
  Filled 2013-08-23: qty 1

## 2013-08-23 MED ORDER — METOPROLOL SUCCINATE ER 100 MG PO TB24
100.0000 mg | ORAL_TABLET | Freq: Every day | ORAL | Status: DC
  Administered 2013-08-24: 100 mg via ORAL
  Filled 2013-08-23: qty 1

## 2013-08-23 MED ORDER — PROPOFOL 10 MG/ML IV BOLUS
INTRAVENOUS | Status: DC | PRN
  Administered 2013-08-23: 130 mg via INTRAVENOUS

## 2013-08-23 MED ORDER — WARFARIN - PHYSICIAN DOSING INPATIENT
Freq: Every day | Status: DC
  Administered 2013-08-23: 18:00:00

## 2013-08-23 MED ORDER — GUAIFENESIN-DM 100-10 MG/5ML PO SYRP: 15.0000 mL | ORAL_SOLUTION | ORAL | Status: DC | PRN

## 2013-08-23 MED ORDER — PREDNISONE 5 MG PO TABS
5.0000 mg | ORAL_TABLET | Freq: Every day | ORAL | Status: DC
  Administered 2013-08-23 – 2013-08-24 (×2): 5 mg via ORAL
  Filled 2013-08-23 (×2): qty 1

## 2013-08-23 MED ORDER — NEOSTIGMINE METHYLSULFATE 1 MG/ML IJ SOLN
INTRAMUSCULAR | Status: DC | PRN
  Administered 2013-08-23: 5 mg via INTRAVENOUS

## 2013-08-23 MED ORDER — SODIUM CHLORIDE 0.9 % IV SOLN
INTRAVENOUS | Status: DC
  Administered 2013-08-23 (×2): via INTRAVENOUS

## 2013-08-23 MED ORDER — ONDANSETRON HCL 4 MG/2ML IJ SOLN
INTRAMUSCULAR | Status: DC | PRN
  Administered 2013-08-23: 4 mg via INTRAVENOUS

## 2013-08-23 MED ORDER — METHYLPREDNISOLONE SODIUM SUCC 125 MG IJ SOLR
INTRAMUSCULAR | Status: DC | PRN
  Administered 2013-08-23: 62.5 mg via INTRAVENOUS

## 2013-08-23 MED ORDER — DOPAMINE-DEXTROSE 3.2-5 MG/ML-% IV SOLN
3.0000 ug/kg/min | INTRAVENOUS | Status: DC
  Filled 2013-08-23: qty 250

## 2013-08-23 MED ORDER — FLUTICASONE PROPIONATE 50 MCG/ACT NA SUSP
2.0000 | Freq: Every day | NASAL | Status: DC | PRN
  Filled 2013-08-23: qty 16

## 2013-08-23 MED ORDER — DILTIAZEM HCL ER COATED BEADS 120 MG PO CP24
120.0000 mg | ORAL_CAPSULE | Freq: Every day | ORAL | Status: DC
  Administered 2013-08-24: 120 mg via ORAL
  Filled 2013-08-23: qty 1

## 2013-08-23 MED ORDER — ALENDRONATE SODIUM 70 MG PO TABS: 70.0000 mg | ORAL_TABLET | ORAL | Status: DC

## 2013-08-23 MED ORDER — OXYCODONE HCL 5 MG/5ML PO SOLN: 5.0000 mg | Freq: Once | ORAL | Status: DC | PRN

## 2013-08-23 MED ORDER — FOLIC ACID 1 MG PO TABS
1.0000 mg | ORAL_TABLET | Freq: Every day | ORAL | Status: DC
  Administered 2013-08-23 – 2013-08-24 (×2): 1 mg via ORAL
  Filled 2013-08-23 (×2): qty 1

## 2013-08-23 MED ORDER — FUROSEMIDE 20 MG PO TABS
20.0000 mg | ORAL_TABLET | Freq: Every day | ORAL | Status: DC
  Filled 2013-08-23 (×2): qty 1

## 2013-08-23 MED ORDER — FOLIC ACID 400 MCG PO TABS: 400.0000 ug | ORAL_TABLET | Freq: Every day | ORAL | Status: DC

## 2013-08-23 MED ORDER — PROTAMINE SULFATE 10 MG/ML IV SOLN
INTRAVENOUS | Status: AC
  Filled 2013-08-23: qty 5

## 2013-08-23 MED ORDER — PROTAMINE SULFATE 10 MG/ML IV SOLN
INTRAVENOUS | Status: DC | PRN
  Administered 2013-08-23: 30 mg via INTRAVENOUS

## 2013-08-23 MED ORDER — DOCUSATE SODIUM 100 MG PO CAPS
100.0000 mg | ORAL_CAPSULE | Freq: Every day | ORAL | Status: DC
  Filled 2013-08-23: qty 1

## 2013-08-23 MED ORDER — LIDOCAINE HCL (CARDIAC) 20 MG/ML IV SOLN
INTRAVENOUS | Status: AC
  Filled 2013-08-23: qty 5

## 2013-08-23 MED ORDER — THROMBIN 20000 UNITS EX SOLR
CUTANEOUS | Status: AC
  Filled 2013-08-23: qty 20000

## 2013-08-23 MED ORDER — SIMVASTATIN 40 MG PO TABS
40.0000 mg | ORAL_TABLET | Freq: Every day | ORAL | Status: DC
  Administered 2013-08-23: 40 mg via ORAL
  Filled 2013-08-23 (×2): qty 1

## 2013-08-23 MED ORDER — PROPOFOL 10 MG/ML IV BOLUS
INTRAVENOUS | Status: AC
  Filled 2013-08-23: qty 20

## 2013-08-23 MED ORDER — CHLORHEXIDINE GLUCONATE 4 % EX LIQD
60.0000 mL | Freq: Once | CUTANEOUS | Status: DC
  Filled 2013-08-23: qty 60

## 2013-08-23 MED ORDER — ASPIRIN EC 81 MG PO TBEC
81.0000 mg | DELAYED_RELEASE_TABLET | Freq: Every day | ORAL | Status: DC
  Administered 2013-08-23 – 2013-08-24 (×2): 81 mg via ORAL
  Filled 2013-08-23 (×2): qty 1

## 2013-08-23 MED ORDER — ARTIFICIAL TEARS OP OINT
TOPICAL_OINTMENT | OPHTHALMIC | Status: AC
  Filled 2013-08-23: qty 3.5

## 2013-08-23 MED ORDER — LABETALOL HCL 5 MG/ML IV SOLN
10.0000 mg | INTRAVENOUS | Status: DC | PRN
  Filled 2013-08-23: qty 4

## 2013-08-23 MED ORDER — ONDANSETRON HCL 4 MG/2ML IJ SOLN: 4.0000 mg | Freq: Four times a day (QID) | INTRAMUSCULAR | Status: DC | PRN

## 2013-08-23 MED ORDER — PANTOPRAZOLE SODIUM 40 MG PO TBEC
40.0000 mg | DELAYED_RELEASE_TABLET | Freq: Every day | ORAL | Status: DC
Start: 2013-08-23 — End: 2013-08-24
  Administered 2013-08-24: 40 mg via ORAL
  Filled 2013-08-23: qty 1

## 2013-08-23 MED ORDER — EPHEDRINE SULFATE 50 MG/ML IJ SOLN
INTRAMUSCULAR | Status: AC
  Filled 2013-08-23: qty 1

## 2013-08-23 MED ORDER — MAGNESIUM SULFATE 40 MG/ML IJ SOLN
2.0000 g | Freq: Every day | INTRAMUSCULAR | Status: DC | PRN
  Filled 2013-08-23: qty 50

## 2013-08-23 MED ORDER — NEOSTIGMINE METHYLSULFATE 1 MG/ML IJ SOLN
INTRAMUSCULAR | Status: AC
  Filled 2013-08-23: qty 10

## 2013-08-23 MED ORDER — KCL IN DEXTROSE-NACL 20-5-0.45 MEQ/L-%-% IV SOLN
INTRAVENOUS | Status: AC
  Filled 2013-08-23: qty 1000

## 2013-08-23 MED ORDER — KCL IN DEXTROSE-NACL 20-5-0.45 MEQ/L-%-% IV SOLN
INTRAVENOUS | Status: DC
  Administered 2013-08-23: 16:00:00 via INTRAVENOUS
  Administered 2013-08-23: 75 mL/h via INTRAVENOUS
  Administered 2013-08-24: 05:00:00 via INTRAVENOUS
  Filled 2013-08-23 (×3): qty 1000

## 2013-08-23 MED ORDER — HEPARIN SODIUM (PORCINE) 1000 UNIT/ML IJ SOLN
INTRAMUSCULAR | Status: AC
  Filled 2013-08-23: qty 1

## 2013-08-23 MED ORDER — FENTANYL CITRATE 0.05 MG/ML IJ SOLN: 50.0000 ug | Freq: Once | INTRAMUSCULAR | Status: DC

## 2013-08-23 MED ORDER — OXYCODONE-ACETAMINOPHEN 5-325 MG PO TABS
1.0000 | ORAL_TABLET | ORAL | Status: DC | PRN
  Administered 2013-08-23: 1 via ORAL
  Filled 2013-08-23: qty 1

## 2013-08-23 MED ORDER — ALUM & MAG HYDROXIDE-SIMETH 200-200-20 MG/5ML PO SUSP: 15.0000 mL | ORAL | Status: DC | PRN

## 2013-08-23 MED ORDER — HYDROMORPHONE HCL PF 1 MG/ML IJ SOLN: 0.5000 mg | INTRAMUSCULAR | Status: DC | PRN

## 2013-08-23 MED ORDER — FERROUS SULFATE 325 (65 FE) MG PO TABS
325.0000 mg | ORAL_TABLET | Freq: Every day | ORAL | Status: DC
  Administered 2013-08-24: 325 mg via ORAL
  Filled 2013-08-23 (×3): qty 1

## 2013-08-23 MED ORDER — DEXTROSE 5 % IV SOLN
1.5000 g | Freq: Two times a day (BID) | INTRAVENOUS | Status: AC
  Administered 2013-08-23 – 2013-08-24 (×2): 1.5 g via INTRAVENOUS
  Filled 2013-08-23 (×2): qty 1.5

## 2013-08-23 MED ORDER — MIDAZOLAM HCL 2 MG/2ML IJ SOLN: 1.0000 mg | INTRAMUSCULAR | Status: DC | PRN

## 2013-08-23 MED ORDER — POTASSIUM CHLORIDE CRYS ER 20 MEQ PO TBCR: 20.0000 meq | EXTENDED_RELEASE_TABLET | Freq: Every day | ORAL | Status: DC | PRN

## 2013-08-23 MED ORDER — OXYCODONE HCL 5 MG PO TABS: 5.0000 mg | ORAL_TABLET | Freq: Once | ORAL | Status: DC | PRN

## 2013-08-23 MED ORDER — ROCURONIUM BROMIDE 100 MG/10ML IV SOLN
INTRAVENOUS | Status: DC | PRN
  Administered 2013-08-23: 40 mg via INTRAVENOUS
  Administered 2013-08-23: 10 mg via INTRAVENOUS

## 2013-08-23 MED ORDER — SODIUM CHLORIDE 0.9 % IR SOLN
Status: DC | PRN
  Administered 2013-08-23: 12:00:00

## 2013-08-23 MED ORDER — LEFLUNOMIDE 20 MG PO TABS
20.0000 mg | ORAL_TABLET | Freq: Every day | ORAL | Status: DC
  Filled 2013-08-23 (×2): qty 1

## 2013-08-23 MED ORDER — FENTANYL CITRATE 0.05 MG/ML IJ SOLN
25.0000 ug | INTRAMUSCULAR | Status: DC | PRN
  Administered 2013-08-23 (×4): 25 ug via INTRAVENOUS

## 2013-08-23 SURGICAL SUPPLY — 54 items
ADH SKN CLS APL DERMABOND .7 (GAUZE/BANDAGES/DRESSINGS) ×1
BANDAGE ELASTIC 4 VELCRO ST LF (GAUZE/BANDAGES/DRESSINGS) IMPLANT
CANISTER SUCTION 2500CC (MISCELLANEOUS) ×3 IMPLANT
CANNULA VESSEL 3MM 2 BLNT TIP (CANNULA) ×2 IMPLANT
CATH EMB 4FR 40CM (CATHETERS) ×2 IMPLANT
CLIP TI MEDIUM 24 (CLIP) ×3 IMPLANT
CLIP TI WIDE RED SMALL 24 (CLIP) ×3 IMPLANT
COVER SURGICAL LIGHT HANDLE (MISCELLANEOUS) ×3 IMPLANT
DERMABOND ADVANCED (GAUZE/BANDAGES/DRESSINGS) ×2
DERMABOND ADVANCED .7 DNX12 (GAUZE/BANDAGES/DRESSINGS) IMPLANT
DRAIN CHANNEL 15F RND FF W/TCR (WOUND CARE) IMPLANT
DRAPE WARM FLUID 44X44 (DRAPE) ×3 IMPLANT
DRSG COVADERM 4X8 (GAUZE/BANDAGES/DRESSINGS) IMPLANT
ELECT REM PT RETURN 9FT ADLT (ELECTROSURGICAL) ×3
ELECTRODE REM PT RTRN 9FT ADLT (ELECTROSURGICAL) ×1 IMPLANT
EVACUATOR SILICONE 100CC (DRAIN) IMPLANT
GLOVE BIO SURGEON STRL SZ7.5 (GLOVE) ×3 IMPLANT
GLOVE BIOGEL PI IND STRL 6.5 (GLOVE) IMPLANT
GLOVE BIOGEL PI IND STRL 7.0 (GLOVE) IMPLANT
GLOVE BIOGEL PI IND STRL 7.5 (GLOVE) IMPLANT
GLOVE BIOGEL PI IND STRL 8 (GLOVE) ×1 IMPLANT
GLOVE BIOGEL PI INDICATOR 6.5 (GLOVE) ×4
GLOVE BIOGEL PI INDICATOR 7.0 (GLOVE) ×2
GLOVE BIOGEL PI INDICATOR 7.5 (GLOVE) ×2
GLOVE BIOGEL PI INDICATOR 8 (GLOVE) ×2
GLOVE ECLIPSE 6.5 STRL STRAW (GLOVE) ×4 IMPLANT
GLOVE SS BIOGEL STRL SZ 7 (GLOVE) IMPLANT
GLOVE SUPERSENSE BIOGEL SZ 7 (GLOVE) ×2
GOWN STRL REUS W/ TWL LRG LVL3 (GOWN DISPOSABLE) ×3 IMPLANT
GOWN STRL REUS W/ TWL XL LVL3 (GOWN DISPOSABLE) IMPLANT
GOWN STRL REUS W/TWL LRG LVL3 (GOWN DISPOSABLE) ×12
GOWN STRL REUS W/TWL XL LVL3 (GOWN DISPOSABLE) ×3
KIT BASIN OR (CUSTOM PROCEDURE TRAY) ×3 IMPLANT
KIT ROOM TURNOVER OR (KITS) ×3 IMPLANT
NS IRRIG 1000ML POUR BTL (IV SOLUTION) ×6 IMPLANT
PACK PERIPHERAL VASCULAR (CUSTOM PROCEDURE TRAY) ×3 IMPLANT
PAD ARMBOARD 7.5X6 YLW CONV (MISCELLANEOUS) ×6 IMPLANT
SPONGE SURGIFOAM ABS GEL 100 (HEMOSTASIS) IMPLANT
STAPLER VISISTAT 35W (STAPLE) IMPLANT
STOPCOCK 4 WAY LG BORE MALE ST (IV SETS) ×2 IMPLANT
SUT PROLENE 5 0 C 1 24 (SUTURE) ×1 IMPLANT
SUT PROLENE 6 0 BV (SUTURE) ×5 IMPLANT
SUT SILK 3 0 (SUTURE) ×3
SUT SILK 3-0 18XBRD TIE 12 (SUTURE) IMPLANT
SUT VIC AB 2-0 CTB1 (SUTURE) ×3 IMPLANT
SUT VIC AB 3-0 SH 27 (SUTURE) ×3
SUT VIC AB 3-0 SH 27X BRD (SUTURE) ×1 IMPLANT
SUT VICRYL 4-0 PS2 18IN ABS (SUTURE) ×3 IMPLANT
SYR 3ML LL SCALE MARK (SYRINGE) ×2 IMPLANT
TOWEL OR 17X24 6PK STRL BLUE (TOWEL DISPOSABLE) ×6 IMPLANT
TOWEL OR 17X26 10 PK STRL BLUE (TOWEL DISPOSABLE) ×6 IMPLANT
TRAY FOLEY CATH 16FRSI W/METER (SET/KITS/TRAYS/PACK) ×3 IMPLANT
UNDERPAD 30X30 INCONTINENT (UNDERPADS AND DIAPERS) ×3 IMPLANT
WATER STERILE IRR 1000ML POUR (IV SOLUTION) ×3 IMPLANT

## 2013-08-23 NOTE — Anesthesia Preprocedure Evaluation (Addendum)
Anesthesia Evaluation  Patient identified by MRN, date of birth, ID band Patient awake    Reviewed: Allergy & Precautions, H&P , NPO status , Patient's Chart, lab work & pertinent test results  Airway Mallampati: II TM Distance: >3 FB Neck ROM: Full    Dental  (+) Edentulous Upper, Edentulous Lower, Dental Advisory Given   Pulmonary shortness of breath, former smoker,  Lung ca + rhonchi         Cardiovascular hypertension, Pt. on medications + CAD, + Past MI, + Peripheral Vascular Disease and +CHF Rhythm:Regular Rate:Normal     Neuro/Psych TIA   GI/Hepatic   Endo/Other    Renal/GU Renal InsufficiencyRenal diseaseCa prostate     Musculoskeletal  (+) Arthritis -, Rheumatoid disorders,    Abdominal   Peds  Hematology  (+) anemia ,   Anesthesia Other Findings   Reproductive/Obstetrics                          Anesthesia Physical Anesthesia Plan  ASA: IV  Anesthesia Plan: General   Post-op Pain Management:    Induction: Intravenous  Airway Management Planned: Oral ETT  Additional Equipment:   Intra-op Plan:   Post-operative Plan: Extubation in OR  Informed Consent: I have reviewed the patients History and Physical, chart, labs and discussed the procedure including the risks, benefits and alternatives for the proposed anesthesia with the patient or authorized representative who has indicated his/her understanding and acceptance.   Dental advisory given  Plan Discussed with: CRNA and Surgeon  Anesthesia Plan Comments:        Anesthesia Quick Evaluation

## 2013-08-23 NOTE — Anesthesia Procedure Notes (Signed)
Procedure Name: Intubation Date/Time: 08/23/2013 12:07 PM Performed by: Storm Frisk E Pre-anesthesia Checklist: Patient identified, Timeout performed, Emergency Drugs available, Suction available and Patient being monitored Patient Re-evaluated:Patient Re-evaluated prior to inductionOxygen Delivery Method: Circle system utilized Preoxygenation: Pre-oxygenation with 100% oxygen Intubation Type: IV induction and Cricoid Pressure applied Ventilation: Mask ventilation without difficulty Laryngoscope Size: Mac and 3 Grade View: Grade II Tube type: Oral Tube size: 8.0 mm Number of attempts: 1 Airway Equipment and Method: Stylet Placement Confirmation: ETT inserted through vocal cords under direct vision,  breath sounds checked- equal and bilateral and positive ETCO2 Secured at: 23 cm Tube secured with: Tape Dental Injury: Teeth and Oropharynx as per pre-operative assessment

## 2013-08-23 NOTE — H&P (View-Only) (Signed)
[facility] [place]  Patient name: [name] MRN: [id number] DOB: 06-03-1932 Sex: male  REASON FOR ADMISSION: Nonhealing wound of left great toe with peripheral vascular disease  HPI: [name] is a 80 y.o. male who I been following with the wound on his left great toe. He underwent an arteriogram which shows a 70% stenosis the distal left common femoral artery. The superficial femoral artery, popliteal, and peroneal arteries are patent. The anterior tibial and posterior tibial arteries are occluded. The only option that might improve blood flow distally would be endarterectomy of the left common femoral artery.  He presents for left common femoral artery endarterectomy and vein patch angioplasty. He has been evaluated by cardiology. It is felt that he does not require further cardiac workup. He is an acceptable risk for his upcoming surgery. He has atrial fibrillation. He will need a Lovenox bridge as per cardiology.   Past Medical History  Diagnosis Date  . CARCINOMA, LUNG, SQUAMOUS CELL 08/21/2008  . HYPERLIPIDEMIA 11/13/2006  . ANEMIA 09/07/2009  . MYOCARDIAL INFARCTION, HX OF 11/13/2006  . CORONARY ARTERY DISEASE 11/13/2006  . Atrial fibrillation 11/13/2006  . CONGESTIVE HEART FAILURE 02/01/2007  . CAROTID ARTERY STENOSIS, WITHOUT INFARCTION 09/07/2008  . KIDNEY DISEASE, CHRONIC, STAGE II 10/13/2007  . Rheumatoid arthritis(714.0) 11/13/2006  . OSTEOPENIA 11/13/2006  . Cancer of prostate 10/24/2009  . TRANSIENT ISCHEMIC ATTACK, HX OF 11/13/2006  . COLONIC POLYPS, HX OF 07/04/2003    6 mm adenoma  . Femoral artery occlusion     right  . PAD (peripheral artery disease)   . Stroke 2008   Family History  Problem Relation Age of Onset  . Tuberculosis Mother   . Cancer Sister     Breast    SOCIAL HISTORY: History  Substance Use Topics  . Smoking status: Former Smoker    Quit date: 06/09/2001  . Smokeless tobacco: Never Used  . Alcohol Use: No   No Known  Allergies Current Outpatient Prescriptions  Medication Sig Dispense Refill  . acetaminophen (TYLENOL) 325 MG tablet Take 650 mg by mouth every 6 (six) hours as needed. For pain or fever      . alendronate (FOSAMAX) 70 MG tablet Take 70 mg by mouth every Sunday. Take with a full glass of water on an empty stomach.      Marland Kitchen aspirin EC 81 MG tablet Take 81 mg by mouth daily.      . [facility] IRON 325 (65 FE) MG tablet TAKE 1 TABLET BY MOUTH DAILY.  90 tablet  3  . diltiazem (CARDIZEM CD) 120 MG 24 hr capsule TAKE ONE CAPSULE BY MOUTH EVERY DAY  90 capsule  3  . finasteride (PROSCAR) 5 MG tablet TAKE 1 TABLET BY MOUTH EVERY DAY  90 tablet  3  . fluticasone (FLONASE) 50 MCG/ACT nasal spray Place 2 sprays into the nose daily as needed. For allergies  16 g  5  . folic acid (FOLVITE) 237 MCG tablet Take 400 mcg by mouth daily.       . furosemide (LASIX) 20 MG tablet Take 1 tablet (20 mg total) by mouth daily.  30 tablet  3  . leflunomide (ARAVA) 20 MG tablet Take 20 mg by mouth daily.       Marland Kitchen lisinopril (PRINIVIL,ZESTRIL) 5 MG tablet Take 1 tablet (5 mg total) by mouth daily.  30 tablet  11  . metoprolol succinate (TOPROL-XL) 100 MG 24 hr tablet Take 100 mg by  mouth daily. Take with or immediately following a meal.      . predniSONE (DELTASONE) 5 MG tablet Take 5 mg by mouth daily.       . simvastatin (ZOCOR) 40 MG tablet Take 40 mg by mouth daily.      Marland Kitchen warfarin (COUMADIN) 5 MG tablet TAKE 1 TABLET BY MOUTH AS DIRECTED BY COUMADIN CLINIC  30 tablet  3   No current facility-administered medications for this visit.   REVIEW OF SYSTEMS: Valu.Nieves ] denotes positive finding; [  ] denotes negative finding  CARDIOVASCULAR:  [ ]  chest pain   [ ]  chest pressure   [ ]  palpitations   [ ]  orthopnea   [ ]  dyspnea on exertion   [ ]  claudication   [ ]  rest pain   [ ]  DVT   [ ]  phlebitis PULMONARY:   [ ]  productive cough   [ ]  asthma   [ ]  wheezing NEUROLOGIC:   [ ]  weakness  [ ]  paresthesias  [ ]  aphasia  [ ]  amaurosis  [ ]   dizziness HEMATOLOGIC:   [ ]  bleeding problems   [ ]  clotting disorders MUSCULOSKELETAL:  Valu.Nieves ] joint pain   [ ]  joint swelling [ ]  leg swelling GASTROINTESTINAL: [ ]   blood in stool  [ ]   hematemesis GENITOURINARY:  [ ]   dysuria  [ ]   hematuria PSYCHIATRIC:  [ ]  history of major depression INTEGUMENTARY:  [ ]  rashes  [ ]  ulcers CONSTITUTIONAL:  [ ]  fever   [ ]  chills  PHYSICAL EXAM: Filed Vitals:   08/10/13 1420  BP: 127/65  Pulse: 49  Height: 5\' 9"  (1.753 m)  Weight: 147 lb 1.6 oz (66.724 kg)  SpO2: 97%   Body mass index is 21.71 kg/(m^2). GENERAL: The patient is a well-nourished male, in no acute distress. The vital signs are documented above. CARDIOVASCULAR: There is a regular rate and rhythm. He has a palpable right femoral pulse. Has a diminished left femoral pulse. PULMONARY: There is good air exchange bilaterally without wheezing or rales. ABDOMEN: Soft and non-tender with normal pitched bowel sounds.  MUSCULOSKELETAL: There are no major deformities or cyanosis. NEUROLOGIC: No focal weakness or paresthesias are detected. SKIN: there is a 5 mm wound over his left first metatarsal joint. PSYCHIATRIC: The patient has a normal affect.  DATA:  I have reviewed his arteriogram which shows a focal stenosis of the distal left common femoral artery.  MEDICAL ISSUES:  Atherosclerosis of native arteries of the extremities with ulceration(440.23) The only real option for revascularization is an endarterectomy of the left common femoral artery with vein patch angioplasty. Hopefully this will improve the circulation to the left foot. If the wound in the left foot does not heal I think he will require ray amputation of the left great toe. Currently he would prefer to wait on a dictation of the toe. I have explained that given that he has some exposed bone I think that ultimately it will come to this but given that there is no significant infection currently this is not unreasonable to wait.  He has been cleared from a cardiac standpoint and his surgery is scheduled for 08/23/2013. Discussed the procedure and potential complications in the office today and he is agreeable to proceed.   [place] [facility] [place] Beeper: [phone]

## 2013-08-23 NOTE — Interval H&P Note (Signed)
History and Physical Interval Note:  08/23/2013 11:35 AM  [name]  has presented today for surgery, with the diagnosis of PVD with ulcer  The various methods of treatment have been discussed with the patient and family. After consideration of risks, benefits and other options for treatment, the patient has consented to  Procedure(s): LEFT COMMON FEMORAL ARTERY ENDARTERECTOMY WITH VPA (Left) as a surgical intervention .  The patient's history has been reviewed, patient examined, no change in status, stable for surgery.  I have reviewed the patient's chart and labs.  Questions were answered to the patient's satisfaction.     DICKSON,[name] S

## 2013-08-23 NOTE — Op Note (Signed)
    NAME: [name]   MRN: [id number] DOB: [birth year]    DATE OF OPERATION: 08/23/2013  PREOP DIAGNOSIS: Peripheral vascular disease with ulceration of the left great toe  POSTOP DIAGNOSIS: same  PROCEDURE: Left common femoral artery endarterectomy with vein patch angioplasty  SURGEON: Judeth Cornfield. Scot Dock, MD, FACS  ASSIST: Gerri Lins PA  ANESTHESIA: Gen.   EBL: minimal  INDICATIONS: [name] is a 80 y.o. male resident nonhealing wound of his left great toe. He underwent an arteriogram which showed a patent superficial femoral artery, popliteal artery, and peroneal artery. There was a stenosis at the distal common femoral artery it was felt that dressing this was really the only option for revascularization.  FINDINGS: He has diffuse calcific disease.  TECHNIQUE: The patient was taken to the operative room and received a general anesthetic. A longitudinal incision was made in the left groin. Through this incision the common femoral, superficial femoral, and deep femoral arteries were controlled. All the vessels were markedly calcific. I had to dissect up well above the inguinal ligament in order to find a soft spot in the external iliac artery right clamp. Small branches were controlled with red vessel loops. Next the same incision the saphenous vein was dissected free proximally. Branches were divided between clips and 3-0 silk ties. The vein was ligated proximally and distally and the segment excised and opened longitudinally to be used as a vein patch. The patient was heparinized.  Next the external iliac artery was clamped proximally. The superficial femoral artery and deep femoral artery were controlled with vessel loops. A longitudinal arteriotomy was made in the common femoral artery. This was extended down onto the superficial femoral artery. An endarterectomy plane was established and is diffusely calcific plaque was endarterectomized. Proximally I was able to  retrieve a fairly nice endpoint. Distally the plaque extended deep into the deep femoral artery and this came out nicely also. Likewise in the superficial femoral artery I seen to have a reasonable in point after eversion endarterectomy. He was backbleeding from the deep femoral artery and superficial femoral artery. The artery was irrigated comes masses saline and all leaks debris removed. The vein patch was then sewn using continuous 6-0 Prolene suture. Prior to completing this anastomosis, the artery was backbled and flushed appropriately and the anastomosis completed. Flow was reestablished to the left leg and was of good peroneal signal with the Doppler at the completion. The heparin was partially reversed with protamine.  The wounds closed the deep layer of 2-0 Vicryl. The subcutaneous layer was closed with 3-0 Vicryl. The skin was closed with 4-0 subcuticular stitch. Dermabond was applied. The patient tolerated the procedure well and transferred to the recovery room in stable condition. All needle and sponge counts were correct.  Deitra Mayo, MD, FACS [facility]  DATE OF DICTATION:   08/23/2013

## 2013-08-23 NOTE — Preoperative (Signed)
Beta Blockers   Reason not to administer Beta Blockers:Not Applicable 

## 2013-08-23 NOTE — Anesthesia Postprocedure Evaluation (Signed)
Anesthesia Post Note  Patient: [name]  Procedure(s) Performed: Procedure(s) (LRB): LEFT COMMON FEMORAL ARTERY ENDARTERECTOMY  (Left) PATCH ANGIOPLASTY USING LEFT LEG SAPHENOUS VEIN (Left)  Anesthesia type: general  Patient location: PACU  Post pain: Pain level controlled  Post assessment: Patient's Cardiovascular Status Stable  Last Vitals:  Filed Vitals:   08/23/13 1506  BP: 123/59  Pulse: 62  Temp:   Resp: 23    Post vital signs: Reviewed and stable  Level of consciousness: sedated  Complications: No apparent anesthesia complications

## 2013-08-23 NOTE — Transfer of Care (Signed)
Immediate Anesthesia Transfer of Care Note  Patient: [name]  Procedure(s) Performed: Procedure(s): LEFT COMMON FEMORAL ARTERY ENDARTERECTOMY  (Left) PATCH ANGIOPLASTY USING LEFT LEG SAPHENOUS VEIN (Left)  Patient Location: PACU  Anesthesia Type:General  Level of Consciousness: awake, oriented and patient cooperative  Airway & Oxygen Therapy: Patient Spontanous Breathing and Patient connected to nasal cannula oxygen  Post-op Assessment: Report given to PACU RN and Post -op Vital signs reviewed and stable  Post vital signs: Reviewed  Complications: No apparent anesthesia complications

## 2013-08-24 ENCOUNTER — Telehealth: Payer: Self-pay | Admitting: Vascular Surgery

## 2013-08-24 ENCOUNTER — Other Ambulatory Visit: Payer: Self-pay | Admitting: *Deleted

## 2013-08-24 DIAGNOSIS — Z48812 Encounter for surgical aftercare following surgery on the circulatory system: Secondary | ICD-10-CM

## 2013-08-24 DIAGNOSIS — I739 Peripheral vascular disease, unspecified: Secondary | ICD-10-CM

## 2013-08-24 LAB — BASIC METABOLIC PANEL
BUN: 34 mg/dL — AB (ref 6–23)
CO2: 21 mEq/L (ref 19–32)
CREATININE: 1.46 mg/dL — AB (ref 0.50–1.35)
Calcium: 8.6 mg/dL (ref 8.4–10.5)
Chloride: 97 mEq/L (ref 96–112)
GFR calc non Af Amer: 44 mL/min — ABNORMAL LOW (ref >90)
GFR, EST AFRICAN AMERICAN: 51 mL/min — AB (ref >90)
Glucose, Bld: 194 mg/dL — ABNORMAL HIGH (ref 70–99)
POTASSIUM: 4.4 meq/L (ref 3.7–5.3)
Sodium: 136 mEq/L — ABNORMAL LOW (ref 137–147)

## 2013-08-24 LAB — CBC
HEMATOCRIT: 28.8 % — AB (ref 39.0–52.0)
Hemoglobin: 9.4 g/dL — ABNORMAL LOW (ref 13.0–17.0)
MCH: 27.8 pg (ref 26.0–34.0)
MCHC: 32.6 g/dL (ref 30.0–36.0)
MCV: 85.2 fL (ref 78.0–100.0)
PLATELETS: 410 10*3/uL — AB (ref 150–400)
RBC: 3.38 MIL/uL — AB (ref 4.22–5.81)
RDW: 16.1 % — AB (ref 11.5–15.5)
WBC: 7.5 10*3/uL (ref 4.0–10.5)

## 2013-08-24 LAB — PROTIME-INR
INR: 1.55 — AB (ref 0.00–1.49)
PROTHROMBIN TIME: 18.2 s — AB (ref 11.6–15.2)

## 2013-08-24 MED ORDER — OXYCODONE-ACETAMINOPHEN 5-325 MG PO TABS: 1.0000 | ORAL_TABLET | ORAL | Status: DC | PRN

## 2013-08-24 NOTE — Telephone Encounter (Addendum)
Message copied by Gena Fray on Wed Aug 24, 2013  3:13 PM ------      Message from: Peter Minium K      Created: Wed Aug 24, 2013  7:57 AM      Regarding: schedule                   ----- Message -----         From: Ulyses Amor, PA-C         Sent: 08/24/2013   7:49 AM           To: [facility] Charge Pool            F/U with Dr. Scot Dock in 4 weeks s/p femoral endarterectomy Needs ABI       ------  08/24/13: spoke with pt to schedule, dpm

## 2013-08-24 NOTE — Discharge Summary (Signed)
Agree with plans for discharge.  Deitra Mayo, MD, [place] [phone] 08/24/2013

## 2013-08-24 NOTE — Progress Notes (Signed)
Discharge education and medication reviewed with patient, pt stated understanding and stated he had no questions, Pain medicine prescription given to pt and education concerning medication was provided, IV and tele removed Rickard Rhymes, RN

## 2013-08-24 NOTE — Evaluation (Signed)
Occupational Therapy Evaluation Patient Details Name: [name] MRN: [id number] DOB: Nov 15, 1931 Today's Date: 08/24/2013 Time: 0370-4888 OT Time Calculation (min): 25 min  OT Assessment / Plan / Recommendation History of present illness Admitted with PVD,Left common femoral artery endarterectomy with vein patch angioplasty   Clinical Impression   Pt is able to perform self care and ADL transfers at a supervision to modified independent level.  He will have assist of his family at home.  Pt's HR noted to rise to the 140s during ambulation.  Instructed pt to use shower seat and in proper footwear given his sore toe.  No further OT needs.    OT Assessment  Patient does not need any further OT services    Follow Up Recommendations  No OT follow up;Supervision - Intermittent    Barriers to Discharge      Equipment Recommendations  None recommended by OT    Recommendations for Other Services    Frequency       Precautions / Restrictions     Pertinent Vitals/Pain HR in 140s with activity, 02 sats in mid 90s on RA, some soreness of L great toe (chronic)    ADL  Eating/Feeding: Independent Where Assessed - Eating/Feeding: Chair Grooming: Wash/dry hands;Independent Where Assessed - Grooming: Unsupported standing Upper Body Bathing: Modified independent Where Assessed - Upper Body Bathing: Unsupported sitting Lower Body Bathing: Modified independent Where Assessed - Lower Body Bathing: Unsupported sitting;Supported sit to stand Upper Body Dressing: Independent Where Assessed - Upper Body Dressing: Unsupported sitting Lower Body Dressing: Modified independent Where Assessed - Lower Body Dressing: Unsupported sitting;Supported sit to stand Toilet Transfer: Modified independent Toilet Transfer Method: Sit to Loss adjuster, chartered: Comfort height toilet Toileting - Clothing Manipulation and Hygiene: Independent Where Assessed - Camera operator Manipulation and  Hygiene: Standing Transfers/Ambulation Related to ADLs: supervision (slower pace, some use of IV pole) due to sore toe ADL Comments: educated in safe footwear    OT Diagnosis:    OT Problem List:   OT Treatment Interventions:     OT Goals(Current goals can be found in the care plan section) Acute Rehab OT Goals Patient Stated Goal: home with family  Visit Information  Last OT Received On: 08/24/13 Assistance Needed: +1 History of Present Illness: Admitted with PVD,Left common femoral artery endarterectomy with vein patch angioplasty       Prior Functioning     Home Living Family/patient expects to be discharged to:: Private residence Living Arrangements: Spouse/significant other;Children Available Help at Discharge: Family;Available 24 hours/day Type of Home: House Home Access: Stairs to enter [employer] of Steps: 3 Entrance Stairs-Rails: Right;Left;Can reach both Home Layout: Multi-level;Bed/bath upstairs Alternate Level Stairs-Number of Steps: 4 Alternate Level Stairs-Rails: Left Home Equipment: Clinical cytogeneticist - 2 wheels Prior Function Level of Independence: Independent Comments: drive Communication Communication: No difficulties Dominant Hand: Right         Vision/Perception Vision - History Baseline Vision: Wears glasses only for reading   Cognition  Cognition Arousal/Alertness: Awake/alert Behavior During Therapy: WFL for tasks assessed/performed Overall Cognitive Status: Within Functional Limits for tasks assessed    Extremity/Trunk Assessment Upper Extremity Assessment Upper Extremity Assessment: Overall WFL for tasks assessed Lower Extremity Assessment Lower Extremity Assessment: Defer to PT evaluation Cervical / Trunk Assessment Cervical / Trunk Assessment: Normal     Mobility Bed Mobility Overal bed mobility: Modified Independent Transfers Overall transfer level: Needs assistance Transfers: Sit to/from Stand Sit to Stand:  Supervision General transfer comment: Will quickly reach independent  level if not already     Exercise     Balance Balance Overall balance assessment: No apparent balance deficits (not formally assessed)   End of Session OT - End of Session Activity Tolerance: Patient tolerated treatment well (Pt with HR in 140s with ambulation) Patient left: in chair;with call bell/phone within reach Nurse Communication: Mobility status  GO     Malka So 08/24/2013, 10:56 AM [phone]

## 2013-08-24 NOTE — Discharge Summary (Signed)
[facility] Discharge Summary   Patient ID:  [name] MRN: [id number] DOB/AGE: August 26, 1931 80 y.o.  Admit date: 08/23/2013 Discharge date: 08/24/2013 Date of Surgery: 08/23/2013 Surgeon: Surgeon(s): Angelia Mould, MD  Admission Diagnosis: PVD with ulcer  Discharge Diagnoses:  PVD with ulcer  Secondary Diagnoses: Past Medical History  Diagnosis Date  . CARCINOMA, LUNG, SQUAMOUS CELL 08/21/2008  . HYPERLIPIDEMIA 11/13/2006  . ANEMIA 09/07/2009  . MYOCARDIAL INFARCTION, HX OF 11/13/2006  . CORONARY ARTERY DISEASE 11/13/2006  . Atrial fibrillation 11/13/2006  . CONGESTIVE HEART FAILURE 02/01/2007  . CAROTID ARTERY STENOSIS, WITHOUT INFARCTION 09/07/2008  . KIDNEY DISEASE, CHRONIC, STAGE II 10/13/2007  . Rheumatoid arthritis(714.0) 11/13/2006  . OSTEOPENIA 11/13/2006  . Cancer of prostate 10/24/2009  . TRANSIENT ISCHEMIC ATTACK, HX OF 11/13/2006  . COLONIC POLYPS, HX OF 07/04/2003    6 mm adenoma  . Femoral artery occlusion     right  . PAD (peripheral artery disease)   . Stroke 2008  . Shortness of breath     with exertion    Procedure(s): LEFT COMMON FEMORAL ARTERY ENDARTERECTOMY  PATCH ANGIOPLASTY USING LEFT LEG SAPHENOUS VEIN  Discharged Condition: good  HPI: 80 y/o male with nonhealing wound of left great toe with peripheral vascular disease.  He was taken to the OR by Dr. Scot Dock 08/23/2013 and underwent Left common femoral artery endarterectomy with vein patch angioplasty.  Post-op day 1 Incision is clean and dry without hematoma.  He is voiding, taking PO's well and ambulating.  He will be discharged home today and will f/u with Dr. Scot Dock in 4 weeks.    Hospital Course:  [name] is a 80 y.o. male is S/P Left Procedure(s): LEFT COMMON FEMORAL ARTERY ENDARTERECTOMY  PATCH ANGIOPLASTY USING LEFT LEG SAPHENOUS VEIN Extubated: POD # 0 Physical exam: Left groin incision looks good  Left foot warm  Wound unchanged  Post-op wounds healing  well Pt. Ambulating, voiding and taking PO diet without difficulty. Pt pain controlled with PO pain meds. Labs as below Complications:none  Consults:     Significant Diagnostic Studies: CBC Lab Results  Component Value Date   WBC 7.5 08/23/2013   HGB 9.4* 08/23/2013   HCT 28.8* 08/23/2013   MCV 85.2 08/23/2013   PLT 410* 08/23/2013    BMET    Component Value Date/Time   NA 136* 08/23/2013 2350   NA 140 08/12/2013 1416   NA 141 10/22/2011 0944   K 4.4 08/23/2013 2350   K 4.6 08/12/2013 1416   K 4.8* 10/22/2011 0944   CL 97 08/23/2013 2350   CL 111* 06/11/2012 1102   CL 98 10/22/2011 0944   CO2 21 08/23/2013 2350   CO2 26 08/12/2013 1416   CO2 26 10/22/2011 0944   GLUCOSE 194* 08/23/2013 2350   GLUCOSE 103 08/12/2013 1416   GLUCOSE 94 06/11/2012 1102   GLUCOSE 91 10/22/2011 0944   BUN 34* 08/23/2013 2350   BUN 29.2* 08/12/2013 1416   BUN 29* 10/22/2011 0944   CREATININE 1.46* 08/23/2013 2350   CREATININE 1.5* 08/12/2013 1416   CREATININE 1.5* 10/22/2011 0944   CALCIUM 8.6 08/23/2013 2350   CALCIUM 9.8 08/12/2013 1416   CALCIUM 8.8 10/22/2011 0944   CALCIUM 9.4 10/14/2007 0110   GFRNONAA 44* 08/23/2013 2350   GFRAA 51* 08/23/2013 2350   COAG Lab Results  Component Value Date   INR 1.55* 08/23/2013   INR 1.55* 08/23/2013   INR 2.59* 08/18/2013   PROTIME 20.7 11/22/2008  Disposition:  Discharge to :Home Discharge Orders   Future Appointments Provider Department Dept Phone   08/29/2013 10:35 AM [facility] [place] [facility] [phone]   09/16/2013 8:00 AM Lisabeth Pick, MD [facility] [place]   09/28/2013 9:00 AM [facility] [place] [phone]   09/28/2013 9:45 AM Angelia Mould, MD [facility] [phone]   10/12/2013 1:15 PM [facility] [phone]   10/12/2013 1:45 PM Wyatt Portela, MD [facility] [phone]   Future Orders Complete By Expires   Call MD for:  redness, tenderness, or signs of infection (pain, swelling, bleeding, redness, odor or green/yellow discharge around incision site)  As directed    Call MD for:  severe or increased pain, loss or decreased feeling  in affected limb(s)  As directed    Call MD for:  temperature >100.5  As directed    Discharge instructions  As directed    Comments:     Keep groin incision clean and dry. You may shower in 24 hours.   Driving Restrictions  As directed    Comments:     No driving for 2 weeks   Increase activity slowly  As directed    Comments:     Walk with assistance use walker or cane as needed   Lifting restrictions  As directed    Comments:     No lifting for 6 weeks   May shower   As directed    Resume previous diet  As directed        Medication List         acetaminophen 325 MG tablet  Commonly known as:  TYLENOL  Take 650 mg by mouth every 6 (six) hours as needed. For pain or fever     alendronate 70 MG tablet  Commonly known as:  FOSAMAX  Take 70 mg by mouth every Sunday. Take with a full glass of water on an empty stomach.     aspirin EC 81 MG tablet  Take 81 mg by mouth daily.     [employer] IRON 325 (65 FE) MG tablet  Generic drug:  ferrous sulfate  TAKE 1 TABLET BY MOUTH DAILY.     diltiazem 120 MG 24 hr capsule  Commonly known as:  CARDIZEM CD  Take 120 mg by mouth daily.     enoxaparin 80 MG/0.8ML injection  Commonly known as:  LOVENOX  Inject 0.8 mLs (80 mg total) into the skin daily.     finasteride 5 MG tablet  Commonly known as:  PROSCAR  Take 5 mg by mouth daily.     fluticasone 50 MCG/ACT nasal spray  Commonly known as:  FLONASE  Place 2 sprays into the nose daily as needed. For allergies     folic acid 540 MCG tablet  Commonly known as:  FOLVITE  Take 400 mcg by mouth daily.     furosemide 20 MG tablet  Commonly known as:  LASIX  Take 1 tablet (20 mg total) by mouth daily.      leflunomide 20 MG tablet  Commonly known as:  ARAVA  Take 20 mg by mouth daily.     lisinopril 5 MG tablet  Commonly known as:  PRINIVIL,ZESTRIL  Take 1 tablet (5 mg total) by mouth daily.     metoprolol succinate 100 MG 24 hr tablet  Commonly known  as:  TOPROL-XL  Take 100 mg by mouth daily. Take with or immediately following a meal.     oxyCODONE-acetaminophen 5-325 MG per tablet  Commonly known as:  PERCOCET/ROXICET  Take 1 tablet by mouth every 4 (four) hours as needed for moderate pain.     predniSONE 5 MG tablet  Commonly known as:  DELTASONE  Take 5 mg by mouth daily.     simvastatin 40 MG tablet  Commonly known as:  ZOCOR  Take 40 mg by mouth daily.     warfarin 5 MG tablet  Commonly known as:  COUMADIN  Take 5 mg by mouth daily. 5mg  on Monday, Wednesday and Friday.  All other days take 2.5mg        Verbal and written Discharge instructions given to the patient. Wound care per Discharge AVS     Follow-up Information   Follow up with DICKSON,[name] S, MD In 4 weeks. (sent to office)    Specialty:  Vascular Surgery   Contact information:   [address] [place] [address] [phone]       Signed: Laurence Slate [facility] 08/24/2013, 3:41 PM

## 2013-08-24 NOTE — Progress Notes (Signed)
   VASCULAR PROGRESS NOTE  SUBJECTIVE: No complaints  PHYSICAL EXAM: Filed Vitals:   08/23/13 1606 08/23/13 2016 08/24/13 0010 08/24/13 0411  BP: 129/58 106/50 96/57 109/53  Pulse: 71 76 77 96  Temp: 97.8 F (36.6 C) 97.9 F (36.6 C) 98 F (36.7 C) 98.2 F (36.8 C)  TempSrc: Oral Oral Oral Oral  Resp: 18 18 17 16   Height: 5' 8.9" (1.75 m)     Weight: 145 lb 1 oz (65.8 kg)     SpO2: 100% 100% 100% 100%   Left groin incision looks good Left foot warm Wound unchanged  LABS: Lab Results  Component Value Date   WBC 7.5 08/23/2013   HGB 9.4* 08/23/2013   HCT 28.8* 08/23/2013   MCV 85.2 08/23/2013   PLT 410* 08/23/2013   Lab Results  Component Value Date   CREATININE 1.46* 08/23/2013   Lab Results  Component Value Date   INR 1.55* 08/23/2013   PROTIME 20.7 11/22/2008   CBG (last 3)  No results found for this basename: GLUCAP,  in the last 72 hours  Active Problems:   Atherosclerotic PVD with ulceration   PAD (peripheral artery disease)   ASSESSMENT AND PLAN:  * 1 Day Post-Op s/p: left fem endarterectomy and VPA  *  Doing well. Home today.   * cont dressing change to toe.   Gae Gallop Beeper: [phone] 08/24/2013

## 2013-08-24 NOTE — Evaluation (Signed)
Physical Therapy Evaluation Patient Details Name: [name] MRN: [id number] DOB: [birth year] Today's Date: 08/24/2013 Time: 2841-3244 PT Time Calculation (min): 20 min  PT Assessment / Plan / Recommendation History of Present Illness  Admitted with PVD,Left common femoral artery endarterectomy with vein patch angioplasty  Clinical Impression   Patient evaluated by Physical Therapy with no further acute PT needs identified. All education has been completed and the patient has no further questions.  See below for any follow-up Physial Therapy or equipment needs. PT is signing off. Thank you for this referral.      PT Assessment  Patent does not need any further PT services    Follow Up Recommendations  No PT follow up    Does the patient have the potential to tolerate intense rehabilitation      Barriers to Discharge        Equipment Recommendations  None recommended by PT    Recommendations for Other Services     Frequency      Precautions / Restrictions     Pertinent Vitals/Pain VSS.  Pt is in minimal pain.      Mobility  Bed Mobility Overal bed mobility: Modified Independent Transfers Overall transfer level: Needs assistance Transfers: Sit to/from Stand Sit to Stand: Supervision General transfer comment: Will quickly reach independent level if not already Ambulation/Gait Ambulation/Gait assistance: Supervision Ambulation Distance (Feet): 350 Feet Assistive device: None (versus iv pole) Gait Pattern/deviations: Step-through pattern (mildly antalgic) Gait velocity: slow due to mild pain from toe ulceration Gait velocity interpretation: at or above normal speed for age/gender General Gait Details: Generally wfl, but mildly antalgic. Stairs: Yes Stairs assistance: Modified independent (Device/Increase time) Stair Management: One rail Left;Step to pattern;Alternating pattern;Forwards Number of Stairs: 5    Exercises     PT Diagnosis:    PT Problem List:    PT Treatment Interventions:       PT Goals(Current goals can be found in the care plan section) Acute Rehab PT Goals PT Goal Formulation: No goals set, d/c therapy Potential to Achieve Goals: Good  Visit Information  Last PT Received On: 08/24/13 Assistance Needed: +1 History of Present Illness: Admitted with PVD,Left common femoral artery endarterectomy with vein patch angioplasty       Prior Functioning  Home Living Family/patient expects to be discharged to:: Private residence Living Arrangements: Spouse/significant other;Children Available Help at Discharge: Family;Available 24 hours/day Type of Home: House Home Access: Stairs to enter [employer] of Steps: 3 Entrance Stairs-Rails: Right;Left;Can reach both Home Layout: Multi-level;Bed/bath upstairs Alternate Level Stairs-Number of Steps: 4 Alternate Level Stairs-Rails: Left Home Equipment: Clinical cytogeneticist - 2 wheels Prior Function Level of Independence: Independent Comments: drive Communication Communication: No difficulties Dominant Hand: Right    Cognition  Cognition Arousal/Alertness: Awake/alert Behavior During Therapy: WFL for tasks assessed/performed Overall Cognitive Status: Within Functional Limits for tasks assessed    Extremity/Trunk Assessment Upper Extremity Assessment Upper Extremity Assessment: Overall WFL for tasks assessed Lower Extremity Assessment Lower Extremity Assessment: Overall WFL for tasks assessed   Balance Balance Overall balance assessment: No apparent balance deficits (not formally assessed)  End of Session PT - End of Session Activity Tolerance: Patient tolerated treatment well Patient left: in chair;with call bell/phone within reach Nurse Communication: Mobility status  GP     Brennen Gardiner, Tessie Fass 08/24/2013, 10:53 AM 08/24/2013  Donnella Sham, PT [phone]  (pager)

## 2013-08-26 ENCOUNTER — Other Ambulatory Visit: Payer: Self-pay | Admitting: Internal Medicine

## 2013-08-26 ENCOUNTER — Encounter (HOSPITAL_COMMUNITY): Payer: Self-pay | Admitting: Vascular Surgery

## 2013-08-29 ENCOUNTER — Ambulatory Visit (INDEPENDENT_AMBULATORY_CARE_PROVIDER_SITE_OTHER): Payer: Medicare Other | Admitting: Pharmacist

## 2013-08-29 DIAGNOSIS — Z8679 Personal history of other diseases of the circulatory system: Secondary | ICD-10-CM

## 2013-08-29 DIAGNOSIS — I4891 Unspecified atrial fibrillation: Secondary | ICD-10-CM

## 2013-08-29 DIAGNOSIS — Z5181 Encounter for therapeutic drug level monitoring: Secondary | ICD-10-CM

## 2013-08-29 LAB — POCT INR: INR: 2.7

## 2013-09-08 ENCOUNTER — Ambulatory Visit (INDEPENDENT_AMBULATORY_CARE_PROVIDER_SITE_OTHER): Payer: Medicare Other | Admitting: *Deleted

## 2013-09-08 DIAGNOSIS — Z8679 Personal history of other diseases of the circulatory system: Secondary | ICD-10-CM

## 2013-09-08 DIAGNOSIS — Z5181 Encounter for therapeutic drug level monitoring: Secondary | ICD-10-CM

## 2013-09-08 DIAGNOSIS — I4891 Unspecified atrial fibrillation: Secondary | ICD-10-CM

## 2013-09-08 LAB — POCT INR: INR: 4.3

## 2013-09-16 ENCOUNTER — Ambulatory Visit: Payer: Medicare Other | Admitting: Internal Medicine

## 2013-09-19 ENCOUNTER — Ambulatory Visit (INDEPENDENT_AMBULATORY_CARE_PROVIDER_SITE_OTHER): Payer: Medicare Other | Admitting: Pharmacist

## 2013-09-19 ENCOUNTER — Ambulatory Visit (INDEPENDENT_AMBULATORY_CARE_PROVIDER_SITE_OTHER): Payer: Medicare Other | Admitting: Internal Medicine

## 2013-09-19 ENCOUNTER — Encounter: Payer: Self-pay | Admitting: Internal Medicine

## 2013-09-19 VITALS — BP 102/68 | HR 72 | Temp 97.8°F | Ht 69.0 in | Wt 141.0 lb

## 2013-09-19 DIAGNOSIS — I739 Peripheral vascular disease, unspecified: Secondary | ICD-10-CM

## 2013-09-19 DIAGNOSIS — I2510 Atherosclerotic heart disease of native coronary artery without angina pectoris: Secondary | ICD-10-CM

## 2013-09-19 DIAGNOSIS — C3490 Malignant neoplasm of unspecified part of unspecified bronchus or lung: Secondary | ICD-10-CM

## 2013-09-19 DIAGNOSIS — E785 Hyperlipidemia, unspecified: Secondary | ICD-10-CM

## 2013-09-19 DIAGNOSIS — N182 Chronic kidney disease, stage 2 (mild): Secondary | ICD-10-CM

## 2013-09-19 DIAGNOSIS — Z8679 Personal history of other diseases of the circulatory system: Secondary | ICD-10-CM

## 2013-09-19 DIAGNOSIS — M069 Rheumatoid arthritis, unspecified: Secondary | ICD-10-CM

## 2013-09-19 DIAGNOSIS — I4891 Unspecified atrial fibrillation: Secondary | ICD-10-CM

## 2013-09-19 DIAGNOSIS — Z5181 Encounter for therapeutic drug level monitoring: Secondary | ICD-10-CM

## 2013-09-19 DIAGNOSIS — I509 Heart failure, unspecified: Secondary | ICD-10-CM

## 2013-09-19 LAB — CBC WITH DIFFERENTIAL/PLATELET
Basophils Absolute: 0.1 10*3/uL (ref 0.0–0.1)
Basophils Relative: 1.4 % (ref 0.0–3.0)
EOS PCT: 1.8 % (ref 0.0–5.0)
Eosinophils Absolute: 0.1 10*3/uL (ref 0.0–0.7)
HCT: 31.1 % — ABNORMAL LOW (ref 39.0–52.0)
Hemoglobin: 10 g/dL — ABNORMAL LOW (ref 13.0–17.0)
LYMPHS PCT: 17 % (ref 12.0–46.0)
Lymphs Abs: 1.2 10*3/uL (ref 0.7–4.0)
MCHC: 32.2 g/dL (ref 30.0–36.0)
MCV: 87.2 fl (ref 78.0–100.0)
Monocytes Absolute: 0.9 10*3/uL (ref 0.1–1.0)
Monocytes Relative: 12.8 % — ABNORMAL HIGH (ref 3.0–12.0)
NEUTROS PCT: 67 % (ref 43.0–77.0)
Neutro Abs: 4.5 10*3/uL (ref 1.4–7.7)
PLATELETS: 492 10*3/uL — AB (ref 150.0–400.0)
RBC: 3.57 Mil/uL — AB (ref 4.22–5.81)
RDW: 17.2 % — ABNORMAL HIGH (ref 11.5–14.6)
WBC: 6.8 10*3/uL (ref 4.5–10.5)

## 2013-09-19 LAB — LIPID PANEL
Cholesterol: 148 mg/dL (ref 0–200)
HDL: 39.2 mg/dL (ref >39.00)
LDL CALC: 88 mg/dL (ref 0–99)
TRIGLYCERIDES: 102 mg/dL (ref 0.0–149.0)
Total CHOL/HDL Ratio: 4
VLDL: 20.4 mg/dL (ref 0.0–40.0)

## 2013-09-19 LAB — HEPATIC FUNCTION PANEL
ALBUMIN: 3.7 g/dL (ref 3.5–5.2)
ALT: 14 U/L (ref 0–53)
AST: 15 U/L (ref 0–37)
Alkaline Phosphatase: 80 U/L (ref 39–117)
Bilirubin, Direct: 0.2 mg/dL (ref 0.0–0.3)
TOTAL PROTEIN: 7.5 g/dL (ref 6.0–8.3)
Total Bilirubin: 0.8 mg/dL (ref 0.3–1.2)

## 2013-09-19 LAB — BASIC METABOLIC PANEL
BUN: 33 mg/dL — AB (ref 6–23)
CALCIUM: 9.3 mg/dL (ref 8.4–10.5)
CO2: 25 mEq/L (ref 19–32)
CREATININE: 1.6 mg/dL — AB (ref 0.4–1.5)
Chloride: 101 mEq/L (ref 96–112)
GFR: 54.57 mL/min — AB (ref >60.00)
Glucose, Bld: 82 mg/dL (ref 70–99)
Potassium: 4.3 mEq/L (ref 3.5–5.1)
Sodium: 138 mEq/L (ref 135–145)

## 2013-09-19 LAB — POCT INR: INR: 3.8

## 2013-09-19 LAB — TSH: TSH: 1.29 u[IU]/mL (ref 0.35–5.50)

## 2013-09-19 NOTE — Progress Notes (Signed)
Pre visit review using our clinic review tool, if applicable. No additional management support is needed unless otherwise documented below in the visit note. 

## 2013-09-19 NOTE — Progress Notes (Signed)
Vascular disease- has f/u with dr. Scot Dock  RA- has f/u with rheumatology  Pt notes a new ";lump" on right foot   CAD- no sxs, needs f/u labs  Past Medical History  Diagnosis Date  . CARCINOMA, LUNG, SQUAMOUS CELL 08/21/2008  . HYPERLIPIDEMIA 11/13/2006  . ANEMIA 09/07/2009  . MYOCARDIAL INFARCTION, HX OF 11/13/2006  . CORONARY ARTERY DISEASE 11/13/2006  . Atrial fibrillation 11/13/2006  . CONGESTIVE HEART FAILURE 02/01/2007  . CAROTID ARTERY STENOSIS, WITHOUT INFARCTION 09/07/2008  . KIDNEY DISEASE, CHRONIC, STAGE II 10/13/2007  . Rheumatoid arthritis(714.0) 11/13/2006  . OSTEOPENIA 11/13/2006  . Cancer of prostate 10/24/2009  . TRANSIENT ISCHEMIC ATTACK, HX OF 11/13/2006  . COLONIC POLYPS, HX OF 07/04/2003    6 mm adenoma  . Femoral artery occlusion     right  . PAD (peripheral artery disease)   . Stroke 2008  . Shortness of breath     with exertion    History   Social History  . Marital Status: Married    Spouse Name: N/A    Number of Children: 4  . Years of Education: N/A   Occupational History  . former truck Geophysicist/field seismologist    Social History Main Topics  . Smoking status: Former Smoker -- 1.00 packs/day for 30 years    Quit date: 06/09/2001  . Smokeless tobacco: Never Used  . Alcohol Use: No  . Drug Use: No  . Sexual Activity: Not on file   Other Topics Concern  . Not on file   Social History Narrative  . No narrative on file    Past Surgical History  Procedure Laterality Date  . Angioplasty  1995  . Embolectomy    . Femoral artery - popliteal artery bypass graft  06/21/08    rt below-knee  . Coronary artery bypass graft  01/31/02  . Cardiac electrophysiology study and ablation      radiofrequency catheter of atrial flutter  . Lobectomy  2009    right lower  . Bilateral vats ablation  2009    right minithoracotomy  . Colonoscopy w/ biopsies and polypectomy  06/2003, 09/2009    tubular adenoma, diverticulosis,   . Esophagogastroduodenoscopy  09/2009    2 gastric antral  ulcers, antral gastritis  . Eye surgery Bilateral     cataracts  . Endarterectomy femoral Left 08/23/2013    Procedure: LEFT COMMON FEMORAL ARTERY ENDARTERECTOMY ;  Surgeon: Angelia Mould, MD;  Location: [facility];  Service: Vascular;  Laterality: Left;  . Patch angioplasty Left 08/23/2013    Procedure: PATCH ANGIOPLASTY USING LEFT LEG SAPHENOUS VEIN;  Surgeon: Angelia Mould, MD;  Location: [facility];  Service: Vascular;  Laterality: Left;    Family History  Problem Relation Age of Onset  . Tuberculosis Mother   . Cancer Sister     Breast     No Known Allergies  Current Outpatient Prescriptions on File Prior to Visit  Medication Sig Dispense Refill  . acetaminophen (TYLENOL) 325 MG tablet Take 650 mg by mouth every 6 (six) hours as needed. For pain or fever      . alendronate (FOSAMAX) 70 MG tablet Take 70 mg by mouth every Sunday. Take with a full glass of water on an empty stomach.      Marland Kitchen aspirin EC 81 MG tablet Take 81 mg by mouth daily.      . [employer] IRON 325 (65 FE) MG tablet TAKE 1 TABLET BY MOUTH DAILY.  90 tablet  3  .  diltiazem (CARDIZEM CD) 120 MG 24 hr capsule Take 120 mg by mouth daily.      Marland Kitchen enoxaparin (LOVENOX) 80 MG/0.8ML injection Inject 0.8 mLs (80 mg total) into the skin daily.  10 Syringe  0  . finasteride (PROSCAR) 5 MG tablet Take 5 mg by mouth daily.      . fluticasone (FLONASE) 50 MCG/ACT nasal spray Place 2 sprays into the nose daily as needed. For allergies  16 g  5  . folic acid (FOLVITE) 269 MCG tablet Take 400 mcg by mouth daily.       . furosemide (LASIX) 20 MG tablet TAKE 1 TABLET BY MOUTH EVERY DAY  30 tablet  3  . leflunomide (ARAVA) 20 MG tablet Take 20 mg by mouth daily.       Marland Kitchen lisinopril (PRINIVIL,ZESTRIL) 5 MG tablet Take 1 tablet (5 mg total) by mouth daily.  30 tablet  11  . metoprolol succinate (TOPROL-XL) 100 MG 24 hr tablet Take 100 mg by mouth daily. Take with or immediately following a meal.      . oxyCODONE-acetaminophen  (PERCOCET/ROXICET) 5-325 MG per tablet Take 1 tablet by mouth every 4 (four) hours as needed for moderate pain.  30 tablet  0  . predniSONE (DELTASONE) 5 MG tablet Take 5 mg by mouth daily.       . simvastatin (ZOCOR) 40 MG tablet Take 40 mg by mouth daily.      Marland Kitchen warfarin (COUMADIN) 5 MG tablet Take 5 mg by mouth daily. 5mg  on Monday, Wednesday and Friday.  All other days take 2.5mg        No current facility-administered medications on file prior to visit.     patient denies chest pain, shortness of breath, orthopnea. Denies lower extremity edema, abdominal pain, change in appetite, change in bowel movements. Patient denies rashes, musculoskeletal complaints. No other specific complaints in a complete review of systems.   BP 102/68  Pulse 72  Temp(Src) 97.8 F (36.6 C) (Oral)  Ht 5\' 9"  (1.753 m)  Wt 141 lb (63.957 kg)  BMI 20.81 kg/m2  well-developed well-nourished male in no acute distress. HEENT exam atraumatic, normocephalic, neck supple without jugular venous distention. Chest clear to auscultation cardiac exam S1-S2 are regular. Abdominal exam overweight with bowel sounds, soft and nontender. Extremities no edema. Neurologic exam is alert with a normal gait.

## 2013-09-21 NOTE — Assessment & Plan Note (Signed)
Has regular f/u with oncology

## 2013-09-21 NOTE — Assessment & Plan Note (Signed)
Clinically doing well.  No s/s of CHF Continue same meds Reviewed echo from 12/1/6

## 2013-09-21 NOTE — Assessment & Plan Note (Signed)
He has regular f/u with rheumatology. Continue leflunomide

## 2013-09-27 ENCOUNTER — Encounter: Payer: Self-pay | Admitting: Vascular Surgery

## 2013-09-28 ENCOUNTER — Ambulatory Visit (INDEPENDENT_AMBULATORY_CARE_PROVIDER_SITE_OTHER): Payer: Self-pay | Admitting: Vascular Surgery

## 2013-09-28 ENCOUNTER — Ambulatory Visit (INDEPENDENT_AMBULATORY_CARE_PROVIDER_SITE_OTHER)
Admission: RE | Admit: 2013-09-28 | Discharge: 2013-09-28 | Disposition: A | Discharge status: Home / Self Care | Payer: Medicare Other | Source: Ambulatory Visit | Attending: Vascular Surgery | Admitting: Vascular Surgery

## 2013-09-28 ENCOUNTER — Other Ambulatory Visit: Payer: Self-pay | Admitting: Vascular Surgery

## 2013-09-28 ENCOUNTER — Ambulatory Visit (HOSPITAL_COMMUNITY)
Admission: RE | Admit: 2013-09-28 | Discharge: 2013-09-28 | Disposition: A | Discharge status: Home / Self Care | Payer: Medicare Other | Source: Ambulatory Visit | Attending: Vascular Surgery | Admitting: Vascular Surgery

## 2013-09-28 ENCOUNTER — Encounter: Payer: Self-pay | Admitting: Vascular Surgery

## 2013-09-28 ENCOUNTER — Ambulatory Visit (INDEPENDENT_AMBULATORY_CARE_PROVIDER_SITE_OTHER): Payer: Medicare Other | Admitting: *Deleted

## 2013-09-28 VITALS — BP 113/53 | HR 103 | Ht 69.0 in | Wt 140.0 lb

## 2013-09-28 DIAGNOSIS — R1909 Other intra-abdominal and pelvic swelling, mass and lump: Secondary | ICD-10-CM

## 2013-09-28 DIAGNOSIS — Z8679 Personal history of other diseases of the circulatory system: Secondary | ICD-10-CM

## 2013-09-28 DIAGNOSIS — I739 Peripheral vascular disease, unspecified: Secondary | ICD-10-CM

## 2013-09-28 DIAGNOSIS — R1900 Intra-abdominal and pelvic swelling, mass and lump, unspecified site: Secondary | ICD-10-CM

## 2013-09-28 DIAGNOSIS — Z48812 Encounter for surgical aftercare following surgery on the circulatory system: Secondary | ICD-10-CM

## 2013-09-28 DIAGNOSIS — I4891 Unspecified atrial fibrillation: Secondary | ICD-10-CM

## 2013-09-28 DIAGNOSIS — Z5181 Encounter for therapeutic drug level monitoring: Secondary | ICD-10-CM

## 2013-09-28 LAB — POCT INR: INR: 2.4

## 2013-09-28 NOTE — Progress Notes (Signed)
   Patient name: [name] MRN: [id number] DOB: Oct 12, 1931 Sex: male  REASON FOR VISIT: Follow up after left femoral endarterectomy  HPI: [name] is a 80 y.o. male who presented with a nonhealing wound of his left great toe. He underwent an arteriogram which showed that his superficial femoral artery was patent with single vessel runoff via the peroneal artery on the left. There was a stenosis in the distal common femoral artery it was felt the only option to improve circulation was endarterectomy. He underwent an endarterectomy of the left common femoral artery with vein patch angioplasty on 08/23/2013. He comes in for his first postoperative visit.  He denies fever or chills.  REVIEW OF SYSTEMS: Valu.Nieves ] denotes positive finding; [  ] denotes negative finding  CARDIOVASCULAR:  [ ]  chest pain   [ ]  dyspnea on exertion    CONSTITUTIONAL:  [ ]  fever   [ ]  chills  PHYSICAL EXAM: Filed Vitals:   09/28/13 0953  BP: 113/53  Pulse: 103  Height: 5\' 9"  (1.753 m)  Weight: 140 lb (63.504 kg)  SpO2: 100%   Body mass index is 20.67 kg/(m^2). GENERAL: The patient is a well-nourished male, in no acute distress. The vital signs are documented above. CARDIOVASCULAR: There is a regular rate and rhythm. He has a palpable left femoral pulse. He has a monophasic dorsalis pedis and posterior tibial signal with the Doppler. PULMONARY: There is good air exchange bilaterally without wheezing or rales. The wound on his left great toe has not changed significantly. There is some underlying bone palpable. He has a pulsatile mass in his left groin which could be either hematoma or pseudoaneurysm.  I have independently interpreted his arterial Doppler study which shows monophasic Doppler signals in the left posterior tibial and dorsalis pedis positions. ABI on the left to 70%. Toe pressure has improved to 38 mm of mercury.  DUPLEX LEFT GROIN: This shows a small hematoma but no pseudoaneurysm.  MEDICAL  ISSUES:  NONHEALING WOUND OF THE LEFT GREAT TOE: this toe pressure has improved significantly since his endarterectomy. Toe pressure has improved from 9 mm of mercury to 38 mm of mercury. ABI on the left to 70%. He still has marginal circulation for healing the left great toe amputation with single vessel runoff via the peroneal artery. We have had a long discussion about this in the past and he understands that it is unlikely that the wound in the left great toe will heal because there is some exposed bone. However at this point he did not want to have the toe taken off. I'll see him back in one month. He knows to call sooner for has problems.  Angelia Mould [facility] [place] Beeper: [phone]

## 2013-10-12 ENCOUNTER — Telehealth: Payer: Self-pay | Admitting: Oncology

## 2013-10-12 ENCOUNTER — Encounter: Payer: Self-pay | Admitting: Oncology

## 2013-10-12 ENCOUNTER — Ambulatory Visit (HOSPITAL_BASED_OUTPATIENT_CLINIC_OR_DEPARTMENT_OTHER): Payer: Medicare Other | Admitting: Oncology

## 2013-10-12 ENCOUNTER — Other Ambulatory Visit (HOSPITAL_BASED_OUTPATIENT_CLINIC_OR_DEPARTMENT_OTHER): Payer: Medicare Other

## 2013-10-12 VITALS — BP 100/50 | HR 73 | Temp 97.4°F | Resp 18 | Ht 69.0 in | Wt 141.7 lb

## 2013-10-12 DIAGNOSIS — Z86010 Personal history of colonic polyps: Secondary | ICD-10-CM

## 2013-10-12 DIAGNOSIS — N182 Chronic kidney disease, stage 2 (mild): Secondary | ICD-10-CM

## 2013-10-12 DIAGNOSIS — C61 Malignant neoplasm of prostate: Secondary | ICD-10-CM

## 2013-10-12 DIAGNOSIS — I4891 Unspecified atrial fibrillation: Secondary | ICD-10-CM

## 2013-10-12 DIAGNOSIS — I2510 Atherosclerotic heart disease of native coronary artery without angina pectoris: Secondary | ICD-10-CM

## 2013-10-12 DIAGNOSIS — D649 Anemia, unspecified: Secondary | ICD-10-CM

## 2013-10-12 DIAGNOSIS — Z7901 Long term (current) use of anticoagulants: Secondary | ICD-10-CM

## 2013-10-12 DIAGNOSIS — C3410 Malignant neoplasm of upper lobe, unspecified bronchus or lung: Secondary | ICD-10-CM

## 2013-10-12 DIAGNOSIS — I509 Heart failure, unspecified: Secondary | ICD-10-CM

## 2013-10-12 DIAGNOSIS — M899 Disorder of bone, unspecified: Secondary | ICD-10-CM

## 2013-10-12 DIAGNOSIS — C3490 Malignant neoplasm of unspecified part of unspecified bronchus or lung: Secondary | ICD-10-CM

## 2013-10-12 DIAGNOSIS — K649 Unspecified hemorrhoids: Secondary | ICD-10-CM

## 2013-10-12 DIAGNOSIS — E785 Hyperlipidemia, unspecified: Secondary | ICD-10-CM

## 2013-10-12 DIAGNOSIS — M069 Rheumatoid arthritis, unspecified: Secondary | ICD-10-CM

## 2013-10-12 DIAGNOSIS — M949 Disorder of cartilage, unspecified: Secondary | ICD-10-CM

## 2013-10-12 DIAGNOSIS — Z8679 Personal history of other diseases of the circulatory system: Secondary | ICD-10-CM

## 2013-10-12 DIAGNOSIS — I6529 Occlusion and stenosis of unspecified carotid artery: Secondary | ICD-10-CM

## 2013-10-12 LAB — CBC WITH DIFFERENTIAL/PLATELET
BASO%: 4.8 % — ABNORMAL HIGH (ref 0.0–2.0)
Basophils Absolute: 0.3 10*3/uL — ABNORMAL HIGH (ref 0.0–0.1)
EOS ABS: 0.1 10*3/uL (ref 0.0–0.5)
EOS%: 2.2 % (ref 0.0–7.0)
HCT: 30.1 % — ABNORMAL LOW (ref 38.4–49.9)
HGB: 9.6 g/dL — ABNORMAL LOW (ref 13.0–17.1)
LYMPH#: 1.1 10*3/uL (ref 0.9–3.3)
LYMPH%: 19.5 % (ref 14.0–49.0)
MCH: 27.8 pg (ref 27.2–33.4)
MCHC: 31.9 g/dL — ABNORMAL LOW (ref 32.0–36.0)
MCV: 87.1 fL (ref 79.3–98.0)
MONO#: 0.8 10*3/uL (ref 0.1–0.9)
MONO%: 14.3 % — ABNORMAL HIGH (ref 0.0–14.0)
NEUT%: 59.2 % (ref 39.0–75.0)
NEUTROS ABS: 3.4 10*3/uL (ref 1.5–6.5)
Platelets: 442 10*3/uL — ABNORMAL HIGH (ref 140–400)
RBC: 3.45 10*6/uL — ABNORMAL LOW (ref 4.20–5.82)
RDW: 16.8 % — AB (ref 11.0–14.6)
WBC: 5.7 10*3/uL (ref 4.0–10.3)

## 2013-10-12 LAB — COMPREHENSIVE METABOLIC PANEL (CC13)
ALBUMIN: 3.7 g/dL (ref 3.5–5.0)
ALT: 7 U/L (ref 0–55)
AST: 13 U/L (ref 5–34)
Alkaline Phosphatase: 93 U/L (ref 40–150)
Anion Gap: 10 mEq/L (ref 3–11)
BUN: 37.2 mg/dL — AB (ref 7.0–26.0)
CALCIUM: 10 mg/dL (ref 8.4–10.4)
CO2: 26 mEq/L (ref 22–29)
Chloride: 105 mEq/L (ref 98–109)
Creatinine: 1.7 mg/dL — ABNORMAL HIGH (ref 0.7–1.3)
GLUCOSE: 101 mg/dL (ref 70–140)
POTASSIUM: 3.9 meq/L (ref 3.5–5.1)
SODIUM: 141 meq/L (ref 136–145)
TOTAL PROTEIN: 7.5 g/dL (ref 6.4–8.3)
Total Bilirubin: 0.97 mg/dL (ref 0.20–1.20)

## 2013-10-12 MED ORDER — DARBEPOETIN ALFA-POLYSORBATE 300 MCG/0.6ML IJ SOLN
300.0000 ug | INTRAMUSCULAR | Status: DC
  Administered 2013-10-12: 300 ug via SUBCUTANEOUS
  Filled 2013-10-12: qty 0.6

## 2013-10-12 NOTE — Telephone Encounter (Signed)
Gave pt appt for lab and md for july 2015

## 2013-10-12 NOTE — Progress Notes (Signed)
Hematology and Oncology Follow Up Visit  [name] [id number] April 23, 1932 80 y.o. 10/12/2013 1:45 PM  CC: Darrick Penna. Leanne Chang, MD  Nicanor Alcon, M.D.  Judeth Cornfield. Scot Dock, M.D.    Principle Diagnosis: This is a 80 year old gentleman with the following diagnoses: 1. 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 2. Multifactorial anemia probably due to chronic disease and rheumatoid arthritis. 3. History of a stage T1c Gleason score 8 adenocarcinoma of the prostate S/P external beam radiation under the care of Dr. Sondra Come completed in 04/2011.  Current therapy:  1. He is on Aranesp at 300 mcg every 8 weeks to keep his hemoglobin above 10. 2. Watchful observation for his lung and prostate cancer.  Interim History:  This is a pleasant gentleman with the above history.  He presents today for a follow-up visit. Since the last visit, he underwent a left common femoral artery endarterectomy with vein patch angioplasty on 08/23/2013. He has tolerated it well and not reporting any complications. He has not reported any chest pain, has not reported any difficulty breathing, has not reported any cough or deterioration in his health. He continues to be on methotrexate on weekly bases for his rheumatoid arthritis.  No respiratory symptoms noted. No hemoptysis, hematemesis, hematuria, or melena. He reports no new illnesses or hospitalizations. He is reporting lower arthritis symptoms that is managed by low-dose prednisone. He is reporting more fatigue and slight dyspnea on exertion.  Medications: I have reviewed the patient's current medications.  Current Outpatient Prescriptions  Medication Sig Dispense Refill  . acetaminophen (TYLENOL) 325 MG tablet Take 650 mg by mouth every 6 (six) hours as needed. For pain or fever      . alendronate (FOSAMAX) 70 MG tablet Take 70 mg by mouth every Sunday. Take with a full glass of water on an empty  stomach.      Marland Kitchen aspirin EC 81 MG tablet Take 81 mg by mouth daily.      . [facility] IRON 325 (65 FE) MG tablet TAKE 1 TABLET BY MOUTH DAILY.  90 tablet  3  . diltiazem (CARDIZEM CD) 120 MG 24 hr capsule Take 120 mg by mouth daily.      Marland Kitchen enoxaparin (LOVENOX) 80 MG/0.8ML injection Inject 0.8 mLs (80 mg total) into the skin daily.  10 Syringe  0  . finasteride (PROSCAR) 5 MG tablet Take 5 mg by mouth daily.      . fluticasone (FLONASE) 50 MCG/ACT nasal spray Place 2 sprays into the nose daily as needed. For allergies  16 g  5  . folic acid (FOLVITE) 956 MCG tablet Take 400 mcg by mouth daily.       . furosemide (LASIX) 20 MG tablet TAKE 1 TABLET BY MOUTH EVERY DAY  30 tablet  3  . leflunomide (ARAVA) 20 MG tablet Take 20 mg by mouth daily.       Marland Kitchen lisinopril (PRINIVIL,ZESTRIL) 5 MG tablet Take 1 tablet (5 mg total) by mouth daily.  30 tablet  11  . metoprolol succinate (TOPROL-XL) 100 MG 24 hr tablet Take 100 mg by mouth daily. Take with or immediately following a meal.      . predniSONE (DELTASONE) 5 MG tablet Take 5 mg by mouth daily.       . simvastatin (ZOCOR) 40 MG tablet Take 40 mg by mouth daily.      Marland Kitchen warfarin (COUMADIN) 5 MG tablet Take 5  mg by mouth daily. 5mg  on Monday, Wednesday and Friday.  All other days take 2.5mg        Current Facility-Administered Medications  Medication Dose Route Frequency Provider Last Rate Last Dose  . darbepoetin (ARANESP) injection 300 mcg  300 mcg Subcutaneous Q28 days Maryanna Shape, NP   300 mcg at 10/12/13 1337    Allergies: No Known Allergies  Past Medical History, Surgical history, Social history, and Family History were reviewed and updated.  Review of Systems: Constitutional:  Negative for fever, chills, night sweats, anorexia, weight loss, pain.  Remaining ROS negative.  Physical Exam: Blood pressure 100/50, pulse 73, temperature 97.4 F (36.3 C), temperature source Oral, resp. rate 18, height 5\' 9"  (1.753 m), weight 141 lb 11.2 oz (64.275  kg), SpO2 91.00%. ECOG: 1 General appearance: alert Head: Normocephalic, without obvious abnormality, atraumatic Neck: no adenopathy, no carotid bruit, no JVD, supple, symmetrical, trachea midline and thyroid not enlarged, symmetric, no tenderness/mass/nodules Lymph nodes: Cervical, supraclavicular, and axillary nodes normal. Heart:regular rate and rhythm, S1, S2 normal, no murmur, click, rub or gallop Lung:chest clear, no wheezing, rales, normal symmetric air entry Abdomen: soft, non-tender, without masses or organomegaly EXT:no erythema, induration, or nodules  Lab Results: Lab Results  Component Value Date   WBC 5.7 10/12/2013   HGB 9.6* 10/12/2013   HCT 30.1* 10/12/2013   MCV 87.1 10/12/2013   PLT 442* 10/12/2013     Chemistry      Component Value Date/Time   NA 138 09/19/2013 0955   NA 140 08/12/2013 1416   NA 141 10/22/2011 0944   K 4.3 09/19/2013 0955   K 4.6 08/12/2013 1416   K 4.8* 10/22/2011 0944   CL 101 09/19/2013 0955   CL 111* 06/11/2012 1102   CL 98 10/22/2011 0944   CO2 25 09/19/2013 0955   CO2 26 08/12/2013 1416   CO2 26 10/22/2011 0944   BUN 33* 09/19/2013 0955   BUN 29.2* 08/12/2013 1416   BUN 29* 10/22/2011 0944   CREATININE 1.6* 09/19/2013 0955   CREATININE 1.5* 08/12/2013 1416   CREATININE 1.5* 10/22/2011 0944      Component Value Date/Time   CALCIUM 9.3 09/19/2013 0955   CALCIUM 9.8 08/12/2013 1416   CALCIUM 8.8 10/22/2011 0944   CALCIUM 9.4 10/14/2007 0110   ALKPHOS 80 09/19/2013 0955   ALKPHOS 91 08/12/2013 1416   ALKPHOS 76 10/22/2011 0944   AST 15 09/19/2013 0955   AST 15 08/12/2013 1416   AST 23 10/22/2011 0944   ALT 14 09/19/2013 0955   ALT 11 08/12/2013 1416   ALT 14 10/22/2011 0944   BILITOT 0.8 09/19/2013 0955   BILITOT 1.04 08/12/2013 1416   BILITOT 1.10 10/22/2011 0944      Impression and Plan: .  A 80 year old gentleman with the following issues: 1. Stage IB lung cancer status post surgical resection.  CT scan from 06/2012 continues to have no evidence of recurrent disease.   Continue on active surveillance. He will have another CT scan as needed. I will repeat a CXR instead in 12/2013.  2. Anemia, multifactorial.  Continue to receive Aranesp to keep his hemoglobin above 10.  Aranesp will be 300 mcg every 8 weeks instead of 4 weeks given the stability of his Hgb. Aranesp injection to be given today given his low hemoglobin 3. T1c Gleason score 8 adenocarcinoma of the prostate with a PSA of 14.2.  He is S/P  radiation therapy without evidence of relapse. PSA is followed by  Urology. 4. Follow-up. In 2 months.  Wyatt Portela                  5/6/20151:45 PM

## 2013-10-20 ENCOUNTER — Ambulatory Visit (INDEPENDENT_AMBULATORY_CARE_PROVIDER_SITE_OTHER): Payer: Medicare Other

## 2013-10-20 DIAGNOSIS — Z5181 Encounter for therapeutic drug level monitoring: Secondary | ICD-10-CM

## 2013-10-20 DIAGNOSIS — Z8679 Personal history of other diseases of the circulatory system: Secondary | ICD-10-CM

## 2013-10-20 DIAGNOSIS — I4891 Unspecified atrial fibrillation: Secondary | ICD-10-CM

## 2013-10-20 LAB — POCT INR: INR: 4.6

## 2013-10-25 ENCOUNTER — Encounter: Payer: Self-pay | Admitting: Vascular Surgery

## 2013-10-26 ENCOUNTER — Ambulatory Visit (INDEPENDENT_AMBULATORY_CARE_PROVIDER_SITE_OTHER): Payer: Self-pay | Admitting: Vascular Surgery

## 2013-10-26 ENCOUNTER — Encounter: Payer: Self-pay | Admitting: Vascular Surgery

## 2013-10-26 VITALS — BP 120/51 | HR 93 | Ht 69.0 in | Wt 140.7 lb

## 2013-10-26 DIAGNOSIS — Z5189 Encounter for other specified aftercare: Secondary | ICD-10-CM

## 2013-10-26 DIAGNOSIS — I7025 Atherosclerosis of native arteries of other extremities with ulceration: Secondary | ICD-10-CM | POA: Insufficient documentation

## 2013-10-26 DIAGNOSIS — I739 Peripheral vascular disease, unspecified: Secondary | ICD-10-CM | POA: Insufficient documentation

## 2013-10-26 DIAGNOSIS — L98499 Non-pressure chronic ulcer of skin of other sites with unspecified severity: Principal | ICD-10-CM

## 2013-10-26 NOTE — Progress Notes (Signed)
   Patient name: [name] MRN: [id number] DOB: 10-13-1931 Sex: male  REASON FOR VISIT: Follow up of left great toe wound.  HPI: [name] is a 80 y.o. male who had presented with a nonhealing wound of his left great toe. His arteriogram showed a stenosis in the left common femoral artery. He is single vessel runoff via the peroneal artery on the left. His only option for revascularization with a left femoral endarterectomy. He underwent left femoral endarterectomy and vein patch angioplasty on 08/23/2013. We have been following his left great toe wound. In the past he has been reluctant to consider toe amputation.  Of note, at the time of his last visit his toe pressure had improved from 9 mmHg to 30 mm mercury after his procedure. However this does suggest marginal perfusion for healing. ABI on the left to 70% at that time.  In addition, when I saw him last he had a pulsatile mass in the left groin. However duplex showed that this was only a hematoma and not a pseudoaneurysm. He states that several days ago he developed some separation of his left groin incision. He has been keeping a dressing on this. He denies fever or chills.  REVIEW OF SYSTEMS: Valu.Nieves ] denotes positive finding; [  ] denotes negative finding  CARDIOVASCULAR:  [ ]  chest pain   [ ]  dyspnea on exertion    CONSTITUTIONAL:  [ ]  fever   [ ]  chills  PHYSICAL EXAM: Filed Vitals:   10/26/13 0900  BP: 120/51  Pulse: 93  Height: 5\' 9"  (1.753 m)  Weight: 140 lb 11.2 oz (63.821 kg)  SpO2: 93%   Body mass index is 20.77 kg/(m^2). GENERAL: The patient is a well-nourished male, in no acute distress. The vital signs are documented above. He has a small open area in his left groin which I packed with a moist 2 x 2. I think we can get this to heal with twice a day dressing changes. The left great toe is unchanged. He has some exposed bone. He has a brisk peroneal and posterior tibial signal with the Doppler. I cannot obtain a  dorsalis pedis signal.  MEDICAL ISSUES: This patient has any nonhealing wound of the left great toe with some exposed bone. However there is no erythema or drainage. He is very reluctant to consider tony dictation the past and given that the wound is stable I do not think this is unreasonable. He understands if he develops any redness or drainage that we would need to proceed with toe amputation with some risk of nonhealing given his tibial artery occlusive disease.  Respect to his groin incision which is opened. I've instructed him to pack the wound twice a day with a moist 2 x 2 and do wet-to-dry dressing changes. I think this will heal with meticulous wound care. The artery was patched with saphenous vein so there is no prosthetic material here. Do not see signs of infection at this point and therefore did not put him on antibiotics.  Angelia Mould [facility] [place] Beeper: [phone]

## 2013-11-01 ENCOUNTER — Encounter: Payer: Self-pay | Admitting: Vascular Surgery

## 2013-11-02 ENCOUNTER — Encounter: Payer: Self-pay | Admitting: Vascular Surgery

## 2013-11-02 ENCOUNTER — Ambulatory Visit (INDEPENDENT_AMBULATORY_CARE_PROVIDER_SITE_OTHER): Payer: Self-pay | Admitting: Vascular Surgery

## 2013-11-02 VITALS — BP 129/60 | HR 74 | Temp 97.8°F | Ht 69.0 in | Wt 143.0 lb

## 2013-11-02 DIAGNOSIS — I739 Peripheral vascular disease, unspecified: Secondary | ICD-10-CM

## 2013-11-02 DIAGNOSIS — Z5189 Encounter for other specified aftercare: Secondary | ICD-10-CM

## 2013-11-02 NOTE — Progress Notes (Signed)
   Patient name: [name] MRN: [id number] DOB: January 16, 1932 Sex: male  REASON FOR VISIT: Follow up of left groin wound  HPI: [name] is a 80 y.o. male who had originally presented with a nonhealing wound of his left great toe. The only disease that could be addressed was a stenosis in the left common femoral artery. He had tibial artery occlusive disease with single vessel runoff via the peroneal artery on the left. He underwent a left femoral endarterectomy and vein patch angioplasty on 08/23/2013. When I saw him for his first postoperative visit he had a pulsatile mass in his left groin. Duplex however did not show a pseudoaneurysm but simply a hematoma. When I saw him on 10/26/2013, he had developed an opening in his wound that measured 2 cm x 2 cm. We instructed him on dressing changes and he comes in for a one week follow up visit. Of note the vein patch was done with saphenous vein and there is no prosthetic material in the left groin.  He has been doing his dressing changes twice a day. He denies fever or chills.  REVIEW OF SYSTEMS: Valu.Nieves ] denotes positive finding; [  ] denotes negative finding  CARDIOVASCULAR:  [ ]  chest pain   [ ]  dyspnea on exertion    CONSTITUTIONAL:  [ ]  fever   [ ]  chills  PHYSICAL EXAM: Filed Vitals:   11/02/13 1313  BP: 129/60  Pulse: 74  Temp: 97.8 F (36.6 C)  TempSrc: Oral  Height: 5\' 9"  (1.753 m)  Weight: 143 lb (64.864 kg)  SpO2: 98%   Body mass index is 21.11 kg/(m^2). GENERAL: The patient is a well-nourished male, in no acute distress. The vital signs are documented above. CARDIOVASCULAR: There is a regular rate and rhythm. PULMONARY: There is good air exchange bilaterally without wheezing or rales. The wound in the left groin is healing nicely and is clean without significant drainage. There is good granulation tissue. It measures 10 mm x 15 mm in his best decreased in size. He has some exposed bone of the metatarsal head of the left great  toe. There is no change in this wound.  MEDICAL ISSUES: I will have him do his dressing changes with hydrogel to keep the wound moist. I'll see him back in 3 weeks. Of note there has been no significant change in the toe on the left foot. He does not wish to proceed with toe amputation unless this wound progresses.  Angelia Mould [facility] [place] Beeper: [phone]

## 2013-11-03 ENCOUNTER — Ambulatory Visit (INDEPENDENT_AMBULATORY_CARE_PROVIDER_SITE_OTHER): Payer: Medicare Other | Admitting: *Deleted

## 2013-11-03 DIAGNOSIS — I4891 Unspecified atrial fibrillation: Secondary | ICD-10-CM

## 2013-11-03 DIAGNOSIS — Z8679 Personal history of other diseases of the circulatory system: Secondary | ICD-10-CM

## 2013-11-03 DIAGNOSIS — Z5181 Encounter for therapeutic drug level monitoring: Secondary | ICD-10-CM

## 2013-11-03 LAB — POCT INR: INR: 2.1

## 2013-11-22 ENCOUNTER — Encounter: Payer: Self-pay | Admitting: Vascular Surgery

## 2013-11-23 ENCOUNTER — Ambulatory Visit (INDEPENDENT_AMBULATORY_CARE_PROVIDER_SITE_OTHER): Payer: Self-pay | Admitting: Vascular Surgery

## 2013-11-23 ENCOUNTER — Encounter: Payer: Self-pay | Admitting: Vascular Surgery

## 2013-11-23 ENCOUNTER — Ambulatory Visit (INDEPENDENT_AMBULATORY_CARE_PROVIDER_SITE_OTHER): Payer: Medicare Other | Admitting: *Deleted

## 2013-11-23 VITALS — BP 106/55 | HR 95 | Ht 69.0 in | Wt 144.5 lb

## 2013-11-23 DIAGNOSIS — I4891 Unspecified atrial fibrillation: Secondary | ICD-10-CM

## 2013-11-23 DIAGNOSIS — Z48812 Encounter for surgical aftercare following surgery on the circulatory system: Secondary | ICD-10-CM

## 2013-11-23 DIAGNOSIS — Z5189 Encounter for other specified aftercare: Secondary | ICD-10-CM

## 2013-11-23 DIAGNOSIS — Z5181 Encounter for therapeutic drug level monitoring: Secondary | ICD-10-CM

## 2013-11-23 DIAGNOSIS — I739 Peripheral vascular disease, unspecified: Secondary | ICD-10-CM

## 2013-11-23 DIAGNOSIS — Z8679 Personal history of other diseases of the circulatory system: Secondary | ICD-10-CM

## 2013-11-23 LAB — POCT INR: INR: 2.9

## 2013-11-23 NOTE — Addendum Note (Signed)
Addended by: Mena Goes on: 11/23/2013 04:45 PM   Modules accepted: Orders

## 2013-11-23 NOTE — Progress Notes (Signed)
   Patient name: [name] MRN: [id number] DOB: 1932-01-17 Sex: male  REASON FOR VISIT: follow up of left groin wound.  HPI: [name] is a 80 y.o. male who underwent left common femoral artery endarterectomy and patch angioplasty. He developed a wound in the left groin and he has been doing dressing changes. He comes in for a routine wound check. He denies fever or chills. He denies pain in his left great toe where he has a wound.  REVIEW OF SYSTEMS: Valu.Nieves ] denotes positive finding; [  ] denotes negative finding  CARDIOVASCULAR:  [ ]  chest pain   [ ]  dyspnea on exertion    CONSTITUTIONAL:  [ ]  fever   [ ]  chills  PHYSICAL EXAM: Filed Vitals:   11/23/13 1401  BP: 106/55  Pulse: 95  Height: 5\' 9"  (1.753 m)  Weight: 144 lb 8 oz (65.545 kg)  SpO2: 100%   Body mass index is 21.33 kg/(m^2). GENERAL: The patient is a well-nourished male, in no acute distress. The vital signs are documented above. CARDIOVASCULAR: There is a regular rate and rhythm. PULMONARY: There is good air exchange bilaterally without wheezing or rales. The wound in the left groin is almost completely healed. It is approximately 5 mm in diameter. There is no change in the left great toe wound.  MEDICAL ISSUES: I'll see him back in 6 months. He knows to call sooner if he has problems. If the wound on the toe progresses then he would require toe amputation. He has been reluctant to proceed with toe amp however unless it becomes absolutely necessary. We will check ABIs when he returns in 6 months.  [place] [place] Beeper: [phone]

## 2013-12-13 ENCOUNTER — Ambulatory Visit (HOSPITAL_COMMUNITY)
Admission: RE | Admit: 2013-12-13 | Discharge: 2013-12-13 | Disposition: A | Discharge status: Home / Self Care | Payer: Medicare Other | Source: Ambulatory Visit | Attending: Oncology | Admitting: Oncology

## 2013-12-13 ENCOUNTER — Other Ambulatory Visit (HOSPITAL_BASED_OUTPATIENT_CLINIC_OR_DEPARTMENT_OTHER): Payer: Medicare Other

## 2013-12-13 DIAGNOSIS — N182 Chronic kidney disease, stage 2 (mild): Secondary | ICD-10-CM

## 2013-12-13 DIAGNOSIS — K649 Unspecified hemorrhoids: Secondary | ICD-10-CM

## 2013-12-13 DIAGNOSIS — C3410 Malignant neoplasm of upper lobe, unspecified bronchus or lung: Secondary | ICD-10-CM

## 2013-12-13 DIAGNOSIS — Z85118 Personal history of other malignant neoplasm of bronchus and lung: Secondary | ICD-10-CM | POA: Insufficient documentation

## 2013-12-13 DIAGNOSIS — I6529 Occlusion and stenosis of unspecified carotid artery: Secondary | ICD-10-CM

## 2013-12-13 DIAGNOSIS — Z7901 Long term (current) use of anticoagulants: Secondary | ICD-10-CM

## 2013-12-13 DIAGNOSIS — Z8679 Personal history of other diseases of the circulatory system: Secondary | ICD-10-CM

## 2013-12-13 DIAGNOSIS — D649 Anemia, unspecified: Secondary | ICD-10-CM

## 2013-12-13 DIAGNOSIS — Z86010 Personal history of colonic polyps: Secondary | ICD-10-CM

## 2013-12-13 DIAGNOSIS — M069 Rheumatoid arthritis, unspecified: Secondary | ICD-10-CM

## 2013-12-13 DIAGNOSIS — J90 Pleural effusion, not elsewhere classified: Secondary | ICD-10-CM | POA: Insufficient documentation

## 2013-12-13 DIAGNOSIS — E785 Hyperlipidemia, unspecified: Secondary | ICD-10-CM

## 2013-12-13 DIAGNOSIS — I4891 Unspecified atrial fibrillation: Secondary | ICD-10-CM

## 2013-12-13 DIAGNOSIS — M949 Disorder of cartilage, unspecified: Secondary | ICD-10-CM

## 2013-12-13 DIAGNOSIS — I509 Heart failure, unspecified: Secondary | ICD-10-CM

## 2013-12-13 DIAGNOSIS — C61 Malignant neoplasm of prostate: Secondary | ICD-10-CM

## 2013-12-13 DIAGNOSIS — I2510 Atherosclerotic heart disease of native coronary artery without angina pectoris: Secondary | ICD-10-CM

## 2013-12-13 DIAGNOSIS — M899 Disorder of bone, unspecified: Secondary | ICD-10-CM

## 2013-12-13 DIAGNOSIS — Z951 Presence of aortocoronary bypass graft: Secondary | ICD-10-CM | POA: Insufficient documentation

## 2013-12-13 DIAGNOSIS — C3490 Malignant neoplasm of unspecified part of unspecified bronchus or lung: Secondary | ICD-10-CM

## 2013-12-13 LAB — COMPREHENSIVE METABOLIC PANEL (CC13)
ALBUMIN: 3.4 g/dL — AB (ref 3.5–5.0)
ALT: 13 U/L (ref 0–55)
AST: 14 U/L (ref 5–34)
Alkaline Phosphatase: 81 U/L (ref 40–150)
Anion Gap: 11 mEq/L (ref 3–11)
BILIRUBIN TOTAL: 1 mg/dL (ref 0.20–1.20)
BUN: 19.4 mg/dL (ref 7.0–26.0)
CO2: 26 mEq/L (ref 22–29)
Calcium: 9.1 mg/dL (ref 8.4–10.4)
Chloride: 105 mEq/L (ref 98–109)
Creatinine: 1.3 mg/dL (ref 0.7–1.3)
GLUCOSE: 92 mg/dL (ref 70–140)
Potassium: 3.5 mEq/L (ref 3.5–5.1)
Sodium: 141 mEq/L (ref 136–145)
Total Protein: 6.9 g/dL (ref 6.4–8.3)

## 2013-12-13 LAB — CBC WITH DIFFERENTIAL/PLATELET
BASO%: 3.9 % — ABNORMAL HIGH (ref 0.0–2.0)
BASOS ABS: 0.2 10*3/uL — AB (ref 0.0–0.1)
EOS%: 2.8 % (ref 0.0–7.0)
Eosinophils Absolute: 0.2 10*3/uL (ref 0.0–0.5)
HCT: 31.6 % — ABNORMAL LOW (ref 38.4–49.9)
HGB: 10 g/dL — ABNORMAL LOW (ref 13.0–17.1)
LYMPH%: 15 % (ref 14.0–49.0)
MCH: 28.2 pg (ref 27.2–33.4)
MCHC: 31.5 g/dL — ABNORMAL LOW (ref 32.0–36.0)
MCV: 89.8 fL (ref 79.3–98.0)
MONO#: 1 10*3/uL — ABNORMAL HIGH (ref 0.1–0.9)
MONO%: 16.5 % — ABNORMAL HIGH (ref 0.0–14.0)
NEUT#: 3.8 10*3/uL (ref 1.5–6.5)
NEUT%: 61.8 % (ref 39.0–75.0)
PLATELETS: 396 10*3/uL (ref 140–400)
RBC: 3.53 10*6/uL — AB (ref 4.20–5.82)
RDW: 17.1 % — AB (ref 11.0–14.6)
WBC: 6.1 10*3/uL (ref 4.0–10.3)
lymph#: 0.9 10*3/uL (ref 0.9–3.3)

## 2013-12-15 ENCOUNTER — Encounter: Payer: Self-pay | Admitting: Oncology

## 2013-12-15 ENCOUNTER — Ambulatory Visit (HOSPITAL_BASED_OUTPATIENT_CLINIC_OR_DEPARTMENT_OTHER): Payer: Medicare Other | Admitting: Oncology

## 2013-12-15 ENCOUNTER — Telehealth: Payer: Self-pay | Admitting: Oncology

## 2013-12-15 VITALS — BP 120/59 | HR 91 | Temp 97.8°F | Resp 18 | Ht 69.0 in | Wt 149.3 lb

## 2013-12-15 DIAGNOSIS — D649 Anemia, unspecified: Secondary | ICD-10-CM

## 2013-12-15 DIAGNOSIS — Z8546 Personal history of malignant neoplasm of prostate: Secondary | ICD-10-CM

## 2013-12-15 DIAGNOSIS — C3410 Malignant neoplasm of upper lobe, unspecified bronchus or lung: Secondary | ICD-10-CM

## 2013-12-15 DIAGNOSIS — C3490 Malignant neoplasm of unspecified part of unspecified bronchus or lung: Secondary | ICD-10-CM

## 2013-12-15 NOTE — Telephone Encounter (Signed)
gv and printed appt sched and avs fo rpt; for Sept, NOV and Jan

## 2013-12-15 NOTE — Progress Notes (Signed)
Hematology and Oncology Follow Up Visit  [name] [id number] [birth year] 80 y.o. 12/15/2013 12:59 PM  CC: [name]. Logan Chang, MD  [name], M.D.  [name]. Scot Dock, M.D.    Principle Diagnosis: This is a 80 year old gentleman with the following diagnoses: 1. 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 2. Multifactorial anemia probably due to chronic disease and rheumatoid arthritis. 3. History of a stage T1c Gleason score 8 adenocarcinoma of the prostate S/P external beam radiation under the care of Dr. Sondra Come completed in 04/2011.  Current therapy:  1. He is on Aranesp at 300 mcg every 8 weeks to keep his hemoglobin above 10. 2. Watchful observation for his lung and prostate cancer.  Interim History: [name]  presents today for a follow-up visit. Since the last visit, he is not reporting any complications. He has not reported any chest pain, has not reported any difficulty breathing, has not reported any cough or deterioration in his health. He continues to be on methotrexate on weekly bases for his rheumatoid arthritis.  No respiratory symptoms noted. No hemoptysis, hematemesis, hematuria, or melena. He reports no new illnesses or hospitalizations. He is reporting lower arthritis symptoms that is managed by low-dose prednisone. He is reporting more fatigue and slight dyspnea on exertion. He does not report any headaches or blurry vision or double vision. Did not report any neurological symptoms. Does not report any fevers or chills or sweats. He continues to have reasonable quality of life and the rest of his review of systems unremarkable.  Medications: I have reviewed the patient's current medications.  Current Outpatient Prescriptions  Medication Sig Dispense Refill  . acetaminophen (TYLENOL) 325 MG tablet Take 650 mg by mouth every 6 (six) hours as needed. For pain or fever      . alendronate (FOSAMAX) 70  MG tablet Take 70 mg by mouth every Sunday. Take with a full glass of water on an empty stomach.      Marland Kitchen aspirin EC 81 MG tablet Take 81 mg by mouth daily.      . [facility] IRON 325 (65 FE) MG tablet TAKE 1 TABLET BY MOUTH DAILY.  90 tablet  3  . diltiazem (CARDIZEM CD) 120 MG 24 hr capsule Take 120 mg by mouth daily.      Marland Kitchen enoxaparin (LOVENOX) 80 MG/0.8ML injection Inject 0.8 mLs (80 mg total) into the skin daily.  10 Syringe  0  . finasteride (PROSCAR) 5 MG tablet Take 5 mg by mouth daily.      . fluticasone (FLONASE) 50 MCG/ACT nasal spray Place 2 sprays into the nose daily as needed. For allergies  16 g  5  . folic acid (FOLVITE) 892 MCG tablet Take 400 mcg by mouth daily.       . furosemide (LASIX) 20 MG tablet TAKE 1 TABLET BY MOUTH EVERY DAY  30 tablet  3  . leflunomide (ARAVA) 20 MG tablet Take 20 mg by mouth daily.       Marland Kitchen lisinopril (PRINIVIL,ZESTRIL) 5 MG tablet Take 1 tablet (5 mg total) by mouth daily.  30 tablet  11  . metoprolol succinate (TOPROL-XL) 100 MG 24 hr tablet Take 100 mg by mouth daily. Take with or immediately following a meal.      . predniSONE (DELTASONE) 5 MG tablet Take 5 mg by mouth daily.       . simvastatin (ZOCOR) 40 MG tablet Take 40  mg by mouth daily.      Marland Kitchen sulfamethoxazole-trimethoprim (BACTRIM DS) 800-160 MG per tablet Take 1 tablet by mouth 2 (two) times daily.      Marland Kitchen warfarin (COUMADIN) 5 MG tablet Take 5 mg by mouth daily. 5mg  on Monday, Wednesday and Friday.  All other days take 2.5mg        No current facility-administered medications for this visit.    Allergies: No Known Allergies  Past Medical History, Surgical history, Social history, and Family History were reviewed and updated.    Physical Exam: Blood pressure 120/59, pulse 91, temperature 97.8 F (36.6 C), temperature source Oral, resp. rate 18, height 5\' 9"  (1.753 m), weight 149 lb 4.8 oz (67.722 kg), SpO2 100.00%. ECOG: 1 General appearance: alert Head: Normocephalic, without obvious  abnormality, atraumatic Neck: no adenopathy Lymph nodes: Cervical, supraclavicular, and axillary nodes normal. Heart:regular rate and rhythm, S1, S2 normal, no murmur, click, rub or gallop Lung:chest clear, no wheezing, rales, normal symmetric air entry Abdomen: soft, non-tender, without masses or organomegaly EXT:no erythema, induration, or nodules  Lab Results: Lab Results  Component Value Date   WBC 6.1 12/13/2013   HGB 10.0* 12/13/2013   HCT 31.6* 12/13/2013   MCV 89.8 12/13/2013   PLT 396 12/13/2013     Chemistry      Component Value Date/Time   NA 141 12/13/2013 1305   NA 138 09/19/2013 0955   NA 141 10/22/2011 0944   K 3.5 12/13/2013 1305   K 4.3 09/19/2013 0955   K 4.8* 10/22/2011 0944   CL 101 09/19/2013 0955   CL 111* 06/11/2012 1102   CL 98 10/22/2011 0944   CO2 26 12/13/2013 1305   CO2 25 09/19/2013 0955   CO2 26 10/22/2011 0944   BUN 19.4 12/13/2013 1305   BUN 33* 09/19/2013 0955   BUN 29* 10/22/2011 0944   CREATININE 1.3 12/13/2013 1305   CREATININE 1.6* 09/19/2013 0955   CREATININE 1.5* 10/22/2011 0944      Component Value Date/Time   CALCIUM 9.1 12/13/2013 1305   CALCIUM 9.3 09/19/2013 0955   CALCIUM 8.8 10/22/2011 0944   CALCIUM 9.4 10/14/2007 0110   ALKPHOS 81 12/13/2013 1305   ALKPHOS 80 09/19/2013 0955   ALKPHOS 76 10/22/2011 0944   AST 14 12/13/2013 1305   AST 15 09/19/2013 0955   AST 23 10/22/2011 0944   ALT 13 12/13/2013 1305   ALT 14 09/19/2013 0955   ALT 14 10/22/2011 0944   BILITOT 1.00 12/13/2013 1305   BILITOT 0.8 09/19/2013 0955   BILITOT 1.10 10/22/2011 0944     EXAM:  CHEST 2 VIEW  COMPARISON: PA and lateral chest 08/18/2013.  FINDINGS:  There has been some increase in a small right pleural effusion. The  lungs are emphysematous. No consolidative process, nodule or mass is  identified. The patient is status post CABG.  IMPRESSION:  Some increase in a small right pleural effusion. No other change  since the prior study with emphysema again noted.   Impression and Plan: .   A 80 year old gentleman with the following issues: 1. Stage IB lung cancer status post surgical resection.  CT scan from 06/2012 and CXR on 12/13/2013 continues to have no evidence of recurrent disease.  Continue on active surveillance. He will have another CT scan as needed. I will repeat a CXR instead in 12/2014. 2. Anemia, multifactorial.  Continue to receive Aranesp to keep his hemoglobin above 10.  Aranesp will be 300 mcg every 8 weeks instead  of 4 weeks given the stability of his Hgb. Aranesp injection will not be given today,  3. T1c Gleason score 8 adenocarcinoma of the prostate with a PSA of 14.2.  He is S/P  radiation therapy without evidence of relapse. PSA is followed by Urology. 4. Follow-up. In 6 months.  [place] [place] [facility] MD 12/15/2013

## 2013-12-21 ENCOUNTER — Ambulatory Visit (INDEPENDENT_AMBULATORY_CARE_PROVIDER_SITE_OTHER): Payer: Medicare Other | Admitting: Pharmacist

## 2013-12-21 DIAGNOSIS — I4891 Unspecified atrial fibrillation: Secondary | ICD-10-CM

## 2013-12-21 DIAGNOSIS — Z8679 Personal history of other diseases of the circulatory system: Secondary | ICD-10-CM

## 2013-12-21 DIAGNOSIS — Z5181 Encounter for therapeutic drug level monitoring: Secondary | ICD-10-CM

## 2013-12-21 LAB — POCT INR: INR: 1.8

## 2013-12-25 ENCOUNTER — Other Ambulatory Visit: Payer: Self-pay | Admitting: Internal Medicine

## 2014-01-04 ENCOUNTER — Ambulatory Visit (INDEPENDENT_AMBULATORY_CARE_PROVIDER_SITE_OTHER): Payer: Medicare Other | Admitting: Surgery

## 2014-01-04 DIAGNOSIS — I4891 Unspecified atrial fibrillation: Secondary | ICD-10-CM

## 2014-01-04 DIAGNOSIS — Z8679 Personal history of other diseases of the circulatory system: Secondary | ICD-10-CM

## 2014-01-04 DIAGNOSIS — Z5181 Encounter for therapeutic drug level monitoring: Secondary | ICD-10-CM

## 2014-01-04 LAB — POCT INR: INR: 2

## 2014-01-12 ENCOUNTER — Other Ambulatory Visit (INDEPENDENT_AMBULATORY_CARE_PROVIDER_SITE_OTHER): Payer: Medicare Other

## 2014-01-12 DIAGNOSIS — I10 Essential (primary) hypertension: Secondary | ICD-10-CM

## 2014-01-12 LAB — BASIC METABOLIC PANEL
BUN: 17 mg/dL (ref 6–23)
CO2: 26 meq/L (ref 19–32)
CREATININE: 1.4 mg/dL (ref 0.4–1.5)
Calcium: 9.3 mg/dL (ref 8.4–10.5)
Chloride: 102 mEq/L (ref 96–112)
GFR: 60.69 mL/min (ref >60.00)
Glucose, Bld: 104 mg/dL — ABNORMAL HIGH (ref 70–99)
POTASSIUM: 3.9 meq/L (ref 3.5–5.1)
SODIUM: 138 meq/L (ref 135–145)

## 2014-01-21 ENCOUNTER — Other Ambulatory Visit: Payer: Self-pay | Admitting: Cardiovascular Disease

## 2014-01-26 ENCOUNTER — Ambulatory Visit (INDEPENDENT_AMBULATORY_CARE_PROVIDER_SITE_OTHER): Payer: Medicare Other | Admitting: Pharmacist

## 2014-01-26 DIAGNOSIS — Z5181 Encounter for therapeutic drug level monitoring: Secondary | ICD-10-CM

## 2014-01-26 DIAGNOSIS — Z8679 Personal history of other diseases of the circulatory system: Secondary | ICD-10-CM

## 2014-01-26 DIAGNOSIS — I4891 Unspecified atrial fibrillation: Secondary | ICD-10-CM

## 2014-01-26 LAB — POCT INR: INR: 1.8

## 2014-02-09 ENCOUNTER — Ambulatory Visit: Payer: Medicare Other

## 2014-02-09 ENCOUNTER — Ambulatory Visit (INDEPENDENT_AMBULATORY_CARE_PROVIDER_SITE_OTHER): Payer: Medicare Other | Admitting: *Deleted

## 2014-02-09 ENCOUNTER — Other Ambulatory Visit (HOSPITAL_BASED_OUTPATIENT_CLINIC_OR_DEPARTMENT_OTHER): Payer: Medicare Other

## 2014-02-09 DIAGNOSIS — K649 Unspecified hemorrhoids: Secondary | ICD-10-CM

## 2014-02-09 DIAGNOSIS — E785 Hyperlipidemia, unspecified: Secondary | ICD-10-CM

## 2014-02-09 DIAGNOSIS — C3410 Malignant neoplasm of upper lobe, unspecified bronchus or lung: Secondary | ICD-10-CM

## 2014-02-09 DIAGNOSIS — D649 Anemia, unspecified: Secondary | ICD-10-CM

## 2014-02-09 DIAGNOSIS — Z8679 Personal history of other diseases of the circulatory system: Secondary | ICD-10-CM

## 2014-02-09 DIAGNOSIS — M899 Disorder of bone, unspecified: Secondary | ICD-10-CM

## 2014-02-09 DIAGNOSIS — N182 Chronic kidney disease, stage 2 (mild): Secondary | ICD-10-CM

## 2014-02-09 DIAGNOSIS — C3490 Malignant neoplasm of unspecified part of unspecified bronchus or lung: Secondary | ICD-10-CM

## 2014-02-09 DIAGNOSIS — Z5181 Encounter for therapeutic drug level monitoring: Secondary | ICD-10-CM

## 2014-02-09 DIAGNOSIS — I4891 Unspecified atrial fibrillation: Secondary | ICD-10-CM

## 2014-02-09 DIAGNOSIS — I2510 Atherosclerotic heart disease of native coronary artery without angina pectoris: Secondary | ICD-10-CM

## 2014-02-09 DIAGNOSIS — I509 Heart failure, unspecified: Secondary | ICD-10-CM

## 2014-02-09 DIAGNOSIS — Z7901 Long term (current) use of anticoagulants: Secondary | ICD-10-CM

## 2014-02-09 DIAGNOSIS — M069 Rheumatoid arthritis, unspecified: Secondary | ICD-10-CM

## 2014-02-09 DIAGNOSIS — I6529 Occlusion and stenosis of unspecified carotid artery: Secondary | ICD-10-CM

## 2014-02-09 DIAGNOSIS — C61 Malignant neoplasm of prostate: Secondary | ICD-10-CM

## 2014-02-09 DIAGNOSIS — Z86010 Personal history of colonic polyps: Secondary | ICD-10-CM

## 2014-02-09 DIAGNOSIS — M949 Disorder of cartilage, unspecified: Secondary | ICD-10-CM

## 2014-02-09 LAB — CBC WITH DIFFERENTIAL/PLATELET
BASO%: 1.4 % (ref 0.0–2.0)
Basophils Absolute: 0.1 10*3/uL (ref 0.0–0.1)
EOS ABS: 0.1 10*3/uL (ref 0.0–0.5)
EOS%: 2.1 % (ref 0.0–7.0)
HEMATOCRIT: 34.6 % — AB (ref 38.4–49.9)
HEMOGLOBIN: 11 g/dL — AB (ref 13.0–17.1)
LYMPH#: 1.1 10*3/uL (ref 0.9–3.3)
LYMPH%: 19.9 % (ref 14.0–49.0)
MCH: 28.2 pg (ref 27.2–33.4)
MCHC: 31.8 g/dL — AB (ref 32.0–36.0)
MCV: 88.7 fL (ref 79.3–98.0)
MONO#: 1.4 10*3/uL — AB (ref 0.1–0.9)
MONO%: 24.3 % — ABNORMAL HIGH (ref 0.0–14.0)
NEUT%: 52.3 % (ref 39.0–75.0)
NEUTROS ABS: 2.9 10*3/uL (ref 1.5–6.5)
PLATELETS: 570 10*3/uL — AB (ref 140–400)
RBC: 3.9 10*6/uL — ABNORMAL LOW (ref 4.20–5.82)
RDW: 17.6 % — ABNORMAL HIGH (ref 11.0–14.6)
WBC: 5.6 10*3/uL (ref 4.0–10.3)
nRBC: 0 % (ref 0–0)

## 2014-02-09 LAB — POCT INR: INR: 2.7

## 2014-02-09 MED ORDER — DARBEPOETIN ALFA-POLYSORBATE 300 MCG/0.6ML IJ SOLN: 300.0000 ug | INTRAMUSCULAR | Status: DC

## 2014-02-10 IMAGING — CT CT ABD-PELV W/ CM
2 of 6 series · 16 of 46 positions shown, 18 images · IV contrast (OMNIPAQUE)
Comparison: Multiple exams, including 10/22/2011

CT CHEST

CLINICAL DATA: Lung cancer, with right lower lobectomy on
04/27/2008.  Prostate cancer diagnosed in 4202, radiotherapy
completed.

CT CHEST, ABDOMEN AND PELVIS WITH CONTRAST
TECHNIQUE: Multidetector CT imaging of the chest, abdomen and
pelvis was performed following the standard protocol during bolus
administration of intravenous contrast.
Contrast: 100mL OMNIPAQUE IOHEXOL 300 MG/ML  SOLN

[Series 2: cap with st · axial · 0.72mm/px · z∈[-672,-87]mm · 13 of 135 slices shown, 15 images]
[im 9/135  soft-tissue]
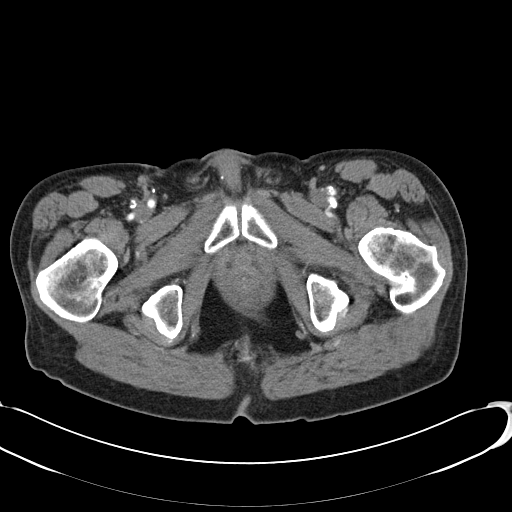
[im 9/135  bone]
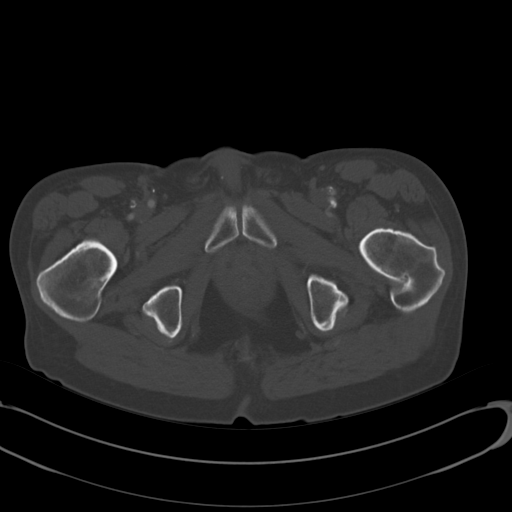
[im 18/135  soft-tissue]
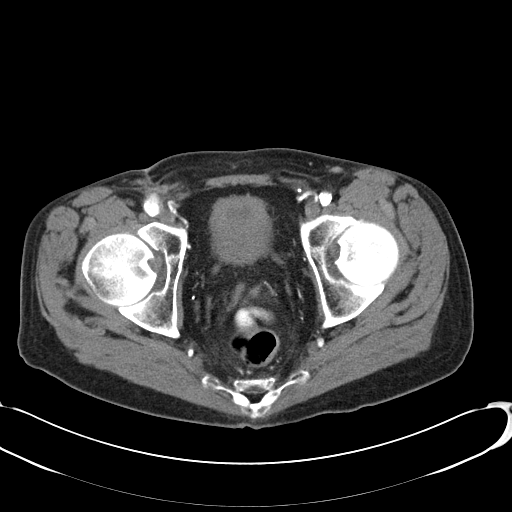
[im 27/135  soft-tissue]
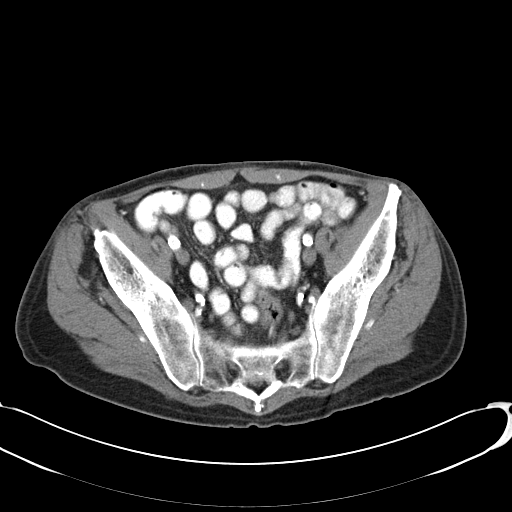
[im 36/135  soft-tissue]
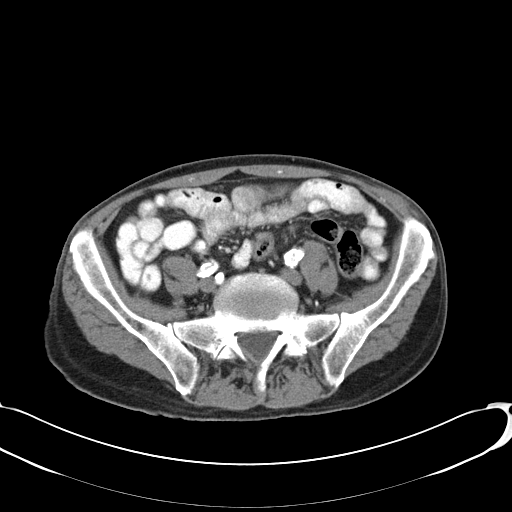
[im 45/135  soft-tissue]
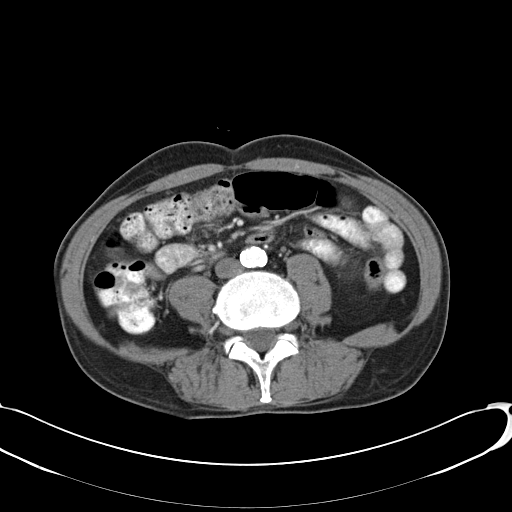
[im 54/135  soft-tissue]
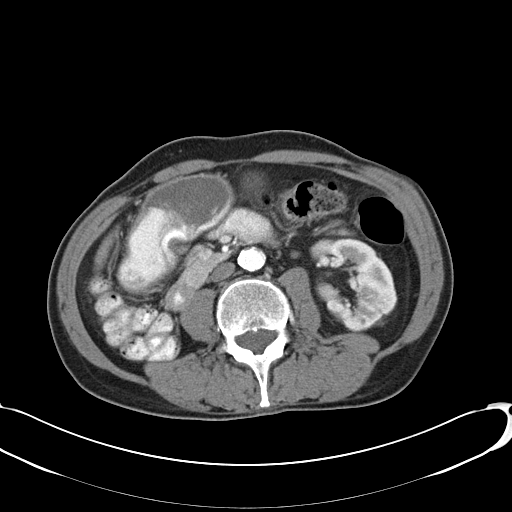
[im 72/135  soft-tissue]
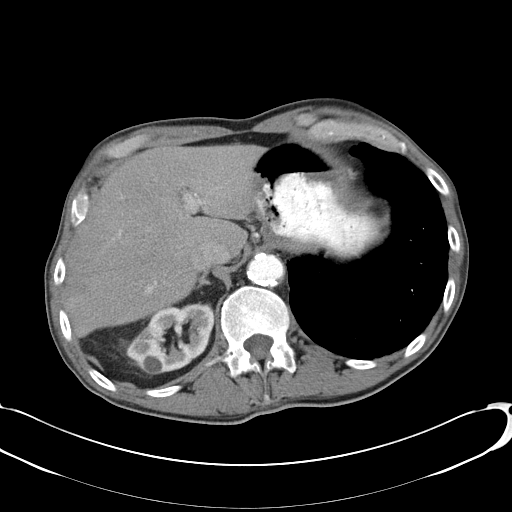
[im 81/135  soft-tissue]
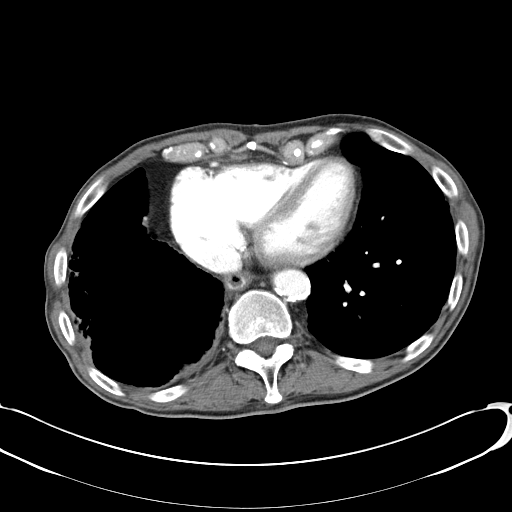
[im 90/135  soft-tissue]
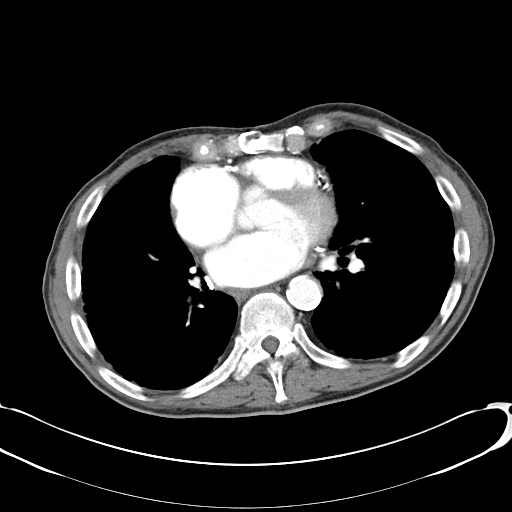
[im 90/135  bone]
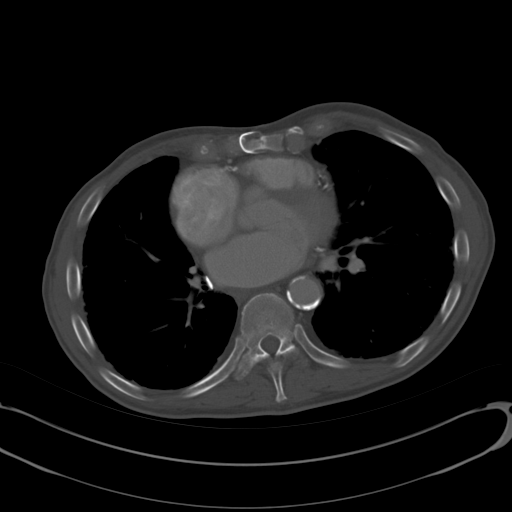
[im 99/135  soft-tissue]
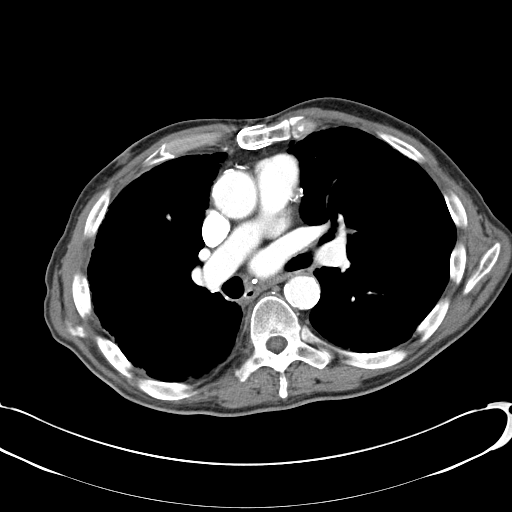
[im 108/135  soft-tissue]
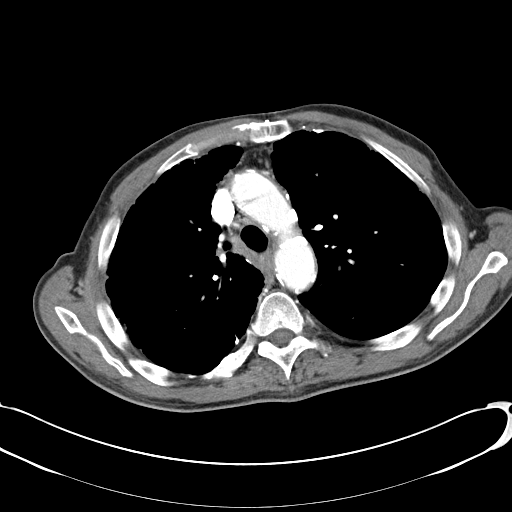
[im 117/135  soft-tissue]
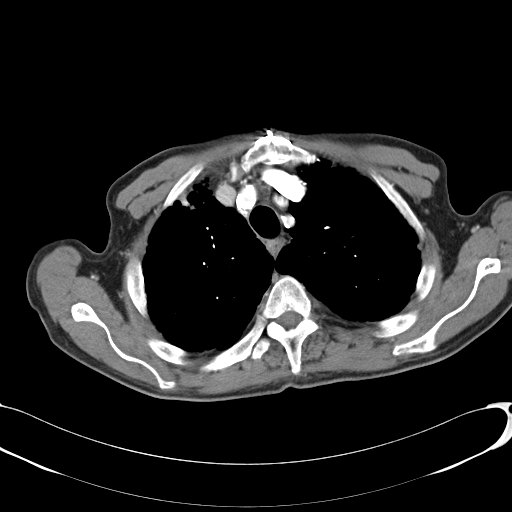
[im 126/135  soft-tissue]
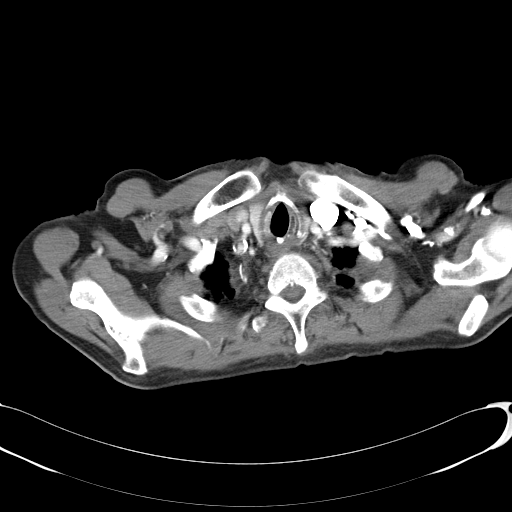

[Series 602: <mpr thick range> · coronal · 1.32mm/px · 3 of 80 slices shown]
[im 27/80  soft-tissue]
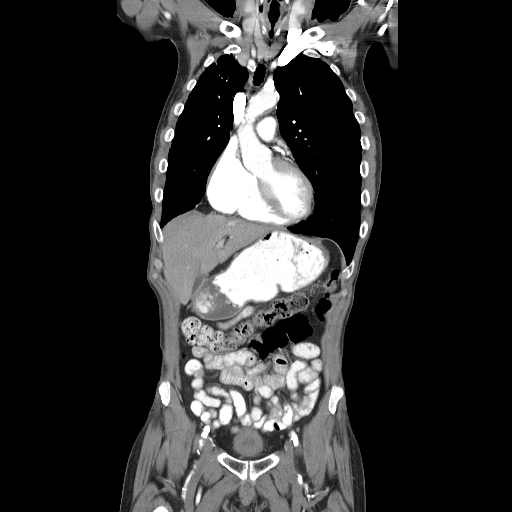
[im 36/80  soft-tissue]
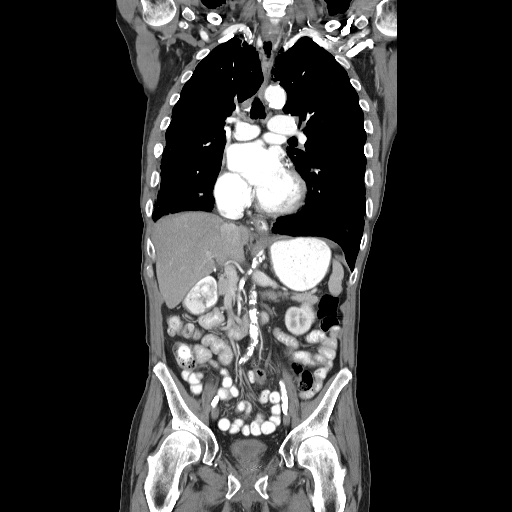
[im 44/80  soft-tissue]
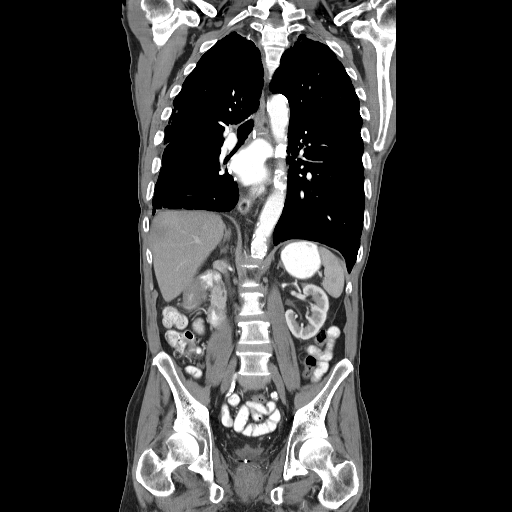

[16 of 46 positions shown; findings below may reference images not displayed]

FINDINGS: Precarinal node 1 cm in short axis, with borderline
prominent lymph nodes in this vicinity similar to prior.  Small
bilateral hilar lymph nodes are present there is evidence of
atherosclerosis and prior CABG.  Mild right heart enlargement is
observed with flattening of the interventricular septum.  Aortic
atherosclerosis is present.

Emphysema noted with biapical pleuroparenchymal scarring and patchy
peripheral regions of fibrosis and mild honeycombing particularly
in the right lung.  This appears generally stable although there
are some slightly increased nodular components along the
interstitial accentuation and fibrosis in the right lung, for
example on image 31 of series 6 laterally and on images 40-41 of
series 6 which will merit continued observation.  Small subpleural
lymph nodes are present along the left major fissure.
IMPRESSION: 1.  Emphysema with slightly progressive peripheral fibrosis and
honeycombing.  In several areas this has a slight nodular component
which will merit observation.
2.  Atherosclerosis.
3.  Right heart enlargement with flattening of the interventricular
septum.
4.  Borderline lower paratracheal adenopathy, similar to prior.

CT ABDOMEN AND PELVIS
FINDINGS: Stable elevated right hemidiaphragm. The liver, spleen,
pancreas, and adrenal glands appear unremarkable.

Right kidney upper pole cyst noted with stable mild bilateral renal
scarring.  Aortoiliac atherosclerotic calcification is present.

No pathologic retroperitoneal or porta hepatis adenopathy is
identified.

No pathologic pelvic adenopathy is identified.

Small caliber thick-walled urinary bladder noted.  Radiotherapy
seeds noted along the prostate bed.  Wall thickening noted in the
rectum.

No pathologic pelvic adenopathy is identified.
IMPRESSION: 1.  Wall thickening in the small caliber urinary bladder and in the
rectum, possibly related to prior radiation therapy.
2.  Atherosclerosis.
3.  No findings of metastatic disease in the abdomen or pelvis.

## 2014-03-02 ENCOUNTER — Emergency Department (HOSPITAL_COMMUNITY): Payer: Medicare Other

## 2014-03-02 ENCOUNTER — Encounter (HOSPITAL_COMMUNITY): Payer: Self-pay | Admitting: Anesthesiology

## 2014-03-02 ENCOUNTER — Ambulatory Visit (INDEPENDENT_AMBULATORY_CARE_PROVIDER_SITE_OTHER): Payer: Medicare Other | Admitting: Pharmacist

## 2014-03-02 ENCOUNTER — Encounter (HOSPITAL_COMMUNITY): Payer: Self-pay | Admitting: Emergency Medicine

## 2014-03-02 DIAGNOSIS — E875 Hyperkalemia: Secondary | ICD-10-CM | POA: Diagnosis present

## 2014-03-02 DIAGNOSIS — M069 Rheumatoid arthritis, unspecified: Secondary | ICD-10-CM | POA: Diagnosis present

## 2014-03-02 DIAGNOSIS — C3490 Malignant neoplasm of unspecified part of unspecified bronchus or lung: Secondary | ICD-10-CM | POA: Diagnosis present

## 2014-03-02 DIAGNOSIS — J9600 Acute respiratory failure, unspecified whether with hypoxia or hypercapnia: Secondary | ICD-10-CM

## 2014-03-02 DIAGNOSIS — L98499 Non-pressure chronic ulcer of skin of other sites with unspecified severity: Secondary | ICD-10-CM

## 2014-03-02 DIAGNOSIS — E785 Hyperlipidemia, unspecified: Secondary | ICD-10-CM | POA: Diagnosis present

## 2014-03-02 DIAGNOSIS — I219 Acute myocardial infarction, unspecified: Secondary | ICD-10-CM | POA: Diagnosis present

## 2014-03-02 DIAGNOSIS — Z87891 Personal history of nicotine dependence: Secondary | ICD-10-CM

## 2014-03-02 DIAGNOSIS — J9601 Acute respiratory failure with hypoxia: Secondary | ICD-10-CM | POA: Diagnosis present

## 2014-03-02 DIAGNOSIS — I959 Hypotension, unspecified: Secondary | ICD-10-CM | POA: Diagnosis not present

## 2014-03-02 DIAGNOSIS — IMO0002 Reserved for concepts with insufficient information to code with codable children: Secondary | ICD-10-CM | POA: Diagnosis not present

## 2014-03-02 DIAGNOSIS — Z8673 Personal history of transient ischemic attack (TIA), and cerebral infarction without residual deficits: Secondary | ICD-10-CM

## 2014-03-02 DIAGNOSIS — Z7982 Long term (current) use of aspirin: Secondary | ICD-10-CM

## 2014-03-02 DIAGNOSIS — I6529 Occlusion and stenosis of unspecified carotid artery: Secondary | ICD-10-CM | POA: Diagnosis present

## 2014-03-02 DIAGNOSIS — J189 Pneumonia, unspecified organism: Secondary | ICD-10-CM | POA: Diagnosis present

## 2014-03-02 DIAGNOSIS — Z8679 Personal history of other diseases of the circulatory system: Secondary | ICD-10-CM

## 2014-03-02 DIAGNOSIS — I739 Peripheral vascular disease, unspecified: Secondary | ICD-10-CM | POA: Diagnosis present

## 2014-03-02 DIAGNOSIS — Z5181 Encounter for therapeutic drug level monitoring: Secondary | ICD-10-CM

## 2014-03-02 DIAGNOSIS — N183 Chronic kidney disease, stage 3 unspecified: Secondary | ICD-10-CM | POA: Diagnosis present

## 2014-03-02 DIAGNOSIS — I252 Old myocardial infarction: Secondary | ICD-10-CM | POA: Diagnosis not present

## 2014-03-02 DIAGNOSIS — Z951 Presence of aortocoronary bypass graft: Secondary | ICD-10-CM | POA: Diagnosis not present

## 2014-03-02 DIAGNOSIS — Z85118 Personal history of other malignant neoplasm of bronchus and lung: Secondary | ICD-10-CM

## 2014-03-02 DIAGNOSIS — R5381 Other malaise: Secondary | ICD-10-CM | POA: Diagnosis present

## 2014-03-02 DIAGNOSIS — I4891 Unspecified atrial fibrillation: Secondary | ICD-10-CM | POA: Diagnosis present

## 2014-03-02 DIAGNOSIS — I2510 Atherosclerotic heart disease of native coronary artery without angina pectoris: Secondary | ICD-10-CM | POA: Diagnosis present

## 2014-03-02 DIAGNOSIS — I509 Heart failure, unspecified: Secondary | ICD-10-CM | POA: Diagnosis present

## 2014-03-02 DIAGNOSIS — I5023 Acute on chronic systolic (congestive) heart failure: Secondary | ICD-10-CM | POA: Diagnosis present

## 2014-03-02 DIAGNOSIS — I70209 Unspecified atherosclerosis of native arteries of extremities, unspecified extremity: Secondary | ICD-10-CM | POA: Diagnosis present

## 2014-03-02 DIAGNOSIS — Z79899 Other long term (current) drug therapy: Secondary | ICD-10-CM

## 2014-03-02 DIAGNOSIS — R5383 Other fatigue: Secondary | ICD-10-CM

## 2014-03-02 DIAGNOSIS — Z7901 Long term (current) use of anticoagulants: Secondary | ICD-10-CM | POA: Diagnosis not present

## 2014-03-02 DIAGNOSIS — R0602 Shortness of breath: Secondary | ICD-10-CM | POA: Diagnosis present

## 2014-03-02 DIAGNOSIS — I469 Cardiac arrest, cause unspecified: Secondary | ICD-10-CM | POA: Diagnosis present

## 2014-03-02 DIAGNOSIS — I249 Acute ischemic heart disease, unspecified: Secondary | ICD-10-CM

## 2014-03-02 LAB — URINALYSIS, ROUTINE W REFLEX MICROSCOPIC
Glucose, UA: NEGATIVE mg/dL
Hgb urine dipstick: NEGATIVE
Ketones, ur: 15 mg/dL — AB
Nitrite: POSITIVE — AB
Protein, ur: >300 mg/dL — AB
Specific Gravity, Urine: 1.026 (ref 1.005–1.030)
Urobilinogen, UA: 2 mg/dL — ABNORMAL HIGH (ref 0.0–1.0)
pH: 5 (ref 5.0–8.0)

## 2014-03-02 LAB — CBC WITH DIFFERENTIAL/PLATELET
Band Neutrophils: 0 % (ref 0–10)
Basophils Absolute: 0.2 10*3/uL — ABNORMAL HIGH (ref 0.0–0.1)
Basophils Relative: 3 % — ABNORMAL HIGH (ref 0–1)
Blasts: 0 %
Eosinophils Absolute: 0.1 10*3/uL (ref 0.0–0.7)
Eosinophils Relative: 2 % (ref 0–5)
HCT: 36.4 % — ABNORMAL LOW (ref 39.0–52.0)
Hemoglobin: 11.6 g/dL — ABNORMAL LOW (ref 13.0–17.0)
Lymphocytes Relative: 9 % — ABNORMAL LOW (ref 12–46)
Lymphs Abs: 0.6 10*3/uL — ABNORMAL LOW (ref 0.7–4.0)
MCH: 29.1 pg (ref 26.0–34.0)
MCHC: 31.9 g/dL (ref 30.0–36.0)
MCV: 91.2 fL (ref 78.0–100.0)
Metamyelocytes Relative: 0 %
Monocytes Absolute: 0.2 10*3/uL (ref 0.1–1.0)
Monocytes Relative: 3 % (ref 3–12)
Myelocytes: 0 %
Neutro Abs: 5.9 10*3/uL (ref 1.7–7.7)
Neutrophils Relative %: 83 % — ABNORMAL HIGH (ref 43–77)
Platelets: ADEQUATE 10*3/uL (ref 150–400)
Promyelocytes Absolute: 0 %
RBC: 3.99 MIL/uL — ABNORMAL LOW (ref 4.22–5.81)
RDW: 17.6 % — ABNORMAL HIGH (ref 11.5–15.5)
Smear Review: ADEQUATE
WBC: 7 10*3/uL (ref 4.0–10.5)
nRBC: 0 /100 WBC

## 2014-03-02 LAB — I-STAT TROPONIN, ED: Troponin i, poc: 0.04 ng/mL (ref 0.00–0.08)

## 2014-03-02 LAB — URINE MICROSCOPIC-ADD ON

## 2014-03-02 LAB — BASIC METABOLIC PANEL
Anion gap: 20 — ABNORMAL HIGH (ref 5–15)
BUN: 25 mg/dL — ABNORMAL HIGH (ref 6–23)
CO2: 21 mEq/L (ref 19–32)
Calcium: 9.6 mg/dL (ref 8.4–10.5)
Chloride: 103 mEq/L (ref 96–112)
Creatinine, Ser: 1.55 mg/dL — ABNORMAL HIGH (ref 0.50–1.35)
GFR calc Af Amer: 47 mL/min — ABNORMAL LOW (ref >90)
GFR calc non Af Amer: 41 mL/min — ABNORMAL LOW (ref >90)
Glucose, Bld: 120 mg/dL — ABNORMAL HIGH (ref 70–99)
Potassium: 5.7 mEq/L — ABNORMAL HIGH (ref 3.7–5.3)
Sodium: 144 mEq/L (ref 137–147)

## 2014-03-02 LAB — GLUCOSE, CAPILLARY: GLUCOSE-CAPILLARY: 120 mg/dL — AB (ref 70–99)

## 2014-03-02 LAB — PRO B NATRIURETIC PEPTIDE: Pro B Natriuretic peptide (BNP): 23980 pg/mL — ABNORMAL HIGH (ref 0–450)

## 2014-03-02 LAB — POCT INR: INR: 5.1

## 2014-03-02 LAB — CBG MONITORING, ED: Glucose-Capillary: 142 mg/dL — ABNORMAL HIGH (ref 70–99)

## 2014-03-02 MED ORDER — EPINEPHRINE HCL 0.1 MG/ML IJ SOSY
PREFILLED_SYRINGE | INTRAMUSCULAR | Status: AC
  Filled 2014-03-02: qty 40

## 2014-03-02 MED ORDER — DILTIAZEM HCL 25 MG/5ML IV SOLN
10.0000 mg | Freq: Once | INTRAVENOUS | Status: AC
  Administered 2014-03-02: 10 mg via INTRAVENOUS
  Filled 2014-03-02: qty 5

## 2014-03-02 MED ORDER — DEXTROSE 5 % IV SOLN
1.0000 g | Freq: Once | INTRAVENOUS | Status: AC
  Administered 2014-03-02: 1 g via INTRAVENOUS
  Filled 2014-03-02: qty 10

## 2014-03-02 MED ORDER — FUROSEMIDE 10 MG/ML IJ SOLN
20.0000 mg | Freq: Once | INTRAMUSCULAR | Status: AC
  Administered 2014-03-02: 20 mg via INTRAVENOUS
  Filled 2014-03-02: qty 2

## 2014-03-02 MED ORDER — DEXTROSE 5 % IV SOLN
500.0000 mg | Freq: Once | INTRAVENOUS | Status: AC
  Administered 2014-03-02: 500 mg via INTRAVENOUS
  Filled 2014-03-02: qty 500

## 2014-03-02 NOTE — Progress Notes (Signed)
CODE BLUE NOTE  Patient Name: [name]   MRN: [id number]   Date of Birth/ Sex: [birth year] , male      Admission Date: 02/08/2014  Attending Provider: Raylene Miyamoto, MD  Primary Diagnosis: CAP (community acquired pneumonia) [486] Acute respiratory failure with hypoxia [518.81] Atrial fibrillation, unspecified [427.31] Acute on chronic heart failure, unspecified heart failure type [428.0]    Indication: Pt had been resuscitated and was transferred to the cardiac ICU when his pulse was lost.  ACLS was reinitiated.  Technical Description:  - CPR performance duration:  24  minutes  - Was defibrillation or cardioversion used? Yes   - Was external pacer placed? No  - Was patient intubated pre/post CPR? Yes    Medications Administered: Y = Yes; Blank = No Amiodarone  Y  Atropine    Calcium  Y  Epinephrine  Y  Lidocaine    Magnesium    Norepinephrine  Y  Phenylephrine    Sodium bicarbonate  Y  Vasopressin    Other Y  Given insulin and D50.  Post CPR evaluation:  - Final Status - Was patient successfully resuscitated ? No   Miscellaneous Information:  - Time of death:  08/20/48  PM  - Primary team notified?  Yes  - Family Notified? Yes        Arman Filter, MD   02/18/2014, 11:54 PM

## 2014-03-02 NOTE — ED Notes (Signed)
Admitting physician at bedside

## 2014-03-02 NOTE — ED Provider Notes (Signed)
CSN: [id number]     Arrival date & time 03/06/2014  1543 History   First MD Initiated Contact with Patient 02/14/2014 1647     Chief Complaint  Patient presents with  . Shortness of Breath     (Consider location/radiation/quality/duration/timing/severity/associated sxs/prior Treatment) HPI  80 year old male with approximately one-month history of dyspnea and exercise intolerance. Progressively worsening. Significantly worse over the past few days. He feels tired with very minimal exertion. Anorexia. He denies any acute pain. No fevers or chills. No cough. Some LE swelling, but no acute change. Reports compliance with his medication. He is on Coumadin. Will occasionally notice a small amount of BRB in his stool.  Last noticed yesterday.  Past Medical History  Diagnosis Date  . CARCINOMA, LUNG, SQUAMOUS CELL 08/21/2008  . HYPERLIPIDEMIA 11/13/2006  . ANEMIA 09/07/2009  . MYOCARDIAL INFARCTION, HX OF 11/13/2006  . CORONARY ARTERY DISEASE 11/13/2006  . Atrial fibrillation 11/13/2006  . CONGESTIVE HEART FAILURE 02/01/2007  . CAROTID ARTERY STENOSIS, WITHOUT INFARCTION 09/07/2008  . KIDNEY DISEASE, CHRONIC, STAGE II 10/13/2007  . Rheumatoid arthritis(714.0) 11/13/2006  . OSTEOPENIA 11/13/2006  . Cancer of prostate 10/24/2009  . TRANSIENT ISCHEMIC ATTACK, HX OF 11/13/2006  . COLONIC POLYPS, HX OF 07/04/2003    6 mm adenoma  . Femoral artery occlusion     right  . PAD (peripheral artery disease)   . Stroke 2008  . Shortness of breath     with exertion   Past Surgical History  Procedure Laterality Date  . Angioplasty  1995  . Embolectomy    . Femoral artery - popliteal artery bypass graft  06/21/08    rt below-knee  . Coronary artery bypass graft  01/31/02  . Cardiac electrophysiology study and ablation      radiofrequency catheter of atrial flutter  . Lobectomy  2009    right lower  . Bilateral vats ablation  2009    right minithoracotomy  . Colonoscopy w/ biopsies and polypectomy  06/2003, 09/2009   tubular adenoma, diverticulosis,   . Esophagogastroduodenoscopy  09/2009    2 gastric antral ulcers, antral gastritis  . Eye surgery Bilateral     cataracts  . Endarterectomy femoral Left 08/23/2013    Procedure: LEFT COMMON FEMORAL ARTERY ENDARTERECTOMY ;  Surgeon: Angelia Mould, MD;  Location: [facility];  Service: Vascular;  Laterality: Left;  . Patch angioplasty Left 08/23/2013    Procedure: PATCH ANGIOPLASTY USING LEFT LEG SAPHENOUS VEIN;  Surgeon: Angelia Mould, MD;  Location: [facility] OR;  Service: Vascular;  Laterality: Left;   Family History  Problem Relation Age of Onset  . Tuberculosis Mother   . Cancer Sister     Breast    History  Substance Use Topics  . Smoking status: Former Smoker -- 1.00 packs/day for 30 years    Quit date: 06/09/2001  . Smokeless tobacco: Never Used  . Alcohol Use: No    Review of Systems  All systems reviewed and negative, other than as noted in HPI.   Allergies  Review of patient's allergies indicates no known allergies.  Home Medications   Prior to Admission medications   Medication Sig Start Date End Date Taking? Authorizing Provider  acetaminophen (TYLENOL) 325 MG tablet Take 650 mg by mouth every 6 (six) hours as needed. For pain or fever    Historical Provider, MD  alendronate (FOSAMAX) 70 MG tablet Take 70 mg by mouth every Sunday. Take with a full glass of water on an empty stomach.  Historical Provider, MD  aspirin EC 81 MG tablet Take 81 mg by mouth daily.    Historical Provider, MD  [facility] IRON 325 (65 FE) MG tablet TAKE 1 TABLET BY MOUTH DAILY. 07/16/13   Lisabeth Pick, MD  diltiazem (CARDIZEM CD) 120 MG 24 hr capsule Take 120 mg by mouth daily.    Historical Provider, MD  enoxaparin (LOVENOX) 80 MG/0.8ML injection Inject 0.8 mLs (80 mg total) into the skin daily. 08/16/13   Sherren Mocha, MD  finasteride (PROSCAR) 5 MG tablet Take 5 mg by mouth daily.    Historical Provider, MD  fluticasone (FLONASE) 50 MCG/ACT nasal spray  Place 2 sprays into the nose daily as needed. For allergies 09/13/12 09/15/14  Lisabeth Pick, MD  folic acid (FOLVITE) 568 MCG tablet Take 400 mcg by mouth daily.     Historical Provider, MD  furosemide (LASIX) 20 MG tablet TAKE 1 TABLET BY MOUTH EVERY DAY 08/26/13   Lisabeth Pick, MD  leflunomide (ARAVA) 20 MG tablet Take 20 mg by mouth daily.  02/25/12   Historical Provider, MD  lisinopril (PRINIVIL,ZESTRIL) 5 MG tablet Take 1 tablet (5 mg total) by mouth daily. 07/20/13   Liliane Shi, PA-C  metoprolol succinate (TOPROL-XL) 100 MG 24 hr tablet TAKE 1 TABLET BY MOUTH EVERY DAY    Darrick Penna Swords, MD  predniSONE (DELTASONE) 5 MG tablet Take 5 mg by mouth daily.  07/27/12   Historical Provider, MD  simvastatin (ZOCOR) 40 MG tablet TAKE 1 TABLET BY MOUTH AT BEDTIME    Bruce Kendall Flack, MD  sulfamethoxazole-trimethoprim (BACTRIM DS) 800-160 MG per tablet Take 1 tablet by mouth 2 (two) times daily. 11/17/13   Historical Provider, MD  warfarin (COUMADIN) 5 MG tablet Take 5 mg by mouth daily. 5mg  on Monday, Wednesday and Friday.  All other days take 2.5mg     Historical Provider, MD  warfarin (COUMADIN) 5 MG tablet TAKE 1 TABLET BY MOUTH AS DIRECTED BY COUMADIN CLINIC 01/23/14   Sherren Mocha, MD   BP 107/75  Pulse 115  Resp 20  SpO2 93% Physical Exam  Nursing note and vitals reviewed. Constitutional: He is oriented to person, place, and time. He appears well-developed and well-nourished. No distress.  HENT:  Head: Normocephalic and atraumatic.  JVD  Eyes: Conjunctivae are normal. Right eye exhibits no discharge. Left eye exhibits no discharge.  Neck: Neck supple.  Cardiovascular: Regular rhythm and normal heart sounds.  Exam reveals no gallop and no friction rub.   No murmur heard. Tachycardic. irreg irreg.   Pulmonary/Chest: Effort normal. No respiratory distress.  Decreased breath sounds b/l bases  Abdominal: Soft. He exhibits no distension. There is no tenderness.  Musculoskeletal: He exhibits no  edema and no tenderness.  Neurological: He is alert and oriented to person, place, and time. He exhibits normal muscle tone. Coordination normal.  Skin: Skin is warm and dry.  Psychiatric: He has a normal mood and affect. His behavior is normal. Thought content normal.    ED Course  Procedures (including critical care time) Labs Review Labs Reviewed  BASIC METABOLIC PANEL - Abnormal; Notable for the following:    Potassium 5.7 (*)    Glucose, Bld 120 (*)    BUN 25 (*)    Creatinine, Ser 1.55 (*)    GFR calc non Af Amer 41 (*)    GFR calc Af Amer 47 (*)    Anion gap 20 (*)    All other components within normal limits  PRO B NATRIURETIC PEPTIDE - Abnormal; Notable for the following:    Pro B Natriuretic peptide (BNP) 23980.0 (*)    All other components within normal limits  CBC WITH DIFFERENTIAL - Abnormal; Notable for the following:    RBC 3.99 (*)    Hemoglobin 11.6 (*)    HCT 36.4 (*)    RDW 17.6 (*)    Neutrophils Relative % 83 (*)    Lymphocytes Relative 9 (*)    Basophils Relative 3 (*)    Lymphs Abs 0.6 (*)    Basophils Absolute 0.2 (*)    All other components within normal limits  URINALYSIS, ROUTINE W REFLEX MICROSCOPIC  PROTIME-INR  I-STAT TROPOININ, ED    Imaging Review No results found.   EKG Interpretation None      MDM   Final diagnoses:  Acute on chronic heart failure, unspecified heart failure type  CAP (community acquired pneumonia)  Atrial fibrillation, unspecified    82yM with dyspnea/exercise intolerance. In afib. Hx of same. Mild RVR. Improved with small dose of cardizem. Reports mild BRBPR. Ongoing issue though and describes what sounds like minimal amounts. H/H at baseline to slightly improved. CXR with chronic pleural effusion. BNP elevated. JVD. On lasix. Additional dose given in ED. Should help with mild hyperK as well. Questionable L sided pneumonia. Clinically not convincing, but with hypoxemia concerning enough to tx for possible CAP.  Consider PE, but less likely on coumadin. INR still pending. CT angio deferred at this time. Consider anginal equivalent. Initial trop is normal. EKG with nonspecific changes. Denies any pain. Admit for further tx.    Virgel Manifold, MD 02/11/[id number]

## 2014-03-02 NOTE — Progress Notes (Signed)
[facility] Progress Note Patient Name: [name] DOB: 1931-06-24 MRN: [id number]   Date of Service  03/01/2014  HPI/Events of Note  Supported code from [place].  Treated empiric mild hyper K  MI?  eICU Interventions  Cards arrived appreciated Likley to not survive     Intervention Category Major Interventions: Code management / supervision  Raylene Miyamoto. 02/12/2014, 10:48 PM

## 2014-03-02 NOTE — ED Notes (Signed)
IV start attempt by 2 RNs.  IV team paged.

## 2014-03-02 NOTE — Consult Note (Signed)
Admission note reviewed. Events of today reviewed. By the time of my arrival, pt was in midst of second cardiac arrest and had undergone at least 30 minutes ACLS without ROSC. Dr Radford Pax was present @ bedside. Dr Titus Mould had been overseeing resuscitative efforts from [place]. We all concurred that further CPR/ACLS would be medically ineffective. Resuscitative efforts were terminated. Pt was pulseless without spontaneous respirations. He was pronounced dead @ that time.  I have updated pt's son regarding that events of this evening  The cause of death appears to be recurrent cardiac arrest due to acute coronary syndrome   There will be no charge for this encounter   Merton Border, MD ; [facility] service [place] [phone].  After 5:30 PM or weekends, call [phone]

## 2014-03-02 NOTE — Discharge Summary (Addendum)
Expiration Note  [name]  MR#: [id number]  DOB:[birth year]  Date of Admission: 2014-03-28 Date of Death: Mar 28, 2014  Attending Physician:Eben Choinski A  Patient's PCP: Chancy Hurter, MD  Consults:   Dr. Titus Mould, Critical Care   Cardiology  Cause of Death: Myocardial infarction  Secondary Diagnoses Present on Admission:  . CAP (community acquired pneumonia) . Acute respiratory failure with hypoxia . Atherosclerotic PVD with ulceration . Atrial fibrillation . CAROTID ARTERY STENOSIS, WITHOUT INFARCTION . CONGESTIVE HEART FAILURE . CORONARY ARTERY DISEASE . HYPERLIPIDEMIA . CKD (chronic kidney disease), stage III . CARCINOMA, LUNG, SQUAMOUS CELL, S/P RESECTION . Rheumatoid arthritis(714.0) . Shortness of breath Myocardial Infarction  Hospital Course: [name] is a 80 y.o. African American male with history of anemia, hyperlipidemia, squamous cell carcinoma of the lung, coronary artery disease, atrial fibrillation on chronic anticoagulation, chronic systolic congestive heart failure with EF of 40-45% based on a 2-D echocardiogram on 04/11/2013, chronic kidney disease stage III, rheumatoid arthritis on chronic steroids, peripheral artery disease, and CVA who initially presented to the emergency department on 03/28/14 with complaints of shortness of breath.  After evaluation in the emergency department, patient was admitted to a telemetry bed.  However shortly after arriving on the unit, at 852 p.m. patient became unresponsive on drop response was called.  Given his changes in his mental status and hypoxia with worsening respiratory status, Dr. Titus Mould with critical care service was contacted to be arranged for transfer to the ICU.  Patient prior to the transfer had no pulses and was hypotensive as a result chest compressions were started for 2 minutes.  He then had pulses but was still hypotensive.  He was started on Levophed and intubated by the critical care  service.  On EKG patient had STEMI and Dr. Burt Knack with cardiology was contacted.  After being transferred to the critical care service, patient coded 3 more times.  After multiple attempts by the critical care service, patient was pronounced dead at 1046 p.m. on 2014/03/28.  Wife updated by the critical care service and condolences were offered.  Labs/Results: Results for orders placed during the hospital encounter of 2014/03/28 (from the past 48 hour(s))  PRO B NATRIURETIC PEPTIDE     Status: Abnormal   Collection Time    2014/03/28  4:21 PM      Result Value Ref Range   Pro B Natriuretic peptide (BNP) 23980.0 (*) 0 - 450 pg/mL  CBC WITH DIFFERENTIAL     Status: Abnormal   Collection Time    28-Mar-2014  4:21 PM      Result Value Ref Range   WBC 7.0  4.0 - 10.5 K/uL   Comment: WHITE COUNT CONFIRMED ON SMEAR   RBC 3.99 (*) 4.22 - 5.81 MIL/uL   Hemoglobin 11.6 (*) 13.0 - 17.0 g/dL   HCT 36.4 (*) 39.0 - 52.0 %   MCV 91.2  78.0 - 100.0 fL   MCH 29.1  26.0 - 34.0 pg   MCHC 31.9  30.0 - 36.0 g/dL   RDW 17.6 (*) 11.5 - 15.5 %   Platelets PLATELETS APPEAR ADEQUATE  150 - 400 K/uL   Neutrophils Relative % 83 (*) 43 - 77 %   Lymphocytes Relative 9 (*) 12 - 46 %   Monocytes Relative 3  3 - 12 %   Eosinophils Relative 2  0 - 5 %   Basophils Relative 3 (*) 0 - 1 %   Band Neutrophils 0  0 - 10 %  Metamyelocytes Relative 0     Myelocytes 0     Promyelocytes Absolute 0     Blasts 0     nRBC 0  0 /100 WBC   Neutro Abs 5.9  1.7 - 7.7 K/uL   Lymphs Abs 0.6 (*) 0.7 - 4.0 K/uL   Monocytes Absolute 0.2  0.1 - 1.0 K/uL   Eosinophils Absolute 0.1  0.0 - 0.7 K/uL   Basophils Absolute 0.2 (*) 0.0 - 0.1 K/uL   RBC Morphology POLYCHROMASIA PRESENT     Comment: ACANTHOCYTES     SCHISTOCYTES PRESENT (2-5/hpf)   Smear Review       Value: PLATELET CLUMPS NOTED ON SMEAR, COUNT APPEARS ADEQUATE   Comment: LARGE PLATELETS PRESENT  BASIC METABOLIC PANEL     Status: Abnormal   Collection Time    03/01/2014  6:01  PM      Result Value Ref Range   Sodium 144  137 - 147 mEq/L   Potassium 5.7 (*) 3.7 - 5.3 mEq/L   Chloride 103  96 - 112 mEq/L   CO2 21  19 - 32 mEq/L   Glucose, Bld 120 (*) 70 - 99 mg/dL   BUN 25 (*) 6 - 23 mg/dL   Creatinine, Ser 1.55 (*) 0.50 - 1.35 mg/dL   Calcium 9.6  8.4 - 10.5 mg/dL   GFR calc non Af Amer 41 (*) >90 mL/min   GFR calc Af Amer 47 (*) >90 mL/min   Comment: (NOTE)     The eGFR has been calculated using the CKD EPI equation.     This calculation has not been validated in all clinical situations.     eGFR's persistently <90 mL/min signify possible Chronic Kidney     Disease.   Anion gap 20 (*) 5 - 15  I-STAT TROPOININ, ED     Status: None   Collection Time    02/20/2014  6:29 PM      Result Value Ref Range   Troponin i, poc 0.04  0.00 - 0.08 ng/mL   Comment 3            Comment: Due to the release kinetics of cTnI,     a negative result within the first hours     of the onset of symptoms does not rule out     myocardial infarction with certainty.     If myocardial infarction is still suspected,     repeat the test at appropriate intervals.  URINALYSIS, ROUTINE W REFLEX MICROSCOPIC     Status: Abnormal   Collection Time    03/08/2014  8:05 PM      Result Value Ref Range   Color, Urine AMBER (*) YELLOW   Comment: BIOCHEMICALS MAY BE AFFECTED BY COLOR   APPearance TURBID (*) CLEAR   Specific Gravity, Urine 1.026  1.005 - 1.030   pH 5.0  5.0 - 8.0   Glucose, UA NEGATIVE  NEGATIVE mg/dL   Hgb urine dipstick NEGATIVE  NEGATIVE   Bilirubin Urine LARGE (*) NEGATIVE   Ketones, ur 15 (*) NEGATIVE mg/dL   Protein, ur >300 (*) NEGATIVE mg/dL   Urobilinogen, UA 2.0 (*) 0.0 - 1.0 mg/dL   Nitrite POSITIVE (*) NEGATIVE   Leukocytes, UA SMALL (*) NEGATIVE  URINE MICROSCOPIC-ADD ON     Status: Abnormal   Collection Time    02/26/2014  8:05 PM      Result Value Ref Range   Squamous Epithelial / LPF FEW (*) RARE  WBC, UA 3-6  <3 WBC/hpf   RBC / HPF 0-2  <3 RBC/hpf    Bacteria, UA FEW (*) RARE   Casts GRANULAR CAST (*) NEGATIVE   Comment: HYALINE CASTS     WAXY CAST   Urine-Other AMORPHOUS URATES/PHOSPHATES    CBG MONITORING, ED     Status: Abnormal   Collection Time    03/05/2014  9:41 PM      Result Value Ref Range   Glucose-Capillary 142 (*) 70 - 99 mg/dL    Dg Chest 2 View  02/07/2014   CLINICAL DATA:  Shortness of breath and cough  EXAM: CHEST  2 VIEW  COMPARISON:  December 13, 2013  FINDINGS: The heart size and mediastinal contours are stable. The patient is status post prior CABG and median sternotomy. The lungs are hyperinflated. There is chronic pleural thickening versus pleural effusion of right lung base. There is probable small left pleural effusion. There is mild increased pulmonary interstitium bilaterally probably due to chronic scar unchanged. There is mild hazy pad opacity of the medial left lung base. The visualized skeletal structures are stable.  IMPRESSION: Mild hazy opacity of left lung base, developing early pneumonia is not excluded. Probable small left pleural effusion. Right pleural thickening versus pleural effusion unchanged compared to prior exam.   Electronically Signed   By: Abelardo Diesel M.D.   On: 02/28/2014 18:58    Signed: Bynum Bellows, MD 02/11/2014, 10:55 PM

## 2014-03-02 NOTE — ED Notes (Signed)
Patient currently in xray ?

## 2014-03-02 NOTE — H&P (Addendum)
Patient's PCP: Chancy Hurter, MD  Chief Complaint: Shortness of breath  History of Present Illness: [name] is a 80 y.o. African American male with history of anemia, hyperlipidemia, squamous cell carcinoma of the lung, coronary artery disease, atrial fibrillation on chronic anticoagulation, chronic systolic congestive heart failure with EF of 40-45% based on a 2-D echocardiogram on 04/11/2013, chronic kidney disease stage III, rheumatoid arthritis on chronic steroids, peripheral artery disease, and CVA who presents with the above complaints.  Patient reports that he has been progressively getting short of breath over the last month.  He is noted that he has been getting short of breath with activity.  He has been also having cough productive for phlegm.  He has had some swelling in his legs, which per patient is unchanged.  He reports having an elevated temperature early this month once, but did not measure the temperature.  He indicates that resting improves his shortness of breath.  Today he went to [facility] and noted that he was severely short of breath when he got out of elevators.  As a result he presented to the emergency department for further evaluation.  Chest x-ray shows mild hazy opacity of left lung base concerning for possible early pneumonia and right pleural thickening versus pleural effusion is unchanged.  Hospitalist service was asked to admit the patient for further care and management.  Currently denies any fevers, chills, nausea, vomiting, chest pain, abdominal pain, diarrhea, headaches or vision changes.  Review of Systems: All systems reviewed with the patient and positive as per history of present illness, otherwise all other systems are negative.  Past Medical History  Diagnosis Date  . CARCINOMA, LUNG, SQUAMOUS CELL 08/21/2008  . HYPERLIPIDEMIA 11/13/2006  . ANEMIA 09/07/2009  . MYOCARDIAL INFARCTION, HX OF 11/13/2006  . CORONARY ARTERY DISEASE 11/13/2006  . Atrial  fibrillation 11/13/2006  . CONGESTIVE HEART FAILURE 02/01/2007  . CAROTID ARTERY STENOSIS, WITHOUT INFARCTION 09/07/2008  . KIDNEY DISEASE, CHRONIC, STAGE II 10/13/2007  . Rheumatoid arthritis(714.0) 11/13/2006  . OSTEOPENIA 11/13/2006  . Cancer of prostate 10/24/2009  . TRANSIENT ISCHEMIC ATTACK, HX OF 11/13/2006  . COLONIC POLYPS, HX OF 07/04/2003    6 mm adenoma  . Femoral artery occlusion     right  . PAD (peripheral artery disease)   . Stroke 2008  . Shortness of breath     with exertion   Past Surgical History  Procedure Laterality Date  . Angioplasty  1995  . Embolectomy    . Femoral artery - popliteal artery bypass graft  06/21/08    rt below-knee  . Coronary artery bypass graft  01/31/02  . Cardiac electrophysiology study and ablation      radiofrequency catheter of atrial flutter  . Lobectomy  2009    right lower  . Bilateral vats ablation  2009    right minithoracotomy  . Colonoscopy w/ biopsies and polypectomy  06/2003, 09/2009    tubular adenoma, diverticulosis,   . Esophagogastroduodenoscopy  09/2009    2 gastric antral ulcers, antral gastritis  . Eye surgery Bilateral     cataracts  . Endarterectomy femoral Left 08/23/2013    Procedure: LEFT COMMON FEMORAL ARTERY ENDARTERECTOMY ;  Surgeon: Angelia Mould, MD;  Location: [place];  Service: Vascular;  Laterality: Left;  . Patch angioplasty Left 08/23/2013    Procedure: PATCH ANGIOPLASTY USING LEFT LEG SAPHENOUS VEIN;  Surgeon: Angelia Mould, MD;  Location: [place];  Service: Vascular;  Laterality: Left;   Family  History  Problem Relation Age of Onset  . Tuberculosis Mother   . Cancer Sister     Breast    History   Social History  . Marital Status: Married    Spouse Name: N/A    Number of Children: 4  . Years of Education: N/A   Occupational History  . former truck Geophysicist/field seismologist    Social History Main Topics  . Smoking status: Former Smoker -- 1.00 packs/day for 30 years    Quit date: 06/09/2001  . Smokeless  tobacco: Never Used  . Alcohol Use: No  . Drug Use: No  . Sexual Activity: Not on file   Other Topics Concern  . Not on file   Social History Narrative  . No narrative on file   Allergies: Review of patient's allergies indicates no known allergies.  Home Meds: Prior to Admission medications   Medication Sig Start Date End Date Taking? Authorizing Provider  acetaminophen (TYLENOL) 325 MG tablet Take 650 mg by mouth every 6 (six) hours as needed. For pain or fever   Yes Historical Provider, MD  alendronate (FOSAMAX) 70 MG tablet Take 70 mg by mouth every Sunday. Take with a full glass of water on an empty stomach.   Yes Historical Provider, MD  aspirin EC 81 MG tablet Take 81 mg by mouth daily.   Yes Historical Provider, MD  [facility] IRON 325 (65 FE) MG tablet TAKE 1 TABLET BY MOUTH DAILY. 07/16/13  Yes Lisabeth Pick, MD  diltiazem (CARDIZEM CD) 120 MG 24 hr capsule Take 120 mg by mouth daily.   Yes Historical Provider, MD  finasteride (PROSCAR) 5 MG tablet Take 5 mg by mouth daily.   Yes Historical Provider, MD  folic acid (FOLVITE) 160 MCG tablet Take 400 mcg by mouth daily.    Yes Historical Provider, MD  furosemide (LASIX) 20 MG tablet TAKE 1 TABLET BY MOUTH EVERY DAY 08/26/13  Yes Lisabeth Pick, MD  leflunomide (ARAVA) 20 MG tablet Take 20 mg by mouth daily.  02/25/12  Yes Historical Provider, MD  lisinopril (PRINIVIL,ZESTRIL) 5 MG tablet Take 1 tablet (5 mg total) by mouth daily. 07/20/13  Yes Scott Joylene Draft, PA-C  metoprolol succinate (TOPROL-XL) 100 MG 24 hr tablet TAKE 1 TABLET BY MOUTH EVERY DAY   Yes Bruce Lemmie Evens Swords, MD  predniSONE (DELTASONE) 5 MG tablet Take 5 mg by mouth daily.  07/27/12  Yes Historical Provider, MD  simvastatin (ZOCOR) 40 MG tablet TAKE 1 TABLET BY MOUTH AT BEDTIME   Yes Lisabeth Pick, MD  warfarin (COUMADIN) 5 MG tablet Take 2.5-5 mg by mouth daily. Sat - Thurs 2.5 mg, and Friday 5 mg   Yes Historical Provider, MD    Physical Exam: Blood pressure 129/71, pulse  105, temperature 0 F (-17.8 C), resp. rate 21, SpO2 100.00%. General: Awake, Oriented x3, No acute distress. HEENT: EOMI, Moist mucous membranes Neck: Supple, elevated JVD although the to the mandible. CV: S1 and S2 Lungs: Scattered crackles at bases bilaterally, no wheezing.  Good air movement bilaterally. Abdomen: Soft, Nontender, Nondistended, +bowel sounds. Ext: Good pulses.  1-2+ lower extremity edema bilaterally. No clubbing or cyanosis noted. Neuro: Cranial Nerves II-XII grossly intact. Has 5/5 motor strength in upper and lower extremities.  Lab results:  Recent Labs  02/20/2014 1801  NA 144  K 5.7*  CL 103  CO2 21  GLUCOSE 120*  BUN 25*  CREATININE 1.55*  CALCIUM 9.6   No results found for this  basename: AST, ALT, ALKPHOS, BILITOT, PROT, ALBUMIN,  in the last 72 hours No results found for this basename: LIPASE, AMYLASE,  in the last 72 hours  Recent Labs  02/28/2014 1621  WBC 7.0  NEUTROABS 5.9  HGB 11.6*  HCT 36.4*  MCV 91.2  PLT PLATELETS APPEAR ADEQUATE   No results found for this basename: CKTOTAL, CKMB, CKMBINDEX, TROPONINI,  in the last 72 hours No components found with this basename: POCBNP,  No results found for this basename: DDIMER,  in the last 72 hours No results found for this basename: HGBA1C,  in the last 72 hours No results found for this basename: CHOL, HDL, LDLCALC, TRIG, CHOLHDL, LDLDIRECT,  in the last 72 hours No results found for this basename: TSH, T4TOTAL, FREET3, T3FREE, THYROIDAB,  in the last 72 hours No results found for this basename: VITAMINB12, FOLATE, FERRITIN, TIBC, IRON, RETICCTPCT,  in the last 72 hours Imaging results:  Dg Chest 2 View  02/23/2014   CLINICAL DATA:  Shortness of breath and cough  EXAM: CHEST  2 VIEW  COMPARISON:  December 13, 2013  FINDINGS: The heart size and mediastinal contours are stable. The patient is status post prior CABG and median sternotomy. The lungs are hyperinflated. There is chronic pleural thickening  versus pleural effusion of right lung base. There is probable small left pleural effusion. There is mild increased pulmonary interstitium bilaterally probably due to chronic scar unchanged. There is mild hazy pad opacity of the medial left lung base. The visualized skeletal structures are stable.  IMPRESSION: Mild hazy opacity of left lung base, developing early pneumonia is not excluded. Probable small left pleural effusion. Right pleural thickening versus pleural effusion unchanged compared to prior exam.   Electronically Signed   By: Abelardo Diesel M.D.   On: 02/16/2014 18:58   Other results: EKG: Atrial fibrillation with RVR.  Assessment & Plan by Problem: Acute respiratory failure with hypoxia Continue oxygen.  Suspect is likely a combination of early pneumonia with acute on chronic systolic congestive heart failure.  Patient started on ceftriaxone and azithromycin which will be continued.  Patient given 20 mg of IV Lasix which will be continued twice daily for diuresis.  Community acquired pneumonia Started the patient on ceftriaxone and azithromycin.  Patient does not have leukocytosis or is febrile, though immunocompromised on chronic prednisone therapy.  Consider repeating chest x-ray in a few days after diuresis, if no signs of developing infiltrate may consider de-escalating antibiotics.  Acute on chronic systolic congestive heart failure Patient's pro BNP is 23,980 in the setting of chronic kidney disease stage III.  Continue diuresis on Lasix 20 mg IV twice daily.  Monitor the patient on telemetry, cycle cardiac enzymes.  If no significant improvement in his breathing, may consider cardiology evaluation.  Continue home aspirin.  Patient's echocardiogram from 04/11/2013 reviewed, request another echocardiogram to evaluate for any changes.  Atrial fibrillation with rapid ventricular rate Patient received 1 dose of IV diltiazem in the emergency department.  Heart rate better controlled.   Continue anticoagulation on Coumadin.  Continue home metoprolol and diltiazem.  Further titration of home medications depending on patient's heart rate.  Coronary artery disease Stable.  Continue home medications.  Peripheral vascular disease Stable.  Continue medications.  Rheumatoid arthritis Continue steroids.  Chronic kidney disease stage III Renal function at his baseline.  Continue to monitor given diuresis.  Hyperkalemia Suspect is likely due to hemolysis.  Patient given a dose of Lasix.  Recheck potassium and monitor  carefully.  Hyperlipidemia Continue statin.  Prophylaxis Continue anticoagulation on Coumadin.  INR pending.  CODE STATUS Full code.  Disposition Admit the patient to telemetry as inpatient.  Time spent on admission, talking to the patient, and coordinating care was: 50 mins.  Grayton Lobo A, MD 02/11/2014, 8:14 PM  Was called to the patient's bedside by rapid response as patient was unresponsive.  I evaluated the patient at bedside, he was initially unresponsive but had adequate pressure.  Blood glucose was normal.  Given changes in his mental status and hypoxia with worsening respiratory status contacted Dr. Titus Mould to have the patient transferred to ICU for further care and management.  When I went back in the room, critical care was at bedside and was performing chest compressions as patient was hypotensive and pulseless.  Patient did not require defibrillation.  Patient had EKG changes concerning for ST elevation MI.  After 2 minutes of chest compressions, patient had pulses and was hypotensive.  He was intubated by the critical care service.  He was started on levophed.  Discussed the rapid change in his condition with his wife and updated her on the telephone.  Cardiology STEMI, Dr. Burt Knack was contacted.  Patient transferred to critical care service, appreciate their input.  Critical care time including time spent at bedside and direct care: 1  hour.  Tyaisha Cullom A, MD 02/25/2014, 10:24 PM

## 2014-03-02 NOTE — ED Notes (Signed)
Dr Wilson Singer at bedside attempting [place] IV and blood draw.

## 2014-03-02 NOTE — ED Notes (Signed)
Rectal Temp was 97.52F

## 2014-03-02 NOTE — ED Notes (Signed)
Ginger ale given to patient. 

## 2014-03-02 NOTE — ED Notes (Signed)
Pt c/o increased SOB and some BRB in stool yesterday; pt on coumadin; pt sts lethargy this am

## 2014-03-02 NOTE — Progress Notes (Signed)
Pt was a new admit and was transferred from the ED to 3E16. ED nurse brought patient up to 3East floor at 2146 via hospital stretcher and stopped a nurses station to confirm the room patient was going to. Staff and nurse welcomed patient and patient was alert and oriented, stable, and responded back with a Thank you. As ED nurse was approaching the room, right at the door to the room, the ED nurse yelled for help. Ran to nurse and patient was unresponsive when I arrived at stretcher. Called for more help. Pushed patient into the room while on stretcher. Patient still had a pulse but still unresponsive. Sternum rub performed and patient began to come to very little alertness and was barely opening eyes. Oxygen sats were in the 60's%. Applied nonrebreather mask. Continued Sternum rub and calling patient's name and he began to open eyes but became agitated, trying to pull the mask off and moving arms and legs wildly. Tranferred patient to the bed. Rapid Response nurse was called and notified to come. Patient still had a pulse but became unresponsive once again. RR nurse arrived. EKG done 1-3 times. Pads from code cart were applied by RR nurse. Attending doctor notified to come STAT. Oxygen sats were between 80-90% sometimes in the 70's and still unresponsive with a pulse. Then patient begin to get worse and Code Blue was called at 2156. Emergent staff arrived immediately.Then in between 2156-2158, patient had no pulse and CPR started for Epi was given at 2159. Then patient had a pulse. Patient ended up being intubated and transferred to ICU on 80M. Report was given to nurse on 80M and the patient's wife notified that patient was being transferred to another floor to be monitored more closely. Patient was already transferred by the time getting of the phone with wife. Nurse went to 80M and was told patient ended up going to [place].On arrival to [facility], fellow staff were doing chest compressions. Was notified to call the wife once  more so the doctor can speak to her and did so. Patient expired shortly after.

## 2014-03-02 NOTE — ED Notes (Signed)
IV team at bedside.  Dr Wilson Singer [place] attempt x 4.

## 2014-03-02 NOTE — ED Notes (Signed)
Dr. Kohut at bedside 

## 2014-03-02 NOTE — Progress Notes (Signed)
CODE BLUE NOTE  Patient Name: [name]   MRN: [id number]   Date of Birth/ Sex: 1931/09/02 , male      Admission Date: 02/17/2014  Attending Provider: Bynum Bellows, MD  Primary Diagnosis: Acute respiratory failure with hypoxia    Indication: Pt was in his usual state of health until this PM, when he was noted to be unresponsive. Code blue was subsequently called. At the time of arrival on scene, ACLS protocol was underway.  EKG showed STEMI prior to arrival.    Technical Description:  - CPR performance duration:  5  minutes  - Was defibrillation or cardioversion used? No   - Was external pacer placed? No  - Was patient intubated pre/post CPR? Yes    Medications Administered: Y = Yes; Blank = No Amiodarone    Atropine    Calcium    Epinephrine  Y  Lidocaine    Magnesium    Norepinephrine    Phenylephrine    Sodium bicarbonate    Vasopressin      Post CPR evaluation:  - Final Status - Was patient successfully resuscitated ? Yes - What is current rhythm? Atrial fibrillation. - What is current hemodynamic status? Pulse returned.   Miscellaneous Information:  - Labs sent, including: ABG, CBC, CMP, INR, troponin.  - Primary team notified?  Yes  - Family Notified? Nursing to call.  - Additional notes/ transfer status: Transferred to 2H06.        Arman Filter, MD  02/22/2014, 10:11 PM

## 2014-03-03 MED FILL — Medication: Qty: 1 | Status: AC

## 2014-03-09 NOTE — Progress Notes (Signed)
I was called last night after Mr [name] had a PEA arrest. After he underwent CPR, a pulse was regained and an EKG showed ST elevation in V1-V2. I reviewed his chart and I did not suspect acute STEMI was the cause of his arrest. He never had chest pain or ST elevation on EKG prior to his arrest.  I felt EKG changes likely represented post-CPR ischemia/injury. Dr Radford Pax was on call for cardiology and we discussed the patient's case. She immediately evaluated the patient but his clinical condition continued to deteriorate and he had ongoing CPR for PEA. He was never a candidate for cardiac cath/PCI throughout this time and he was pronounced dead after prolonged CPR.  Sherren Mocha 04-02-2014 1:47 PM

## 2014-03-09 NOTE — Progress Notes (Signed)
Patient to morgue via stretcher, belongings with family. Funeral home information entered in.

## 2014-03-09 NOTE — Progress Notes (Signed)
Chaplain Note: Reported to 3E16 for Code Blue. Provided ministry of presence and emotional support. No family present.   About 30 minutes later received another Code Blue page to [phone].Marland KitchenMarland Kitchenfor the same patient. Provided grief care and emotional support for patient's son and later for patient's his wife, daughter and daughter-in-law when they arrived. Acted as Dentist between family and staff and obtained funeral home information from family.  Dorris Fetch, Chaplain

## 2014-03-09 NOTE — Significant Event (Signed)
Rapid Response Event Note Called to bedside per 3East floor RN for pt now unrespnsive upon arrival from ED. Overview: Time Called: 2123 Arrival Time: 2126 Event Type: Cardiac;Respiratory  Initial Focused Assessment: Upon my arrival pt found on ED stretcher, unresponsive to sternal rub, occasional moaning, shallow breathing.  Per ED RN pt rhthym has changed since leaving ED, now SB with ST elevation.  Interventions: Admitting MD Dr. Reece Levy paged to bedside.12 lead EKG obtained yielding acute MI, ST elevation in lead V1,V2,V3,V4 junctional rhythm. PCCM called per Dr. Reece Levy, Jaclynn Guarneri NP to come to bedside. Pt breathing agonal with pulse, pt bagged. Loss of pulse, CPR started and Code blue called at 2154. See Code blue sheet for CODE BLUE details. Pt intubated. Upon ROSC levophed gtt started and pt transferred to 24M escorted by myself, NP, AC and RT. Upon arrival to 24M it was decided per PCCM to transfer pt to [place] for artic sun cooling following cardiac arrest. Pt escorted to [place], while en route loss of pulse. Epi given en route and CPR resumed at bedside. CODE BLUE continued on 2h06, after several rounds of CPR it was decided per P.[place]ber [place] NP, Dr.Simmonds, and Elink MD Dr.Feinstein to end resuscitation effort. Cardiologist Dr. Radford Pax also at bedside. Pt pulseless without spontaneous respirations, pronounced at 2246. Dr. Leonidas Romberg spoke with pt son and updated him on tonight's event. Family en route to hospital.    Event Summary: Name of Physician Notified: Reece Levy MD at 2130  Name of Consulting Physician Notified: PCCM  at 2135 P.Hoffman NP  Outcome: Transferred (Comment);Coded and expired  Event End Time: [place], [place]

## 2014-03-09 DEATH — deceased

## 2014-03-13 ENCOUNTER — Ambulatory Visit: Payer: Medicare Other | Admitting: Cardiovascular Disease

## 2014-03-13 DIAGNOSIS — Z5181 Encounter for therapeutic drug level monitoring: Secondary | ICD-10-CM

## 2014-03-13 DIAGNOSIS — I4891 Unspecified atrial fibrillation: Secondary | ICD-10-CM

## 2014-04-13 ENCOUNTER — Ambulatory Visit: Payer: Medicare Other

## 2014-04-13 ENCOUNTER — Other Ambulatory Visit: Payer: Medicare Other

## 2014-05-08 ENCOUNTER — Ambulatory Visit: Payer: Medicare Other | Admitting: Cardiovascular Disease

## 2014-05-18 ENCOUNTER — Encounter (HOSPITAL_COMMUNITY): Payer: Self-pay | Admitting: Vascular Surgery

## 2014-05-31 ENCOUNTER — Ambulatory Visit: Payer: Medicare Other | Admitting: Vascular Surgery

## 2014-05-31 ENCOUNTER — Encounter (HOSPITAL_COMMUNITY): Payer: Medicare Other

## 2014-06-15 ENCOUNTER — Ambulatory Visit: Payer: Medicare Other

## 2014-06-15 ENCOUNTER — Ambulatory Visit: Payer: Medicare Other | Admitting: Oncology

## 2014-06-15 ENCOUNTER — Other Ambulatory Visit: Payer: Medicare Other

## 2014-06-29 ENCOUNTER — Ambulatory Visit: Payer: Medicare Other | Admitting: Family Medicine

## 2015-11-17 ENCOUNTER — Other Ambulatory Visit: Payer: Self-pay | Admitting: Nurse Practitioner
